# Patient Record
Sex: Male | Born: 1935 | Race: White | Hispanic: No | Marital: Married | Smoking: Current some day smoker
Health system: Southern US, Community
[De-identification: ages and names within clinical notes are randomized; demographics above are authoritative.]

## PROBLEM LIST (undated history)

## (undated) DIAGNOSIS — I8390 Asymptomatic varicose veins of unspecified lower extremity: Secondary | ICD-10-CM

## (undated) DIAGNOSIS — M1990 Unspecified osteoarthritis, unspecified site: Secondary | ICD-10-CM

## (undated) HISTORY — DX: Asymptomatic varicose veins of unspecified lower extremity: I83.90

## (undated) HISTORY — DX: Unspecified osteoarthritis, unspecified site: M19.90

## (undated) HISTORY — PX: INNER EAR SURGERY: SHX679

## (undated) HISTORY — PX: SQUAMOUS CELL CARCINOMA EXCISION: SHX2433

---

## 2000-11-09 ENCOUNTER — Ambulatory Visit (HOSPITAL_COMMUNITY): Admission: RE | Admit: 2000-11-09 | Discharge: 2000-11-09 | Payer: Self-pay | Admitting: Gastroenterology

## 2000-11-09 ENCOUNTER — Encounter (INDEPENDENT_AMBULATORY_CARE_PROVIDER_SITE_OTHER): Payer: Self-pay | Admitting: Specialist

## 2004-07-05 ENCOUNTER — Ambulatory Visit (HOSPITAL_COMMUNITY): Admission: RE | Admit: 2004-07-05 | Discharge: 2004-07-05 | Payer: Self-pay | Admitting: Gastroenterology

## 2007-02-25 ENCOUNTER — Encounter: Admission: RE | Admit: 2007-02-25 | Discharge: 2007-02-25 | Payer: Self-pay | Admitting: Internal Medicine

## 2007-04-02 ENCOUNTER — Encounter: Admission: RE | Admit: 2007-04-02 | Discharge: 2007-04-02 | Payer: Self-pay | Admitting: Internal Medicine

## 2007-04-16 ENCOUNTER — Encounter: Admission: RE | Admit: 2007-04-16 | Discharge: 2007-04-16 | Payer: Self-pay | Admitting: Internal Medicine

## 2007-04-30 ENCOUNTER — Encounter: Admission: RE | Admit: 2007-04-30 | Discharge: 2007-04-30 | Payer: Self-pay | Admitting: Internal Medicine

## 2007-06-23 ENCOUNTER — Ambulatory Visit (HOSPITAL_COMMUNITY): Admission: RE | Admit: 2007-06-23 | Discharge: 2007-06-23 | Payer: Self-pay | Admitting: Neurosurgery

## 2011-01-16 ENCOUNTER — Other Ambulatory Visit: Payer: Self-pay | Admitting: Dermatology

## 2011-04-11 NOTE — Op Note (Signed)
NAME:  Daniel Moses, Daniel Moses                            ACCOUNT NO.:  [id number]   MEDICAL RECORD NO.:  [id number]                   PATIENT TYPE:  AMB   LOCATION:  [facility]                                 FACILITY:  [facility]   PHYSICIAN:  Bernette Redbird, M.D.                DATE OF BIRTH:  [birth year]   DATE OF PROCEDURE:  07/05/2004  DATE OF DISCHARGE:                                 OPERATIVE REPORT   PROCEDURE:  Colonoscopy.   ENDOSCOPIST:  Bernette Redbird, M.D.   INDICATIONS FOR PROCEDURE:  This is a 77 year old with a prior history of  colonic adenoma having been removed several years ago.   FINDINGS:  Normal exam.   DESCRIPTION OF PROCEDURE:  The nature, purpose and risks of the procedure  were familiar to the patient from prior examination and he provided written  consent.  Sedation was Fentanyl 25 mcg and Versed 5 mg IV without arrhythmia  or desaturation.  Digital exam of the prostate was normal. The Olympus adult  adjustable tension video colonoscope was advanced to the cecum using some  external abdominal compression to control looping. The cecum was identified  by clear visualization of the appendiceal orifice and pullback was then  performed.   The quality of the prep was excellent, so it is felt that all areas were  well seen.   This was a normal examination.  There was no evidence of any residual or  recurrent polyps nor any evidence of cancer, colitis, vascular malformations  or diverticulosis.  Retroflexion in the rectum was unremarkable as was re-  inspection of the rectum.   No biopsies were obtained.  The patient tolerated the procedure well and  there were no apparent complications.   IMPRESSION:  Prior history of colonic adenoma without abnormalities on  current examination (V12.72).   PLAN:  Follow-up colonoscopy in about five years.                                               Bernette Redbird, M.D.    RB/MEDQ  D:  07/05/2004  T:  07/05/2004  Job:  [id number]   cc:   Loraine Leriche A. Waynard Edwards, M.D.  [address]  [place]  [place] [address]  Fax: [phone]

## 2011-04-11 NOTE — Procedures (Signed)
[place]  Patient:    Daniel Moses, Daniel Moses                         MRN: [id number] Proc. Date: 11/09/00 Adm. Date:  [id number] Attending:  Rich Brave CC:         Georg Ruddle. Viviann Spare, M.D.  Rodrigo Ran, M.D.   Procedure Report  PROCEDURE:  Colonoscopy with biopsies.  ENDOSCOPIST:  Florencia Reasons, M.D.  INDICATIONS: A 77 year old for colon cancer screening.  FINDINGS:  Diminutive polyps above cecum, biopsied.  INFORMED CONSENT:  The nature, purpose, and risks of the procedure have been discussed with the patient who provided written consent.  SEDATION:  Fentanyl 30 mcg and Versed 5 mg IV without arrhythmias or desaturation.  On digital exam the prostate was unremarkable.  The Olympus Adult Video colonoscope was advanced through the terminal ileum applying a small amount of external abdominal compression to minimize looping.  The terminal ileum was entered for a very short distance and a brief glimpse of it was obtained and it looked normal.  Pullback was then performed.  The quality of the prep was excellent and it was felt that all areas were well seen.  There were a couple of tiny, hyperplastic appearing polyps in the proximal ascending colon a short distance above the cecum, each removed by 1 or 2 cold biopsies.  The remainder of the colon was normal without any large polyps, cancer, colitis, vascular malformations or diverticulosis and retroflexion of the rectum was normal.  The quality of the prep was excellent.  The patient tolerated the procedure well and there were no apparent complications.  IMPRESSION:  Normal exam except for diminutive hyperplastic-appearing polyps in the proximal ascending colon.  PLAN:  Await pathology on biopsies with consideration of followup colonoscope in 3-5 years depending on histologic findings. DD:  11/09/00 TD:  11/09/00 Job: [id number]

## 2011-05-21 ENCOUNTER — Other Ambulatory Visit: Payer: Self-pay | Admitting: Dermatology

## 2011-12-10 DIAGNOSIS — E785 Hyperlipidemia, unspecified: Secondary | ICD-10-CM | POA: Diagnosis not present

## 2011-12-10 DIAGNOSIS — Z79899 Other long term (current) drug therapy: Secondary | ICD-10-CM | POA: Diagnosis not present

## 2011-12-10 DIAGNOSIS — E119 Type 2 diabetes mellitus without complications: Secondary | ICD-10-CM | POA: Diagnosis not present

## 2011-12-10 DIAGNOSIS — I10 Essential (primary) hypertension: Secondary | ICD-10-CM | POA: Diagnosis not present

## 2011-12-10 DIAGNOSIS — Z125 Encounter for screening for malignant neoplasm of prostate: Secondary | ICD-10-CM | POA: Diagnosis not present

## 2011-12-17 DIAGNOSIS — Z125 Encounter for screening for malignant neoplasm of prostate: Secondary | ICD-10-CM | POA: Diagnosis not present

## 2011-12-17 DIAGNOSIS — Z0000 Encounter for general adult medical examination without abnormal findings: Secondary | ICD-10-CM | POA: Diagnosis not present

## 2011-12-17 DIAGNOSIS — I10 Essential (primary) hypertension: Secondary | ICD-10-CM | POA: Diagnosis not present

## 2011-12-17 DIAGNOSIS — E119 Type 2 diabetes mellitus without complications: Secondary | ICD-10-CM | POA: Diagnosis not present

## 2011-12-17 DIAGNOSIS — I440 Atrioventricular block, first degree: Secondary | ICD-10-CM | POA: Diagnosis not present

## 2011-12-19 DIAGNOSIS — Z1212 Encounter for screening for malignant neoplasm of rectum: Secondary | ICD-10-CM | POA: Diagnosis not present

## 2012-02-24 DIAGNOSIS — H40019 Open angle with borderline findings, low risk, unspecified eye: Secondary | ICD-10-CM | POA: Diagnosis not present

## 2012-08-23 DIAGNOSIS — H40019 Open angle with borderline findings, low risk, unspecified eye: Secondary | ICD-10-CM | POA: Diagnosis not present

## 2012-08-23 DIAGNOSIS — H52209 Unspecified astigmatism, unspecified eye: Secondary | ICD-10-CM | POA: Diagnosis not present

## 2012-09-16 DIAGNOSIS — M1710 Unilateral primary osteoarthritis, unspecified knee: Secondary | ICD-10-CM | POA: Diagnosis not present

## 2012-09-16 DIAGNOSIS — M169 Osteoarthritis of hip, unspecified: Secondary | ICD-10-CM | POA: Diagnosis not present

## 2012-11-12 DIAGNOSIS — M1710 Unilateral primary osteoarthritis, unspecified knee: Secondary | ICD-10-CM | POA: Diagnosis not present

## 2013-02-21 DIAGNOSIS — I10 Essential (primary) hypertension: Secondary | ICD-10-CM | POA: Diagnosis not present

## 2013-02-21 DIAGNOSIS — Z125 Encounter for screening for malignant neoplasm of prostate: Secondary | ICD-10-CM | POA: Diagnosis not present

## 2013-02-21 DIAGNOSIS — H40019 Open angle with borderline findings, low risk, unspecified eye: Secondary | ICD-10-CM | POA: Diagnosis not present

## 2013-02-21 DIAGNOSIS — Z79899 Other long term (current) drug therapy: Secondary | ICD-10-CM | POA: Diagnosis not present

## 2013-02-21 DIAGNOSIS — E119 Type 2 diabetes mellitus without complications: Secondary | ICD-10-CM | POA: Diagnosis not present

## 2013-02-21 DIAGNOSIS — E785 Hyperlipidemia, unspecified: Secondary | ICD-10-CM | POA: Diagnosis not present

## 2013-02-28 DIAGNOSIS — M1990 Unspecified osteoarthritis, unspecified site: Secondary | ICD-10-CM | POA: Diagnosis not present

## 2013-02-28 DIAGNOSIS — Z79899 Other long term (current) drug therapy: Secondary | ICD-10-CM | POA: Diagnosis not present

## 2013-02-28 DIAGNOSIS — Z0000 Encounter for general adult medical examination without abnormal findings: Secondary | ICD-10-CM | POA: Diagnosis not present

## 2013-02-28 DIAGNOSIS — Z125 Encounter for screening for malignant neoplasm of prostate: Secondary | ICD-10-CM | POA: Diagnosis not present

## 2013-02-28 DIAGNOSIS — Z23 Encounter for immunization: Secondary | ICD-10-CM | POA: Diagnosis not present

## 2013-02-28 DIAGNOSIS — Z1331 Encounter for screening for depression: Secondary | ICD-10-CM | POA: Diagnosis not present

## 2013-02-28 DIAGNOSIS — I440 Atrioventricular block, first degree: Secondary | ICD-10-CM | POA: Diagnosis not present

## 2013-02-28 DIAGNOSIS — D126 Benign neoplasm of colon, unspecified: Secondary | ICD-10-CM | POA: Diagnosis not present

## 2013-02-28 DIAGNOSIS — IMO0002 Reserved for concepts with insufficient information to code with codable children: Secondary | ICD-10-CM | POA: Diagnosis not present

## 2013-02-28 DIAGNOSIS — E669 Obesity, unspecified: Secondary | ICD-10-CM | POA: Diagnosis not present

## 2013-02-28 DIAGNOSIS — E119 Type 2 diabetes mellitus without complications: Secondary | ICD-10-CM | POA: Diagnosis not present

## 2013-02-28 DIAGNOSIS — E785 Hyperlipidemia, unspecified: Secondary | ICD-10-CM | POA: Diagnosis not present

## 2013-03-02 DIAGNOSIS — M1710 Unilateral primary osteoarthritis, unspecified knee: Secondary | ICD-10-CM | POA: Diagnosis not present

## 2013-03-15 DIAGNOSIS — H6120 Impacted cerumen, unspecified ear: Secondary | ICD-10-CM | POA: Diagnosis not present

## 2013-04-05 DIAGNOSIS — M1710 Unilateral primary osteoarthritis, unspecified knee: Secondary | ICD-10-CM | POA: Diagnosis not present

## 2013-04-21 DIAGNOSIS — M1710 Unilateral primary osteoarthritis, unspecified knee: Secondary | ICD-10-CM | POA: Diagnosis not present

## 2013-04-27 DIAGNOSIS — M1710 Unilateral primary osteoarthritis, unspecified knee: Secondary | ICD-10-CM | POA: Diagnosis not present

## 2013-05-04 DIAGNOSIS — M1710 Unilateral primary osteoarthritis, unspecified knee: Secondary | ICD-10-CM | POA: Diagnosis not present

## 2013-06-16 DIAGNOSIS — M1710 Unilateral primary osteoarthritis, unspecified knee: Secondary | ICD-10-CM | POA: Diagnosis not present

## 2013-08-02 ENCOUNTER — Encounter: Payer: Self-pay | Admitting: Vascular Surgery

## 2013-08-03 ENCOUNTER — Ambulatory Visit (INDEPENDENT_AMBULATORY_CARE_PROVIDER_SITE_OTHER): Payer: Medicare Other | Admitting: Vascular Surgery

## 2013-08-03 ENCOUNTER — Encounter: Payer: Self-pay | Admitting: Vascular Surgery

## 2013-08-03 ENCOUNTER — Encounter (INDEPENDENT_AMBULATORY_CARE_PROVIDER_SITE_OTHER): Payer: Medicare Other | Admitting: Vascular Surgery

## 2013-08-03 ENCOUNTER — Other Ambulatory Visit: Payer: Self-pay | Admitting: *Deleted

## 2013-08-03 VITALS — BP 141/75 | HR 51 | Resp 18 | Ht 70.0 in | Wt 198.3 lb

## 2013-08-03 DIAGNOSIS — I83893 Varicose veins of bilateral lower extremities with other complications: Secondary | ICD-10-CM

## 2013-08-03 DIAGNOSIS — M79609 Pain in unspecified limb: Secondary | ICD-10-CM

## 2013-08-03 HISTORY — DX: Varicose veins of bilateral lower extremities with other complications: I83.893

## 2013-08-03 NOTE — Progress Notes (Signed)
Vascular and Vein Specialist of [place]  Patient name: Daniel Moses MRN: [id number] DOB: Oct 20, 1934 Sex: male  REASON FOR CONSULT: bilateral varicose veins.  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 He has a long history of aching pain in both lower extremities associated with sitting and standing. He also has some problems with swelling associated with his varicosities. He is unaware of any history of DVT or phlebitis in the past. Since his retirement, he was noted that his symptoms have become more prominent. He has worn knee-high compression stockings which didn't help his symptoms on the left leg especially.  Past Medical History  Diagnosis Date  . Varicose veins   . Arthritis     back and knees   Family History  Problem Relation Age of Onset  . Hypertension Mother   . Other Mother     varicose veins  . Cancer Father    SOCIAL HISTORY: History  Substance Use Topics  . Smoking status: Current Some Day Smoker    Types: Cigars  . Smokeless tobacco: Never Used  . Alcohol Use: No   No Known Allergies  Current Outpatient Prescriptions  Medication Sig Dispense Refill  . aspirin 81 MG tablet Take 81 mg by mouth daily.      . finasteride (PROSCAR) 5 MG tablet Take 5 mg by mouth daily.      . naproxen sodium (ANAPROX) 220 MG tablet Take 220 mg by mouth as needed.      . simvastatin (ZOCOR) 40 MG tablet Take 40 mg by mouth every evening.      . tamsulosin (FLOMAX) 0.4 MG CAPS capsule Take by mouth.       No current facility-administered medications for this visit.   REVIEW OF SYSTEMS: Arly.Keller ] denotes positive finding; [  ] denotes negative finding  CARDIOVASCULAR:  [ ]  chest pain   [ ]  chest pressure   [ ]  palpitations   [ ]  orthopnea   [ ]  dyspnea on exertion   [ ]  claudication   [ ]  rest pain   [ ]  DVT   [ ]  phlebitis PULMONARY:   [ ]  productive cough    [ ]  asthma   [ ]  wheezing NEUROLOGIC:   [ ]  weakness  [ ]  paresthesias  [ ]  aphasia  [ ]  amaurosis  [ ]  dizziness HEMATOLOGIC:   [ ]  bleeding problems   [ ]  clotting disorders MUSCULOSKELETAL:  [ ]  joint pain   [ ]  joint swelling Arly.Keller ] leg swelling GASTROINTESTINAL: [ ]   blood in stool  [ ]   hematemesis GENITOURINARY:  [ ]   dysuria  [ ]   hematuria PSYCHIATRIC:  [ ]  history of major depression INTEGUMENTARY:  [ ]  rashes  [ ]  ulcers CONSTITUTIONAL:  [ ]  fever   [ ]  chills  PHYSICAL EXAM: Filed Vitals:   08/03/13 1219  BP: 141/75  Pulse: 51  Resp: 18  Height: 5\' 10"  (1.778 m)  Weight: 198 lb 4.8 oz (89.948 kg)   Body mass index is 28.45 kg/(m^2). GENERAL: The patient is a well-nourished male, in no acute distress. The vital signs are documented above. CARDIOVASCULAR: There is a regular rate and rhythm. I do not detect carotid bruits. He has palpable pedal pulses. PULMONARY: There is good air exchange bilaterally without wheezing or rales. ABDOMEN: Soft and non-tender  with normal pitched bowel sounds.  MUSCULOSKELETAL: There are no major deformities or cyanosis. NEUROLOGIC: No focal weakness or paresthesias are detected. SKIN: There are no ulcers or rashes noted. He has some telangiectasias bilaterally. PSYCHIATRIC: The patient has a normal affect.  DATA:  I have independently interpreted his venous duplex scan. On the right side, he does have some deep vein reflux in the superficial femoral vein and popliteal vein. He also has some reflux in the greater saphenous vein in the right side. On the left side, he does have a couple areas of venous reflux in the saphenous vein. There is no deep vein reflux on the left. There is no evidence of DVT in either lower extremity.  MEDICAL ISSUES: This patient has bilateral lower extremity varicose veins which are symptomatic. He does wear knee-high compression stockings which have helped. We have also discussed the importance of intermittent leg  elevation. I've encouraged him to stay as active as possible and to avoid prolonged sitting and standing. If his symptoms progress he will let us know we can try a thigh high compression stockings to see if this makes a difference. If his symptoms progress he could potentially be considered for laser ablated of his right greater saphenous vein.  Keitra Carusone S Vascular [facility] Vein Specialists of  [place]: [phone]

## 2013-08-11 DIAGNOSIS — L570 Actinic keratosis: Secondary | ICD-10-CM | POA: Diagnosis not present

## 2013-08-11 DIAGNOSIS — M7130 Other bursal cyst, unspecified site: Secondary | ICD-10-CM | POA: Diagnosis not present

## 2013-08-11 DIAGNOSIS — B351 Tinea unguium: Secondary | ICD-10-CM | POA: Diagnosis not present

## 2013-08-15 DIAGNOSIS — Z23 Encounter for immunization: Secondary | ICD-10-CM | POA: Diagnosis not present

## 2013-09-09 DIAGNOSIS — B351 Tinea unguium: Secondary | ICD-10-CM | POA: Diagnosis not present

## 2013-09-09 DIAGNOSIS — Z79899 Other long term (current) drug therapy: Secondary | ICD-10-CM | POA: Diagnosis not present

## 2013-11-04 DIAGNOSIS — H52209 Unspecified astigmatism, unspecified eye: Secondary | ICD-10-CM | POA: Diagnosis not present

## 2013-11-04 DIAGNOSIS — H2510 Age-related nuclear cataract, unspecified eye: Secondary | ICD-10-CM | POA: Diagnosis not present

## 2013-11-04 DIAGNOSIS — H40019 Open angle with borderline findings, low risk, unspecified eye: Secondary | ICD-10-CM | POA: Diagnosis not present

## 2014-02-07 DIAGNOSIS — D235 Other benign neoplasm of skin of trunk: Secondary | ICD-10-CM | POA: Diagnosis not present

## 2014-02-07 DIAGNOSIS — L819 Disorder of pigmentation, unspecified: Secondary | ICD-10-CM | POA: Diagnosis not present

## 2014-02-07 DIAGNOSIS — L820 Inflamed seborrheic keratosis: Secondary | ICD-10-CM | POA: Diagnosis not present

## 2014-02-07 DIAGNOSIS — L570 Actinic keratosis: Secondary | ICD-10-CM | POA: Diagnosis not present

## 2014-02-07 DIAGNOSIS — D1801 Hemangioma of skin and subcutaneous tissue: Secondary | ICD-10-CM | POA: Diagnosis not present

## 2014-03-07 DIAGNOSIS — I10 Essential (primary) hypertension: Secondary | ICD-10-CM | POA: Diagnosis not present

## 2014-03-07 DIAGNOSIS — E785 Hyperlipidemia, unspecified: Secondary | ICD-10-CM | POA: Diagnosis not present

## 2014-03-07 DIAGNOSIS — E119 Type 2 diabetes mellitus without complications: Secondary | ICD-10-CM | POA: Diagnosis not present

## 2014-03-07 DIAGNOSIS — Z125 Encounter for screening for malignant neoplasm of prostate: Secondary | ICD-10-CM | POA: Diagnosis not present

## 2014-03-14 DIAGNOSIS — Z0000 Encounter for general adult medical examination without abnormal findings: Secondary | ICD-10-CM | POA: Diagnosis not present

## 2014-03-14 DIAGNOSIS — J301 Allergic rhinitis due to pollen: Secondary | ICD-10-CM | POA: Diagnosis not present

## 2014-03-14 DIAGNOSIS — E119 Type 2 diabetes mellitus without complications: Secondary | ICD-10-CM | POA: Diagnosis not present

## 2014-03-14 DIAGNOSIS — M1990 Unspecified osteoarthritis, unspecified site: Secondary | ICD-10-CM | POA: Diagnosis not present

## 2014-03-14 DIAGNOSIS — Z125 Encounter for screening for malignant neoplasm of prostate: Secondary | ICD-10-CM | POA: Diagnosis not present

## 2014-03-14 DIAGNOSIS — E669 Obesity, unspecified: Secondary | ICD-10-CM | POA: Diagnosis not present

## 2014-03-14 DIAGNOSIS — IMO0002 Reserved for concepts with insufficient information to code with codable children: Secondary | ICD-10-CM | POA: Diagnosis not present

## 2014-03-14 DIAGNOSIS — I440 Atrioventricular block, first degree: Secondary | ICD-10-CM | POA: Diagnosis not present

## 2014-03-14 DIAGNOSIS — E785 Hyperlipidemia, unspecified: Secondary | ICD-10-CM | POA: Diagnosis not present

## 2014-03-14 DIAGNOSIS — Z6827 Body mass index (BMI) 27.0-27.9, adult: Secondary | ICD-10-CM | POA: Diagnosis not present

## 2014-08-23 DIAGNOSIS — Z23 Encounter for immunization: Secondary | ICD-10-CM | POA: Diagnosis not present

## 2014-09-05 DIAGNOSIS — L821 Other seborrheic keratosis: Secondary | ICD-10-CM | POA: Diagnosis not present

## 2014-09-05 DIAGNOSIS — L570 Actinic keratosis: Secondary | ICD-10-CM | POA: Diagnosis not present

## 2014-10-03 DIAGNOSIS — H40013 Open angle with borderline findings, low risk, bilateral: Secondary | ICD-10-CM | POA: Diagnosis not present

## 2014-10-03 DIAGNOSIS — H5213 Myopia, bilateral: Secondary | ICD-10-CM | POA: Diagnosis not present

## 2014-11-13 DIAGNOSIS — C44622 Squamous cell carcinoma of skin of right upper limb, including shoulder: Secondary | ICD-10-CM | POA: Diagnosis not present

## 2014-11-13 DIAGNOSIS — C44692 Other specified malignant neoplasm of skin of right upper limb, including shoulder: Secondary | ICD-10-CM | POA: Diagnosis not present

## 2015-01-24 DIAGNOSIS — Z08 Encounter for follow-up examination after completed treatment for malignant neoplasm: Secondary | ICD-10-CM | POA: Diagnosis not present

## 2015-01-24 DIAGNOSIS — L570 Actinic keratosis: Secondary | ICD-10-CM | POA: Diagnosis not present

## 2015-01-24 DIAGNOSIS — Z85828 Personal history of other malignant neoplasm of skin: Secondary | ICD-10-CM | POA: Diagnosis not present

## 2015-03-16 DIAGNOSIS — E785 Hyperlipidemia, unspecified: Secondary | ICD-10-CM | POA: Diagnosis not present

## 2015-03-16 DIAGNOSIS — E119 Type 2 diabetes mellitus without complications: Secondary | ICD-10-CM | POA: Diagnosis not present

## 2015-03-16 DIAGNOSIS — I10 Essential (primary) hypertension: Secondary | ICD-10-CM | POA: Diagnosis not present

## 2015-03-16 DIAGNOSIS — Z125 Encounter for screening for malignant neoplasm of prostate: Secondary | ICD-10-CM | POA: Diagnosis not present

## 2015-04-12 DIAGNOSIS — Z1212 Encounter for screening for malignant neoplasm of rectum: Secondary | ICD-10-CM | POA: Diagnosis not present

## 2015-06-14 DIAGNOSIS — L570 Actinic keratosis: Secondary | ICD-10-CM | POA: Diagnosis not present

## 2015-06-15 DIAGNOSIS — M1712 Unilateral primary osteoarthritis, left knee: Secondary | ICD-10-CM | POA: Diagnosis not present

## 2015-06-21 DIAGNOSIS — Z09 Encounter for follow-up examination after completed treatment for conditions other than malignant neoplasm: Secondary | ICD-10-CM | POA: Diagnosis not present

## 2015-06-21 DIAGNOSIS — K5730 Diverticulosis of large intestine without perforation or abscess without bleeding: Secondary | ICD-10-CM | POA: Diagnosis not present

## 2015-06-21 DIAGNOSIS — Z86010 Personal history of colonic polyps: Secondary | ICD-10-CM | POA: Diagnosis not present

## 2015-07-04 DIAGNOSIS — Z6828 Body mass index (BMI) 28.0-28.9, adult: Secondary | ICD-10-CM | POA: Diagnosis not present

## 2015-07-04 DIAGNOSIS — Z0000 Encounter for general adult medical examination without abnormal findings: Secondary | ICD-10-CM | POA: Diagnosis not present

## 2015-07-04 DIAGNOSIS — D692 Other nonthrombocytopenic purpura: Secondary | ICD-10-CM | POA: Diagnosis not present

## 2015-07-04 DIAGNOSIS — M5380 Other specified dorsopathies, site unspecified: Secondary | ICD-10-CM | POA: Diagnosis not present

## 2015-07-04 DIAGNOSIS — H6123 Impacted cerumen, bilateral: Secondary | ICD-10-CM | POA: Diagnosis not present

## 2015-07-04 DIAGNOSIS — M25569 Pain in unspecified knee: Secondary | ICD-10-CM | POA: Diagnosis not present

## 2015-07-04 DIAGNOSIS — E119 Type 2 diabetes mellitus without complications: Secondary | ICD-10-CM | POA: Diagnosis not present

## 2015-07-04 DIAGNOSIS — M1990 Unspecified osteoarthritis, unspecified site: Secondary | ICD-10-CM | POA: Diagnosis not present

## 2015-07-04 DIAGNOSIS — I440 Atrioventricular block, first degree: Secondary | ICD-10-CM | POA: Diagnosis not present

## 2015-07-04 DIAGNOSIS — E785 Hyperlipidemia, unspecified: Secondary | ICD-10-CM | POA: Diagnosis not present

## 2015-07-04 DIAGNOSIS — I10 Essential (primary) hypertension: Secondary | ICD-10-CM | POA: Diagnosis not present

## 2015-07-04 DIAGNOSIS — Z1389 Encounter for screening for other disorder: Secondary | ICD-10-CM | POA: Diagnosis not present

## 2015-08-01 DIAGNOSIS — M1712 Unilateral primary osteoarthritis, left knee: Secondary | ICD-10-CM | POA: Diagnosis not present

## 2015-08-08 DIAGNOSIS — M1712 Unilateral primary osteoarthritis, left knee: Secondary | ICD-10-CM | POA: Diagnosis not present

## 2015-08-15 DIAGNOSIS — Z23 Encounter for immunization: Secondary | ICD-10-CM | POA: Diagnosis not present

## 2015-08-17 DIAGNOSIS — M1712 Unilateral primary osteoarthritis, left knee: Secondary | ICD-10-CM | POA: Diagnosis not present

## 2015-11-06 DIAGNOSIS — L570 Actinic keratosis: Secondary | ICD-10-CM | POA: Diagnosis not present

## 2015-11-06 DIAGNOSIS — I788 Other diseases of capillaries: Secondary | ICD-10-CM | POA: Diagnosis not present

## 2015-12-25 DIAGNOSIS — H40012 Open angle with borderline findings, low risk, left eye: Secondary | ICD-10-CM | POA: Diagnosis not present

## 2015-12-25 DIAGNOSIS — H40011 Open angle with borderline findings, low risk, right eye: Secondary | ICD-10-CM | POA: Diagnosis not present

## 2015-12-25 DIAGNOSIS — H2513 Age-related nuclear cataract, bilateral: Secondary | ICD-10-CM | POA: Diagnosis not present

## 2016-01-23 DIAGNOSIS — L820 Inflamed seborrheic keratosis: Secondary | ICD-10-CM | POA: Diagnosis not present

## 2016-01-23 DIAGNOSIS — L821 Other seborrheic keratosis: Secondary | ICD-10-CM | POA: Diagnosis not present

## 2016-06-20 DIAGNOSIS — L820 Inflamed seborrheic keratosis: Secondary | ICD-10-CM | POA: Diagnosis not present

## 2016-06-20 DIAGNOSIS — L570 Actinic keratosis: Secondary | ICD-10-CM | POA: Diagnosis not present

## 2016-06-20 DIAGNOSIS — E119 Type 2 diabetes mellitus without complications: Secondary | ICD-10-CM | POA: Diagnosis not present

## 2016-07-22 DIAGNOSIS — I10 Essential (primary) hypertension: Secondary | ICD-10-CM | POA: Diagnosis not present

## 2016-07-22 DIAGNOSIS — E119 Type 2 diabetes mellitus without complications: Secondary | ICD-10-CM | POA: Diagnosis not present

## 2016-07-22 DIAGNOSIS — Z125 Encounter for screening for malignant neoplasm of prostate: Secondary | ICD-10-CM | POA: Diagnosis not present

## 2016-07-22 DIAGNOSIS — E784 Other hyperlipidemia: Secondary | ICD-10-CM | POA: Diagnosis not present

## 2016-07-29 DIAGNOSIS — E784 Other hyperlipidemia: Secondary | ICD-10-CM | POA: Diagnosis not present

## 2016-07-29 DIAGNOSIS — M25561 Pain in right knee: Secondary | ICD-10-CM | POA: Diagnosis not present

## 2016-07-29 DIAGNOSIS — H6123 Impacted cerumen, bilateral: Secondary | ICD-10-CM | POA: Diagnosis not present

## 2016-07-29 DIAGNOSIS — Z6829 Body mass index (BMI) 29.0-29.9, adult: Secondary | ICD-10-CM | POA: Diagnosis not present

## 2016-07-29 DIAGNOSIS — M25562 Pain in left knee: Secondary | ICD-10-CM | POA: Diagnosis not present

## 2016-07-29 DIAGNOSIS — I10 Essential (primary) hypertension: Secondary | ICD-10-CM | POA: Diagnosis not present

## 2016-07-29 DIAGNOSIS — Z1389 Encounter for screening for other disorder: Secondary | ICD-10-CM | POA: Diagnosis not present

## 2016-07-29 DIAGNOSIS — Z0000 Encounter for general adult medical examination without abnormal findings: Secondary | ICD-10-CM | POA: Diagnosis not present

## 2016-07-29 DIAGNOSIS — I440 Atrioventricular block, first degree: Secondary | ICD-10-CM | POA: Diagnosis not present

## 2016-07-29 DIAGNOSIS — E668 Other obesity: Secondary | ICD-10-CM | POA: Diagnosis not present

## 2016-07-29 DIAGNOSIS — N401 Enlarged prostate with lower urinary tract symptoms: Secondary | ICD-10-CM | POA: Diagnosis not present

## 2016-07-29 DIAGNOSIS — E119 Type 2 diabetes mellitus without complications: Secondary | ICD-10-CM | POA: Diagnosis not present

## 2016-08-01 DIAGNOSIS — C44722 Squamous cell carcinoma of skin of right lower limb, including hip: Secondary | ICD-10-CM | POA: Diagnosis not present

## 2016-08-01 DIAGNOSIS — D485 Neoplasm of uncertain behavior of skin: Secondary | ICD-10-CM | POA: Diagnosis not present

## 2016-08-01 DIAGNOSIS — L821 Other seborrheic keratosis: Secondary | ICD-10-CM | POA: Diagnosis not present

## 2016-08-05 DIAGNOSIS — C44722 Squamous cell carcinoma of skin of right lower limb, including hip: Secondary | ICD-10-CM | POA: Diagnosis not present

## 2017-01-28 ENCOUNTER — Other Ambulatory Visit: Payer: Self-pay | Admitting: Dermatology

## 2017-01-28 DIAGNOSIS — C44519 Basal cell carcinoma of skin of other part of trunk: Secondary | ICD-10-CM | POA: Diagnosis not present

## 2017-01-28 DIAGNOSIS — D485 Neoplasm of uncertain behavior of skin: Secondary | ICD-10-CM | POA: Diagnosis not present

## 2017-01-28 DIAGNOSIS — C44619 Basal cell carcinoma of skin of left upper limb, including shoulder: Secondary | ICD-10-CM | POA: Diagnosis not present

## 2017-01-28 DIAGNOSIS — D225 Melanocytic nevi of trunk: Secondary | ICD-10-CM | POA: Diagnosis not present

## 2017-01-28 DIAGNOSIS — L570 Actinic keratosis: Secondary | ICD-10-CM | POA: Diagnosis not present

## 2017-01-28 DIAGNOSIS — Z85828 Personal history of other malignant neoplasm of skin: Secondary | ICD-10-CM | POA: Diagnosis not present

## 2017-01-28 DIAGNOSIS — L821 Other seborrheic keratosis: Secondary | ICD-10-CM | POA: Diagnosis not present

## 2017-01-28 DIAGNOSIS — L814 Other melanin hyperpigmentation: Secondary | ICD-10-CM | POA: Diagnosis not present

## 2017-03-18 DIAGNOSIS — M1712 Unilateral primary osteoarthritis, left knee: Secondary | ICD-10-CM | POA: Diagnosis not present

## 2017-04-29 DIAGNOSIS — M1712 Unilateral primary osteoarthritis, left knee: Secondary | ICD-10-CM | POA: Diagnosis not present

## 2017-05-07 DIAGNOSIS — M1712 Unilateral primary osteoarthritis, left knee: Secondary | ICD-10-CM | POA: Diagnosis not present

## 2017-05-20 DIAGNOSIS — M1712 Unilateral primary osteoarthritis, left knee: Secondary | ICD-10-CM | POA: Diagnosis not present

## 2017-07-29 DIAGNOSIS — D225 Melanocytic nevi of trunk: Secondary | ICD-10-CM | POA: Diagnosis not present

## 2017-07-29 DIAGNOSIS — D1801 Hemangioma of skin and subcutaneous tissue: Secondary | ICD-10-CM | POA: Diagnosis not present

## 2017-07-29 DIAGNOSIS — L821 Other seborrheic keratosis: Secondary | ICD-10-CM | POA: Diagnosis not present

## 2017-07-29 DIAGNOSIS — Z85828 Personal history of other malignant neoplasm of skin: Secondary | ICD-10-CM | POA: Diagnosis not present

## 2017-07-29 DIAGNOSIS — L814 Other melanin hyperpigmentation: Secondary | ICD-10-CM | POA: Diagnosis not present

## 2017-07-29 DIAGNOSIS — L570 Actinic keratosis: Secondary | ICD-10-CM | POA: Diagnosis not present

## 2017-08-25 DIAGNOSIS — Z23 Encounter for immunization: Secondary | ICD-10-CM | POA: Diagnosis not present

## 2017-09-14 DIAGNOSIS — Z125 Encounter for screening for malignant neoplasm of prostate: Secondary | ICD-10-CM | POA: Diagnosis not present

## 2017-09-14 DIAGNOSIS — I10 Essential (primary) hypertension: Secondary | ICD-10-CM | POA: Diagnosis not present

## 2017-09-14 DIAGNOSIS — E119 Type 2 diabetes mellitus without complications: Secondary | ICD-10-CM | POA: Diagnosis not present

## 2017-09-14 DIAGNOSIS — E7849 Other hyperlipidemia: Secondary | ICD-10-CM | POA: Diagnosis not present

## 2017-09-14 DIAGNOSIS — Z0000 Encounter for general adult medical examination without abnormal findings: Secondary | ICD-10-CM | POA: Diagnosis not present

## 2017-09-21 DIAGNOSIS — Z1389 Encounter for screening for other disorder: Secondary | ICD-10-CM | POA: Diagnosis not present

## 2017-09-21 DIAGNOSIS — M25561 Pain in right knee: Secondary | ICD-10-CM | POA: Diagnosis not present

## 2017-09-21 DIAGNOSIS — E7849 Other hyperlipidemia: Secondary | ICD-10-CM | POA: Diagnosis not present

## 2017-09-21 DIAGNOSIS — H6123 Impacted cerumen, bilateral: Secondary | ICD-10-CM | POA: Diagnosis not present

## 2017-09-21 DIAGNOSIS — M5380 Other specified dorsopathies, site unspecified: Secondary | ICD-10-CM | POA: Diagnosis not present

## 2017-09-21 DIAGNOSIS — Z0000 Encounter for general adult medical examination without abnormal findings: Secondary | ICD-10-CM | POA: Diagnosis not present

## 2017-09-21 DIAGNOSIS — Z6829 Body mass index (BMI) 29.0-29.9, adult: Secondary | ICD-10-CM | POA: Diagnosis not present

## 2017-09-21 DIAGNOSIS — E119 Type 2 diabetes mellitus without complications: Secondary | ICD-10-CM | POA: Diagnosis not present

## 2017-09-21 DIAGNOSIS — M25562 Pain in left knee: Secondary | ICD-10-CM | POA: Diagnosis not present

## 2017-09-21 DIAGNOSIS — N401 Enlarged prostate with lower urinary tract symptoms: Secondary | ICD-10-CM | POA: Diagnosis not present

## 2017-09-21 DIAGNOSIS — I440 Atrioventricular block, first degree: Secondary | ICD-10-CM | POA: Diagnosis not present

## 2017-09-21 DIAGNOSIS — E668 Other obesity: Secondary | ICD-10-CM | POA: Diagnosis not present

## 2017-09-22 DIAGNOSIS — C44519 Basal cell carcinoma of skin of other part of trunk: Secondary | ICD-10-CM | POA: Diagnosis not present

## 2017-09-22 DIAGNOSIS — Z85828 Personal history of other malignant neoplasm of skin: Secondary | ICD-10-CM | POA: Diagnosis not present

## 2017-09-22 DIAGNOSIS — C44529 Squamous cell carcinoma of skin of other part of trunk: Secondary | ICD-10-CM | POA: Diagnosis not present

## 2017-09-22 DIAGNOSIS — L814 Other melanin hyperpigmentation: Secondary | ICD-10-CM | POA: Diagnosis not present

## 2017-09-22 DIAGNOSIS — D225 Melanocytic nevi of trunk: Secondary | ICD-10-CM | POA: Diagnosis not present

## 2017-09-22 DIAGNOSIS — L821 Other seborrheic keratosis: Secondary | ICD-10-CM | POA: Diagnosis not present

## 2017-10-01 DIAGNOSIS — Z1212 Encounter for screening for malignant neoplasm of rectum: Secondary | ICD-10-CM | POA: Diagnosis not present

## 2018-06-09 DIAGNOSIS — C44722 Squamous cell carcinoma of skin of right lower limb, including hip: Secondary | ICD-10-CM | POA: Diagnosis not present

## 2018-06-09 DIAGNOSIS — L570 Actinic keratosis: Secondary | ICD-10-CM | POA: Diagnosis not present

## 2018-06-09 DIAGNOSIS — B351 Tinea unguium: Secondary | ICD-10-CM | POA: Diagnosis not present

## 2018-06-09 DIAGNOSIS — D485 Neoplasm of uncertain behavior of skin: Secondary | ICD-10-CM | POA: Diagnosis not present

## 2018-07-28 DIAGNOSIS — L570 Actinic keratosis: Secondary | ICD-10-CM | POA: Diagnosis not present

## 2018-07-28 DIAGNOSIS — C44722 Squamous cell carcinoma of skin of right lower limb, including hip: Secondary | ICD-10-CM | POA: Diagnosis not present

## 2018-08-04 DIAGNOSIS — Z23 Encounter for immunization: Secondary | ICD-10-CM | POA: Diagnosis not present

## 2018-09-15 DIAGNOSIS — Z125 Encounter for screening for malignant neoplasm of prostate: Secondary | ICD-10-CM | POA: Diagnosis not present

## 2018-09-15 DIAGNOSIS — E7849 Other hyperlipidemia: Secondary | ICD-10-CM | POA: Diagnosis not present

## 2018-09-15 DIAGNOSIS — R82998 Other abnormal findings in urine: Secondary | ICD-10-CM | POA: Diagnosis not present

## 2018-09-15 DIAGNOSIS — E119 Type 2 diabetes mellitus without complications: Secondary | ICD-10-CM | POA: Diagnosis not present

## 2018-09-27 DIAGNOSIS — E119 Type 2 diabetes mellitus without complications: Secondary | ICD-10-CM | POA: Diagnosis not present

## 2018-09-27 DIAGNOSIS — Z0000 Encounter for general adult medical examination without abnormal findings: Secondary | ICD-10-CM | POA: Diagnosis not present

## 2018-09-27 DIAGNOSIS — D126 Benign neoplasm of colon, unspecified: Secondary | ICD-10-CM | POA: Diagnosis not present

## 2018-09-27 DIAGNOSIS — N401 Enlarged prostate with lower urinary tract symptoms: Secondary | ICD-10-CM | POA: Diagnosis not present

## 2018-09-27 DIAGNOSIS — D692 Other nonthrombocytopenic purpura: Secondary | ICD-10-CM | POA: Diagnosis not present

## 2018-09-27 DIAGNOSIS — E7849 Other hyperlipidemia: Secondary | ICD-10-CM | POA: Diagnosis not present

## 2018-09-27 DIAGNOSIS — Z1389 Encounter for screening for other disorder: Secondary | ICD-10-CM | POA: Diagnosis not present

## 2018-09-27 DIAGNOSIS — M5380 Other specified dorsopathies, site unspecified: Secondary | ICD-10-CM | POA: Diagnosis not present

## 2018-09-27 DIAGNOSIS — J302 Other seasonal allergic rhinitis: Secondary | ICD-10-CM | POA: Diagnosis not present

## 2018-09-27 DIAGNOSIS — I440 Atrioventricular block, first degree: Secondary | ICD-10-CM | POA: Diagnosis not present

## 2018-09-27 DIAGNOSIS — Z6830 Body mass index (BMI) 30.0-30.9, adult: Secondary | ICD-10-CM | POA: Diagnosis not present

## 2018-09-27 DIAGNOSIS — E668 Other obesity: Secondary | ICD-10-CM | POA: Diagnosis not present

## 2018-09-28 DIAGNOSIS — Z1212 Encounter for screening for malignant neoplasm of rectum: Secondary | ICD-10-CM | POA: Diagnosis not present

## 2018-10-11 DIAGNOSIS — L570 Actinic keratosis: Secondary | ICD-10-CM | POA: Diagnosis not present

## 2018-10-11 DIAGNOSIS — L905 Scar conditions and fibrosis of skin: Secondary | ICD-10-CM | POA: Diagnosis not present

## 2018-10-11 DIAGNOSIS — Z85828 Personal history of other malignant neoplasm of skin: Secondary | ICD-10-CM | POA: Diagnosis not present

## 2018-12-09 DIAGNOSIS — L821 Other seborrheic keratosis: Secondary | ICD-10-CM | POA: Diagnosis not present

## 2018-12-09 DIAGNOSIS — C44722 Squamous cell carcinoma of skin of right lower limb, including hip: Secondary | ICD-10-CM | POA: Diagnosis not present

## 2018-12-09 DIAGNOSIS — Z85828 Personal history of other malignant neoplasm of skin: Secondary | ICD-10-CM | POA: Diagnosis not present

## 2018-12-09 DIAGNOSIS — D485 Neoplasm of uncertain behavior of skin: Secondary | ICD-10-CM | POA: Diagnosis not present

## 2018-12-09 DIAGNOSIS — L570 Actinic keratosis: Secondary | ICD-10-CM | POA: Diagnosis not present

## 2019-01-07 DIAGNOSIS — C44722 Squamous cell carcinoma of skin of right lower limb, including hip: Secondary | ICD-10-CM | POA: Diagnosis not present

## 2019-05-12 DIAGNOSIS — Z85828 Personal history of other malignant neoplasm of skin: Secondary | ICD-10-CM | POA: Diagnosis not present

## 2019-05-12 DIAGNOSIS — D485 Neoplasm of uncertain behavior of skin: Secondary | ICD-10-CM | POA: Diagnosis not present

## 2019-05-12 DIAGNOSIS — C44329 Squamous cell carcinoma of skin of other parts of face: Secondary | ICD-10-CM | POA: Diagnosis not present

## 2019-05-12 DIAGNOSIS — L570 Actinic keratosis: Secondary | ICD-10-CM | POA: Diagnosis not present

## 2019-05-12 DIAGNOSIS — L821 Other seborrheic keratosis: Secondary | ICD-10-CM | POA: Diagnosis not present

## 2019-05-12 DIAGNOSIS — D225 Melanocytic nevi of trunk: Secondary | ICD-10-CM | POA: Diagnosis not present

## 2019-06-20 DIAGNOSIS — D485 Neoplasm of uncertain behavior of skin: Secondary | ICD-10-CM | POA: Diagnosis not present

## 2019-06-20 DIAGNOSIS — L570 Actinic keratosis: Secondary | ICD-10-CM | POA: Diagnosis not present

## 2019-06-20 DIAGNOSIS — C44319 Basal cell carcinoma of skin of other parts of face: Secondary | ICD-10-CM | POA: Diagnosis not present

## 2019-06-28 DIAGNOSIS — C44329 Squamous cell carcinoma of skin of other parts of face: Secondary | ICD-10-CM | POA: Diagnosis not present

## 2019-07-14 DIAGNOSIS — Z23 Encounter for immunization: Secondary | ICD-10-CM | POA: Diagnosis not present

## 2019-07-19 DIAGNOSIS — C44319 Basal cell carcinoma of skin of other parts of face: Secondary | ICD-10-CM | POA: Diagnosis not present

## 2019-10-31 DIAGNOSIS — E119 Type 2 diabetes mellitus without complications: Secondary | ICD-10-CM | POA: Diagnosis not present

## 2019-10-31 DIAGNOSIS — Z125 Encounter for screening for malignant neoplasm of prostate: Secondary | ICD-10-CM | POA: Diagnosis not present

## 2019-10-31 DIAGNOSIS — E7849 Other hyperlipidemia: Secondary | ICD-10-CM | POA: Diagnosis not present

## 2019-11-01 DIAGNOSIS — R82998 Other abnormal findings in urine: Secondary | ICD-10-CM | POA: Diagnosis not present

## 2020-03-14 DIAGNOSIS — L570 Actinic keratosis: Secondary | ICD-10-CM | POA: Diagnosis not present

## 2020-03-14 DIAGNOSIS — C44529 Squamous cell carcinoma of skin of other part of trunk: Secondary | ICD-10-CM | POA: Diagnosis not present

## 2020-03-14 DIAGNOSIS — C44729 Squamous cell carcinoma of skin of left lower limb, including hip: Secondary | ICD-10-CM | POA: Diagnosis not present

## 2020-07-04 DIAGNOSIS — M25561 Pain in right knee: Secondary | ICD-10-CM | POA: Diagnosis not present

## 2020-07-04 DIAGNOSIS — M25562 Pain in left knee: Secondary | ICD-10-CM | POA: Diagnosis not present

## 2020-08-05 DIAGNOSIS — Z23 Encounter for immunization: Secondary | ICD-10-CM | POA: Diagnosis not present

## 2020-09-24 DIAGNOSIS — C44729 Squamous cell carcinoma of skin of left lower limb, including hip: Secondary | ICD-10-CM | POA: Diagnosis not present

## 2020-09-24 DIAGNOSIS — L570 Actinic keratosis: Secondary | ICD-10-CM | POA: Diagnosis not present

## 2020-09-25 DIAGNOSIS — B351 Tinea unguium: Secondary | ICD-10-CM | POA: Diagnosis not present

## 2020-09-25 DIAGNOSIS — E119 Type 2 diabetes mellitus without complications: Secondary | ICD-10-CM | POA: Diagnosis not present

## 2020-10-29 DIAGNOSIS — H5213 Myopia, bilateral: Secondary | ICD-10-CM | POA: Diagnosis not present

## 2020-10-29 DIAGNOSIS — H40013 Open angle with borderline findings, low risk, bilateral: Secondary | ICD-10-CM | POA: Diagnosis not present

## 2020-10-29 DIAGNOSIS — H2513 Age-related nuclear cataract, bilateral: Secondary | ICD-10-CM | POA: Diagnosis not present

## 2020-10-31 DIAGNOSIS — M170 Bilateral primary osteoarthritis of knee: Secondary | ICD-10-CM | POA: Diagnosis not present

## 2020-11-05 DIAGNOSIS — E785 Hyperlipidemia, unspecified: Secondary | ICD-10-CM | POA: Diagnosis not present

## 2020-11-05 DIAGNOSIS — E119 Type 2 diabetes mellitus without complications: Secondary | ICD-10-CM | POA: Diagnosis not present

## 2020-11-05 DIAGNOSIS — Z125 Encounter for screening for malignant neoplasm of prostate: Secondary | ICD-10-CM | POA: Diagnosis not present

## 2020-11-08 DIAGNOSIS — M170 Bilateral primary osteoarthritis of knee: Secondary | ICD-10-CM | POA: Diagnosis not present

## 2020-11-09 DIAGNOSIS — E1169 Type 2 diabetes mellitus with other specified complication: Secondary | ICD-10-CM | POA: Diagnosis not present

## 2020-11-09 DIAGNOSIS — E669 Obesity, unspecified: Secondary | ICD-10-CM | POA: Diagnosis not present

## 2020-11-09 DIAGNOSIS — D692 Other nonthrombocytopenic purpura: Secondary | ICD-10-CM | POA: Diagnosis not present

## 2020-11-09 DIAGNOSIS — M25569 Pain in unspecified knee: Secondary | ICD-10-CM | POA: Diagnosis not present

## 2020-11-09 DIAGNOSIS — R059 Cough, unspecified: Secondary | ICD-10-CM | POA: Diagnosis not present

## 2020-11-09 DIAGNOSIS — I440 Atrioventricular block, first degree: Secondary | ICD-10-CM | POA: Diagnosis not present

## 2020-11-09 DIAGNOSIS — R82998 Other abnormal findings in urine: Secondary | ICD-10-CM | POA: Diagnosis not present

## 2020-11-09 DIAGNOSIS — E785 Hyperlipidemia, unspecified: Secondary | ICD-10-CM | POA: Diagnosis not present

## 2020-11-09 DIAGNOSIS — I10 Essential (primary) hypertension: Secondary | ICD-10-CM | POA: Diagnosis not present

## 2020-11-09 DIAGNOSIS — J302 Other seasonal allergic rhinitis: Secondary | ICD-10-CM | POA: Diagnosis not present

## 2020-11-09 DIAGNOSIS — Z1331 Encounter for screening for depression: Secondary | ICD-10-CM | POA: Diagnosis not present

## 2020-11-09 DIAGNOSIS — Z0000 Encounter for general adult medical examination without abnormal findings: Secondary | ICD-10-CM | POA: Diagnosis not present

## 2020-11-09 DIAGNOSIS — M1990 Unspecified osteoarthritis, unspecified site: Secondary | ICD-10-CM | POA: Diagnosis not present

## 2020-11-13 DIAGNOSIS — Z1212 Encounter for screening for malignant neoplasm of rectum: Secondary | ICD-10-CM | POA: Diagnosis not present

## 2020-11-15 DIAGNOSIS — M170 Bilateral primary osteoarthritis of knee: Secondary | ICD-10-CM | POA: Diagnosis not present

## 2020-11-21 DIAGNOSIS — M170 Bilateral primary osteoarthritis of knee: Secondary | ICD-10-CM | POA: Diagnosis not present

## 2020-11-26 DIAGNOSIS — L905 Scar conditions and fibrosis of skin: Secondary | ICD-10-CM | POA: Diagnosis not present

## 2020-11-26 DIAGNOSIS — Z85828 Personal history of other malignant neoplasm of skin: Secondary | ICD-10-CM | POA: Diagnosis not present

## 2020-11-26 DIAGNOSIS — D225 Melanocytic nevi of trunk: Secondary | ICD-10-CM | POA: Diagnosis not present

## 2020-11-26 DIAGNOSIS — L570 Actinic keratosis: Secondary | ICD-10-CM | POA: Diagnosis not present

## 2020-11-26 DIAGNOSIS — L814 Other melanin hyperpigmentation: Secondary | ICD-10-CM | POA: Diagnosis not present

## 2020-11-26 DIAGNOSIS — L821 Other seborrheic keratosis: Secondary | ICD-10-CM | POA: Diagnosis not present

## 2021-02-12 DIAGNOSIS — M79672 Pain in left foot: Secondary | ICD-10-CM | POA: Diagnosis not present

## 2021-02-12 DIAGNOSIS — M79671 Pain in right foot: Secondary | ICD-10-CM | POA: Diagnosis not present

## 2021-02-12 DIAGNOSIS — L89892 Pressure ulcer of other site, stage 2: Secondary | ICD-10-CM | POA: Diagnosis not present

## 2021-02-12 DIAGNOSIS — E119 Type 2 diabetes mellitus without complications: Secondary | ICD-10-CM | POA: Diagnosis not present

## 2021-02-12 DIAGNOSIS — E11621 Type 2 diabetes mellitus with foot ulcer: Secondary | ICD-10-CM | POA: Diagnosis not present

## 2021-02-12 DIAGNOSIS — B351 Tinea unguium: Secondary | ICD-10-CM | POA: Diagnosis not present

## 2021-05-09 DIAGNOSIS — D692 Other nonthrombocytopenic purpura: Secondary | ICD-10-CM | POA: Diagnosis not present

## 2021-05-09 DIAGNOSIS — E785 Hyperlipidemia, unspecified: Secondary | ICD-10-CM | POA: Diagnosis not present

## 2021-05-09 DIAGNOSIS — I10 Essential (primary) hypertension: Secondary | ICD-10-CM | POA: Diagnosis not present

## 2021-05-09 DIAGNOSIS — R9389 Abnormal findings on diagnostic imaging of other specified body structures: Secondary | ICD-10-CM | POA: Diagnosis not present

## 2021-05-09 DIAGNOSIS — J302 Other seasonal allergic rhinitis: Secondary | ICD-10-CM | POA: Diagnosis not present

## 2021-05-09 DIAGNOSIS — E1169 Type 2 diabetes mellitus with other specified complication: Secondary | ICD-10-CM | POA: Diagnosis not present

## 2021-05-09 DIAGNOSIS — I440 Atrioventricular block, first degree: Secondary | ICD-10-CM | POA: Diagnosis not present

## 2021-05-09 DIAGNOSIS — N401 Enlarged prostate with lower urinary tract symptoms: Secondary | ICD-10-CM | POA: Diagnosis not present

## 2021-05-09 DIAGNOSIS — M1990 Unspecified osteoarthritis, unspecified site: Secondary | ICD-10-CM | POA: Diagnosis not present

## 2021-06-03 DIAGNOSIS — L814 Other melanin hyperpigmentation: Secondary | ICD-10-CM | POA: Diagnosis not present

## 2021-06-03 DIAGNOSIS — D485 Neoplasm of uncertain behavior of skin: Secondary | ICD-10-CM | POA: Diagnosis not present

## 2021-06-03 DIAGNOSIS — D225 Melanocytic nevi of trunk: Secondary | ICD-10-CM | POA: Diagnosis not present

## 2021-06-03 DIAGNOSIS — L821 Other seborrheic keratosis: Secondary | ICD-10-CM | POA: Diagnosis not present

## 2021-06-03 DIAGNOSIS — L570 Actinic keratosis: Secondary | ICD-10-CM | POA: Diagnosis not present

## 2021-06-03 DIAGNOSIS — C44619 Basal cell carcinoma of skin of left upper limb, including shoulder: Secondary | ICD-10-CM | POA: Diagnosis not present

## 2021-06-11 DIAGNOSIS — C44619 Basal cell carcinoma of skin of left upper limb, including shoulder: Secondary | ICD-10-CM | POA: Diagnosis not present

## 2021-06-12 ENCOUNTER — Other Ambulatory Visit (HOSPITAL_BASED_OUTPATIENT_CLINIC_OR_DEPARTMENT_OTHER): Payer: Self-pay

## 2021-06-12 ENCOUNTER — Other Ambulatory Visit: Payer: Self-pay

## 2021-06-12 ENCOUNTER — Ambulatory Visit: Payer: Medicare Other | Attending: Internal Medicine

## 2021-06-12 DIAGNOSIS — Z23 Encounter for immunization: Secondary | ICD-10-CM

## 2021-06-12 MED ORDER — COVID-19 MRNA VACC (MODERNA) 100 MCG/0.5ML IM SUSP
INTRAMUSCULAR | 0 refills | Status: DC
  Filled 2021-06-12: qty 0.25, 1d supply, fill #0

## 2021-06-12 NOTE — Progress Notes (Signed)
   Covid-19 Vaccination Clinic  Name:  SAVOY SOMERVILLE    MRN: [id number] DOB: 07/12/1934  06/12/2021  Mr. Mcpeters was observed post Covid-19 immunization for 15 minutes without incident. He was provided with Vaccine Information Sheet and instruction to access the V-Safe system.   Mr. Magnan was instructed to call 911 with any severe reactions post vaccine: Difficulty breathing  Swelling of face and throat  A fast heartbeat  A bad rash all over body  Dizziness and weakness   Immunizations Administered     Name Date Dose VIS Date Route   Moderna Covid-19 Booster Vaccine 06/12/2021  1:04 PM 0.25 mL 09/12/2020 Intramuscular   Manufacturer: Moderna   Lot: [id number]   [place]: [id number]

## 2021-07-02 DIAGNOSIS — M79672 Pain in left foot: Secondary | ICD-10-CM | POA: Diagnosis not present

## 2021-07-02 DIAGNOSIS — L89892 Pressure ulcer of other site, stage 2: Secondary | ICD-10-CM | POA: Diagnosis not present

## 2021-07-02 DIAGNOSIS — E11621 Type 2 diabetes mellitus with foot ulcer: Secondary | ICD-10-CM | POA: Diagnosis not present

## 2021-07-02 DIAGNOSIS — B351 Tinea unguium: Secondary | ICD-10-CM | POA: Diagnosis not present

## 2021-07-02 DIAGNOSIS — M79671 Pain in right foot: Secondary | ICD-10-CM | POA: Diagnosis not present

## 2021-09-04 DIAGNOSIS — R410 Disorientation, unspecified: Secondary | ICD-10-CM | POA: Diagnosis not present

## 2021-09-04 DIAGNOSIS — G3184 Mild cognitive impairment, so stated: Secondary | ICD-10-CM | POA: Diagnosis not present

## 2021-09-04 DIAGNOSIS — I10 Essential (primary) hypertension: Secondary | ICD-10-CM | POA: Diagnosis not present

## 2021-09-04 DIAGNOSIS — F32A Depression, unspecified: Secondary | ICD-10-CM | POA: Diagnosis not present

## 2021-09-05 ENCOUNTER — Other Ambulatory Visit: Payer: Self-pay | Admitting: Registered Nurse

## 2021-09-05 DIAGNOSIS — G3184 Mild cognitive impairment, so stated: Secondary | ICD-10-CM

## 2021-09-12 DIAGNOSIS — M170 Bilateral primary osteoarthritis of knee: Secondary | ICD-10-CM | POA: Diagnosis not present

## 2021-09-13 ENCOUNTER — Other Ambulatory Visit: Payer: Self-pay

## 2021-09-13 ENCOUNTER — Ambulatory Visit
Admission: RE | Admit: 2021-09-13 | Discharge: 2021-09-13 | Disposition: A | Discharge status: Home / Self Care | Payer: Medicare Other | Source: Ambulatory Visit | Attending: Registered Nurse | Admitting: Registered Nurse

## 2021-09-13 DIAGNOSIS — I6782 Cerebral ischemia: Secondary | ICD-10-CM | POA: Diagnosis not present

## 2021-09-13 DIAGNOSIS — G3184 Mild cognitive impairment, so stated: Secondary | ICD-10-CM

## 2021-09-13 DIAGNOSIS — G9389 Other specified disorders of brain: Secondary | ICD-10-CM | POA: Diagnosis not present

## 2021-09-20 ENCOUNTER — Other Ambulatory Visit: Payer: No Typology Code available for payment source

## 2021-10-22 DIAGNOSIS — R2689 Other abnormalities of gait and mobility: Secondary | ICD-10-CM | POA: Diagnosis not present

## 2021-10-22 DIAGNOSIS — M5459 Other low back pain: Secondary | ICD-10-CM | POA: Diagnosis not present

## 2021-10-22 DIAGNOSIS — R2681 Unsteadiness on feet: Secondary | ICD-10-CM | POA: Diagnosis not present

## 2021-10-22 DIAGNOSIS — M1389 Other specified arthritis, multiple sites: Secondary | ICD-10-CM | POA: Diagnosis not present

## 2021-10-22 DIAGNOSIS — R54 Age-related physical debility: Secondary | ICD-10-CM | POA: Diagnosis not present

## 2021-10-28 DIAGNOSIS — M5459 Other low back pain: Secondary | ICD-10-CM | POA: Diagnosis not present

## 2021-10-28 DIAGNOSIS — M1389 Other specified arthritis, multiple sites: Secondary | ICD-10-CM | POA: Diagnosis not present

## 2021-10-28 DIAGNOSIS — R2689 Other abnormalities of gait and mobility: Secondary | ICD-10-CM | POA: Diagnosis not present

## 2021-10-28 DIAGNOSIS — R2681 Unsteadiness on feet: Secondary | ICD-10-CM | POA: Diagnosis not present

## 2021-10-28 DIAGNOSIS — R54 Age-related physical debility: Secondary | ICD-10-CM | POA: Diagnosis not present

## 2021-10-30 DIAGNOSIS — M170 Bilateral primary osteoarthritis of knee: Secondary | ICD-10-CM | POA: Diagnosis not present

## 2021-11-04 DIAGNOSIS — R54 Age-related physical debility: Secondary | ICD-10-CM | POA: Diagnosis not present

## 2021-11-04 DIAGNOSIS — R2681 Unsteadiness on feet: Secondary | ICD-10-CM | POA: Diagnosis not present

## 2021-11-04 DIAGNOSIS — M5459 Other low back pain: Secondary | ICD-10-CM | POA: Diagnosis not present

## 2021-11-04 DIAGNOSIS — M1389 Other specified arthritis, multiple sites: Secondary | ICD-10-CM | POA: Diagnosis not present

## 2021-11-04 DIAGNOSIS — R2689 Other abnormalities of gait and mobility: Secondary | ICD-10-CM | POA: Diagnosis not present

## 2021-11-06 DIAGNOSIS — M170 Bilateral primary osteoarthritis of knee: Secondary | ICD-10-CM | POA: Diagnosis not present

## 2021-11-11 DIAGNOSIS — M5459 Other low back pain: Secondary | ICD-10-CM | POA: Diagnosis not present

## 2021-11-11 DIAGNOSIS — M1389 Other specified arthritis, multiple sites: Secondary | ICD-10-CM | POA: Diagnosis not present

## 2021-11-11 DIAGNOSIS — R54 Age-related physical debility: Secondary | ICD-10-CM | POA: Diagnosis not present

## 2021-11-11 DIAGNOSIS — R2689 Other abnormalities of gait and mobility: Secondary | ICD-10-CM | POA: Diagnosis not present

## 2021-11-11 DIAGNOSIS — R2681 Unsteadiness on feet: Secondary | ICD-10-CM | POA: Diagnosis not present

## 2021-11-13 DIAGNOSIS — M170 Bilateral primary osteoarthritis of knee: Secondary | ICD-10-CM | POA: Diagnosis not present

## 2021-11-21 DIAGNOSIS — Z20822 Contact with and (suspected) exposure to covid-19: Secondary | ICD-10-CM | POA: Diagnosis not present

## 2021-11-25 DIAGNOSIS — H5213 Myopia, bilateral: Secondary | ICD-10-CM | POA: Diagnosis not present

## 2021-11-25 DIAGNOSIS — H40013 Open angle with borderline findings, low risk, bilateral: Secondary | ICD-10-CM | POA: Diagnosis not present

## 2021-12-03 DIAGNOSIS — Z08 Encounter for follow-up examination after completed treatment for malignant neoplasm: Secondary | ICD-10-CM | POA: Diagnosis not present

## 2021-12-03 DIAGNOSIS — L814 Other melanin hyperpigmentation: Secondary | ICD-10-CM | POA: Diagnosis not present

## 2021-12-03 DIAGNOSIS — D485 Neoplasm of uncertain behavior of skin: Secondary | ICD-10-CM | POA: Diagnosis not present

## 2021-12-03 DIAGNOSIS — Z85828 Personal history of other malignant neoplasm of skin: Secondary | ICD-10-CM | POA: Diagnosis not present

## 2021-12-03 DIAGNOSIS — L570 Actinic keratosis: Secondary | ICD-10-CM | POA: Diagnosis not present

## 2021-12-03 DIAGNOSIS — L821 Other seborrheic keratosis: Secondary | ICD-10-CM | POA: Diagnosis not present

## 2021-12-03 DIAGNOSIS — C44529 Squamous cell carcinoma of skin of other part of trunk: Secondary | ICD-10-CM | POA: Diagnosis not present

## 2021-12-03 DIAGNOSIS — D225 Melanocytic nevi of trunk: Secondary | ICD-10-CM | POA: Diagnosis not present

## 2021-12-08 DIAGNOSIS — Z20828 Contact with and (suspected) exposure to other viral communicable diseases: Secondary | ICD-10-CM | POA: Diagnosis not present

## 2021-12-10 DIAGNOSIS — Z125 Encounter for screening for malignant neoplasm of prostate: Secondary | ICD-10-CM | POA: Diagnosis not present

## 2021-12-10 DIAGNOSIS — M1990 Unspecified osteoarthritis, unspecified site: Secondary | ICD-10-CM | POA: Diagnosis not present

## 2021-12-10 DIAGNOSIS — I10 Essential (primary) hypertension: Secondary | ICD-10-CM | POA: Diagnosis not present

## 2021-12-10 DIAGNOSIS — E785 Hyperlipidemia, unspecified: Secondary | ICD-10-CM | POA: Diagnosis not present

## 2021-12-10 DIAGNOSIS — E1169 Type 2 diabetes mellitus with other specified complication: Secondary | ICD-10-CM | POA: Diagnosis not present

## 2021-12-20 DIAGNOSIS — I440 Atrioventricular block, first degree: Secondary | ICD-10-CM | POA: Diagnosis not present

## 2021-12-20 DIAGNOSIS — E1169 Type 2 diabetes mellitus with other specified complication: Secondary | ICD-10-CM | POA: Diagnosis not present

## 2021-12-20 DIAGNOSIS — E119 Type 2 diabetes mellitus without complications: Secondary | ICD-10-CM | POA: Diagnosis not present

## 2021-12-20 DIAGNOSIS — M48061 Spinal stenosis, lumbar region without neurogenic claudication: Secondary | ICD-10-CM | POA: Diagnosis not present

## 2021-12-20 DIAGNOSIS — Z23 Encounter for immunization: Secondary | ICD-10-CM | POA: Diagnosis not present

## 2021-12-20 DIAGNOSIS — I10 Essential (primary) hypertension: Secondary | ICD-10-CM | POA: Diagnosis not present

## 2021-12-20 DIAGNOSIS — M1990 Unspecified osteoarthritis, unspecified site: Secondary | ICD-10-CM | POA: Diagnosis not present

## 2021-12-20 DIAGNOSIS — E669 Obesity, unspecified: Secondary | ICD-10-CM | POA: Diagnosis not present

## 2021-12-20 DIAGNOSIS — Z1331 Encounter for screening for depression: Secondary | ICD-10-CM | POA: Diagnosis not present

## 2021-12-20 DIAGNOSIS — Z1339 Encounter for screening examination for other mental health and behavioral disorders: Secondary | ICD-10-CM | POA: Diagnosis not present

## 2021-12-20 DIAGNOSIS — R269 Unspecified abnormalities of gait and mobility: Secondary | ICD-10-CM | POA: Diagnosis not present

## 2021-12-20 DIAGNOSIS — R82998 Other abnormal findings in urine: Secondary | ICD-10-CM | POA: Diagnosis not present

## 2021-12-20 DIAGNOSIS — D692 Other nonthrombocytopenic purpura: Secondary | ICD-10-CM | POA: Diagnosis not present

## 2021-12-20 DIAGNOSIS — G3184 Mild cognitive impairment, so stated: Secondary | ICD-10-CM | POA: Diagnosis not present

## 2021-12-20 DIAGNOSIS — Z0000 Encounter for general adult medical examination without abnormal findings: Secondary | ICD-10-CM | POA: Diagnosis not present

## 2021-12-24 DIAGNOSIS — E119 Type 2 diabetes mellitus without complications: Secondary | ICD-10-CM | POA: Diagnosis not present

## 2021-12-24 DIAGNOSIS — M79672 Pain in left foot: Secondary | ICD-10-CM | POA: Diagnosis not present

## 2021-12-24 DIAGNOSIS — B351 Tinea unguium: Secondary | ICD-10-CM | POA: Diagnosis not present

## 2021-12-24 DIAGNOSIS — M79671 Pain in right foot: Secondary | ICD-10-CM | POA: Diagnosis not present

## 2022-01-03 DIAGNOSIS — Z20822 Contact with and (suspected) exposure to covid-19: Secondary | ICD-10-CM | POA: Diagnosis not present

## 2022-01-22 DIAGNOSIS — Z20822 Contact with and (suspected) exposure to covid-19: Secondary | ICD-10-CM | POA: Diagnosis not present

## 2022-02-04 DIAGNOSIS — Z08 Encounter for follow-up examination after completed treatment for malignant neoplasm: Secondary | ICD-10-CM | POA: Diagnosis not present

## 2022-02-04 DIAGNOSIS — Z09 Encounter for follow-up examination after completed treatment for conditions other than malignant neoplasm: Secondary | ICD-10-CM | POA: Diagnosis not present

## 2022-02-04 DIAGNOSIS — L570 Actinic keratosis: Secondary | ICD-10-CM | POA: Diagnosis not present

## 2022-02-04 DIAGNOSIS — Z85828 Personal history of other malignant neoplasm of skin: Secondary | ICD-10-CM | POA: Diagnosis not present

## 2022-02-04 DIAGNOSIS — D485 Neoplasm of uncertain behavior of skin: Secondary | ICD-10-CM | POA: Diagnosis not present

## 2022-02-04 DIAGNOSIS — L821 Other seborrheic keratosis: Secondary | ICD-10-CM | POA: Diagnosis not present

## 2022-02-04 DIAGNOSIS — C44529 Squamous cell carcinoma of skin of other part of trunk: Secondary | ICD-10-CM | POA: Diagnosis not present

## 2022-02-22 DIAGNOSIS — Z20822 Contact with and (suspected) exposure to covid-19: Secondary | ICD-10-CM | POA: Diagnosis not present

## 2022-03-12 DIAGNOSIS — C44529 Squamous cell carcinoma of skin of other part of trunk: Secondary | ICD-10-CM | POA: Diagnosis not present

## 2022-03-12 DIAGNOSIS — L989 Disorder of the skin and subcutaneous tissue, unspecified: Secondary | ICD-10-CM | POA: Diagnosis not present

## 2022-03-24 DIAGNOSIS — Z20822 Contact with and (suspected) exposure to covid-19: Secondary | ICD-10-CM | POA: Diagnosis not present

## 2022-04-08 DIAGNOSIS — M79671 Pain in right foot: Secondary | ICD-10-CM | POA: Diagnosis not present

## 2022-04-08 DIAGNOSIS — B351 Tinea unguium: Secondary | ICD-10-CM | POA: Diagnosis not present

## 2022-04-08 DIAGNOSIS — M79672 Pain in left foot: Secondary | ICD-10-CM | POA: Diagnosis not present

## 2022-04-08 DIAGNOSIS — E119 Type 2 diabetes mellitus without complications: Secondary | ICD-10-CM | POA: Diagnosis not present

## 2022-04-26 IMAGING — MR MR HEAD W/O CM
10 series · 48 of 48 positions shown · non-contrast
Comparison: None.

CLINICAL DATA: Cognitive impairment

EXAM:
MRI HEAD WITHOUT CONTRAST
TECHNIQUE: Multiplanar, multiecho pulse sequences of the brain and surrounding
structures were obtained without intravenous contrast.

[Series 2: T1 · sagittal · 5.0mm · 0.45mm/px · 2 of 21 slices shown]
[im 1/21]
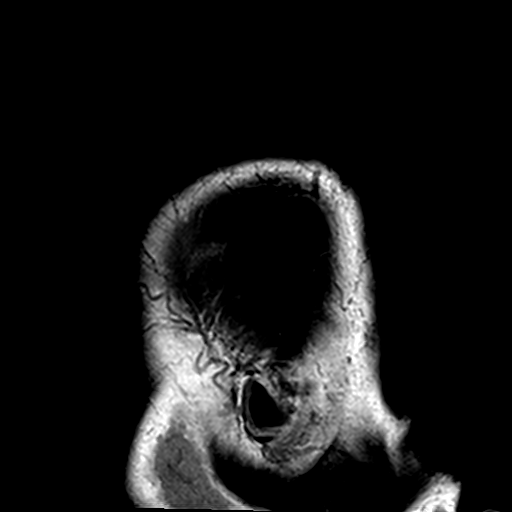
[im 21/21]
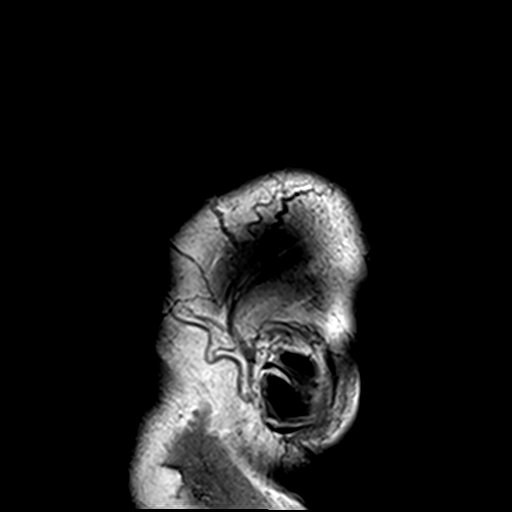

[Series 3: DWI · axial · 3.0mm · 1.80mm/px · z∈[-112,+43]mm · 9 of 105 slices shown (1 of 4)]
[im 1/105]
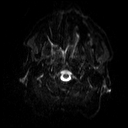
[im 14/105]
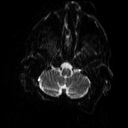
[im 27/105]
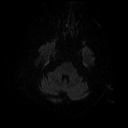
[im 40/105]
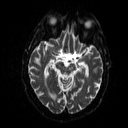
[im 53/105]
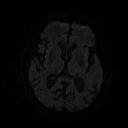
[im 66/105]
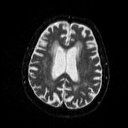
[im 79/105]
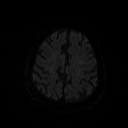
[im 92/105]
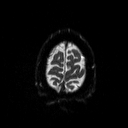
[im 105/105]
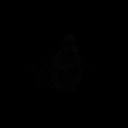

[Series 4: DWI · axial · 3.0mm · 1.80mm/px · z∈[-112,+43]mm · 5 of 52 slices shown (2 of 4)]
[im 1/52]
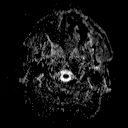
[im 13/52]
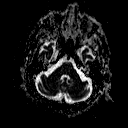
[im 26/52]
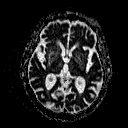
[im 39/52]
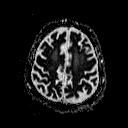
[im 52/52]
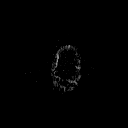

[Series 5: DWI · coronal · 5.0mm · 1.80mm/px · 6 of 72 slices shown (3 of 4)]
[im 1/72]
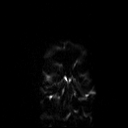
[im 15/72]
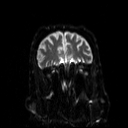
[im 29/72]
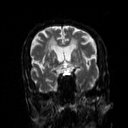
[im 43/72]
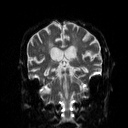
[im 57/72]
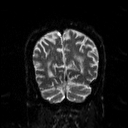
[im 72/72]
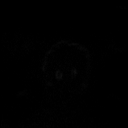

[Series 6: DWI · coronal · 5.0mm · 1.80mm/px · 3 of 34 slices shown (4 of 4)]
[im 1/34]
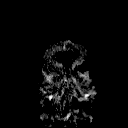
[im 17/34]
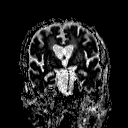
[im 34/34]
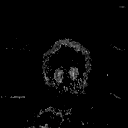

[Series 7: T2 · axial · 5.0mm · 0.51mm/px · z∈[-124,+35]mm · 2 of 24 slices shown (1 of 2)]
[im 1/24]
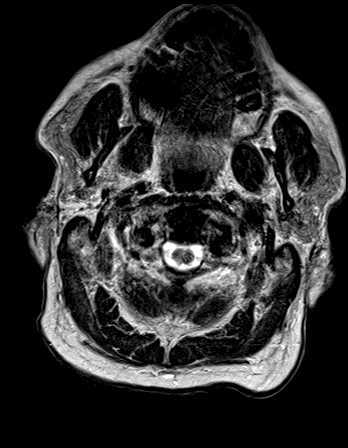
[im 24/24]
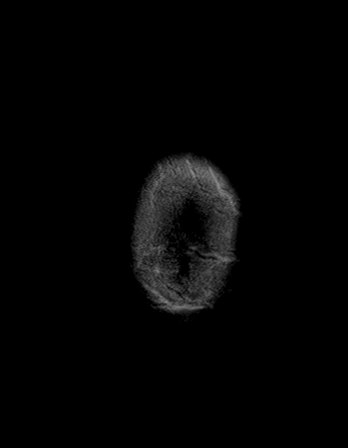

[Series 8: FLAIR · axial · 3.0mm · 0.45mm/px · z∈[-112,+21]mm · 3 of 30 slices shown]
[im 1/30]
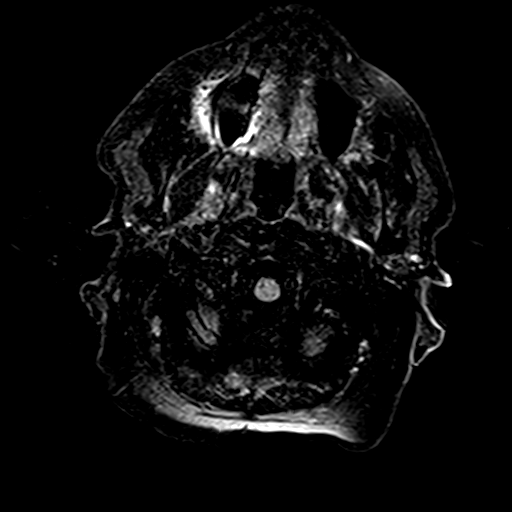
[im 15/30]
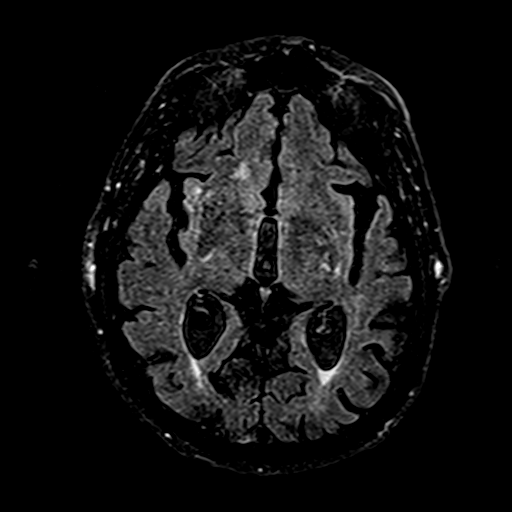
[im 30/30]
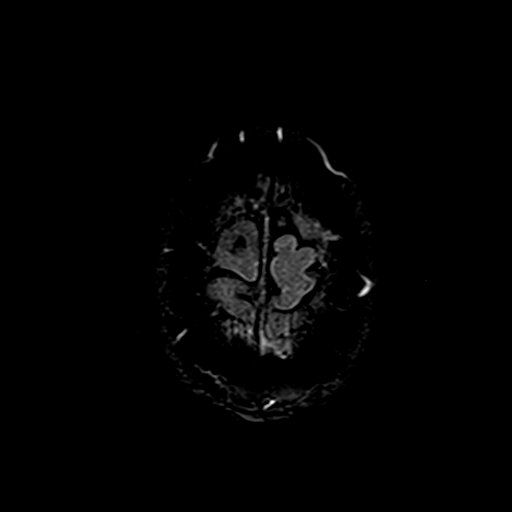

[Series 10: swi_images · axial · 4.0mm · 0.90mm/px · z∈[-115,+24]mm · 3 of 36 slices shown]
[im 1/36]
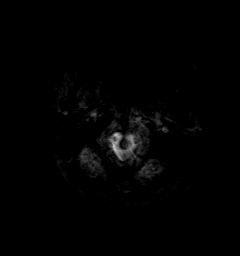
[im 18/36]
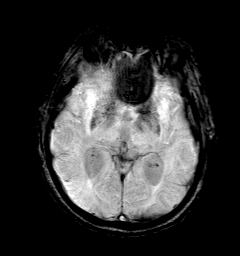
[im 36/36]
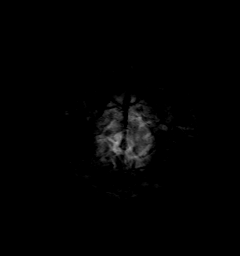

[Series 11: t1_mpr_tra · axial · 1.0mm · 0.71mm/px · z∈[-115,+27]mm · 13 of 144 slices shown]
[im 1/144]
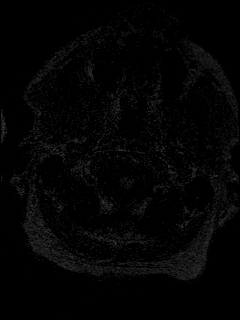
[im 12/144]
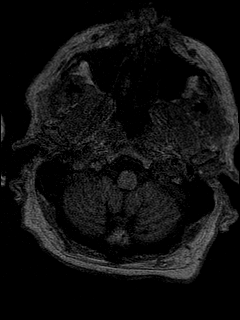
[im 24/144]
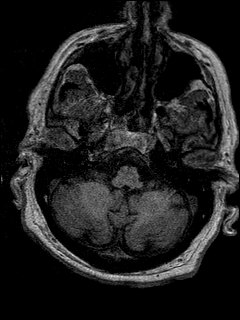
[im 36/144]
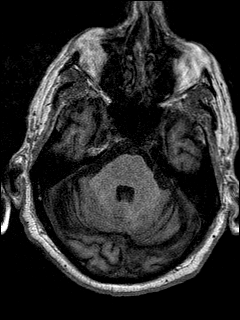
[im 48/144]
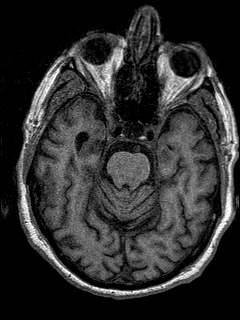
[im 60/144]
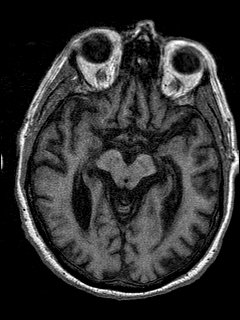
[im 72/144]
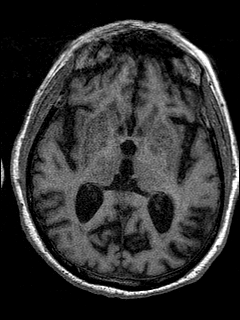
[im 84/144]
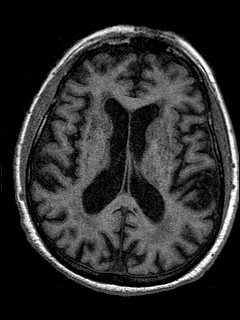
[im 96/144]
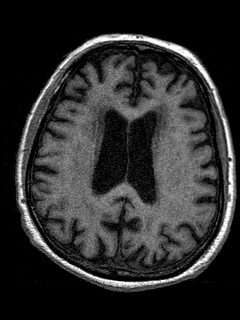
[im 108/144]
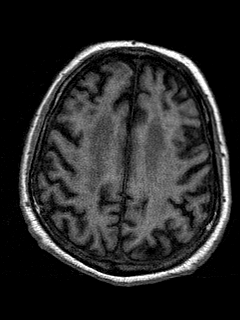
[im 120/144]
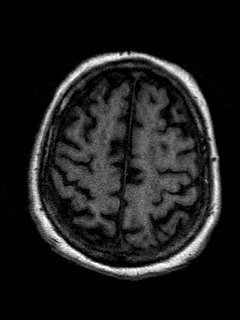
[im 132/144]
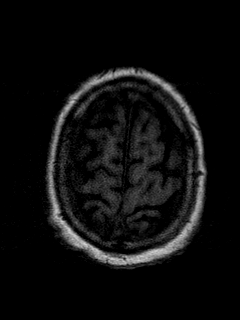
[im 144/144]
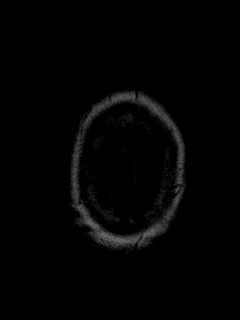

[Series 12: T2 · coronal · 5.0mm · 0.45mm/px · 2 of 27 slices shown (2 of 2)]
[im 1/27]
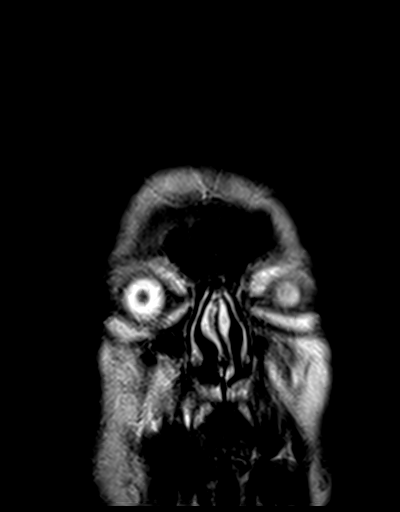
[im 27/27]
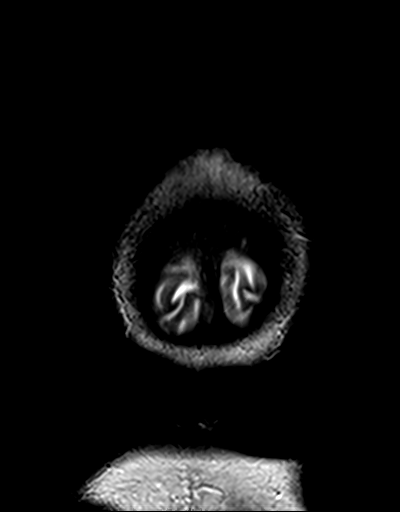

[48 of 48 positions shown; findings below may reference images not displayed]

FINDINGS: Brain: Mild to moderate generalized atrophy. Mild ventricular
enlargement consistent with the degree of atrophy. Negative for
acute infarct.

Moderate white matter changes with periventricular deep white matter
hyperintensities in both cerebral hemispheres. Mild hyperintensity
in the pons. Negative for hemorrhage or mass.

Motion degraded study.

Vascular: Normal arterial flow voids.

Skull and upper cervical spine: Negative

Sinuses/Orbits: Mild mucosal edema paranasal sinuses. Negative orbit

Other: None
IMPRESSION: Generalized atrophy with moderate chronic microvascular ischemic
changes in the white matter and pons. No acute abnormality.

## 2022-04-30 DIAGNOSIS — S41111A Laceration without foreign body of right upper arm, initial encounter: Secondary | ICD-10-CM | POA: Diagnosis not present

## 2022-05-12 DIAGNOSIS — Z872 Personal history of diseases of the skin and subcutaneous tissue: Secondary | ICD-10-CM | POA: Diagnosis not present

## 2022-05-12 DIAGNOSIS — L821 Other seborrheic keratosis: Secondary | ICD-10-CM | POA: Diagnosis not present

## 2022-05-12 DIAGNOSIS — L570 Actinic keratosis: Secondary | ICD-10-CM | POA: Diagnosis not present

## 2022-05-12 DIAGNOSIS — Z85828 Personal history of other malignant neoplasm of skin: Secondary | ICD-10-CM | POA: Diagnosis not present

## 2022-05-12 DIAGNOSIS — L814 Other melanin hyperpigmentation: Secondary | ICD-10-CM | POA: Diagnosis not present

## 2022-05-12 DIAGNOSIS — Z09 Encounter for follow-up examination after completed treatment for conditions other than malignant neoplasm: Secondary | ICD-10-CM | POA: Diagnosis not present

## 2022-05-12 DIAGNOSIS — Z08 Encounter for follow-up examination after completed treatment for malignant neoplasm: Secondary | ICD-10-CM | POA: Diagnosis not present

## 2022-05-12 DIAGNOSIS — L218 Other seborrheic dermatitis: Secondary | ICD-10-CM | POA: Diagnosis not present

## 2022-07-23 DIAGNOSIS — N401 Enlarged prostate with lower urinary tract symptoms: Secondary | ICD-10-CM | POA: Diagnosis not present

## 2022-07-23 DIAGNOSIS — M1990 Unspecified osteoarthritis, unspecified site: Secondary | ICD-10-CM | POA: Diagnosis not present

## 2022-07-23 DIAGNOSIS — J302 Other seasonal allergic rhinitis: Secondary | ICD-10-CM | POA: Diagnosis not present

## 2022-07-23 DIAGNOSIS — G3184 Mild cognitive impairment, so stated: Secondary | ICD-10-CM | POA: Diagnosis not present

## 2022-07-23 DIAGNOSIS — R269 Unspecified abnormalities of gait and mobility: Secondary | ICD-10-CM | POA: Diagnosis not present

## 2022-07-23 DIAGNOSIS — E785 Hyperlipidemia, unspecified: Secondary | ICD-10-CM | POA: Diagnosis not present

## 2022-07-23 DIAGNOSIS — I10 Essential (primary) hypertension: Secondary | ICD-10-CM | POA: Diagnosis not present

## 2022-07-23 DIAGNOSIS — D692 Other nonthrombocytopenic purpura: Secondary | ICD-10-CM | POA: Diagnosis not present

## 2022-07-23 DIAGNOSIS — I440 Atrioventricular block, first degree: Secondary | ICD-10-CM | POA: Diagnosis not present

## 2022-07-23 DIAGNOSIS — E1169 Type 2 diabetes mellitus with other specified complication: Secondary | ICD-10-CM | POA: Diagnosis not present

## 2022-07-23 DIAGNOSIS — M48061 Spinal stenosis, lumbar region without neurogenic claudication: Secondary | ICD-10-CM | POA: Diagnosis not present

## 2022-07-23 DIAGNOSIS — S41111A Laceration without foreign body of right upper arm, initial encounter: Secondary | ICD-10-CM | POA: Diagnosis not present

## 2022-08-26 DIAGNOSIS — L84 Corns and callosities: Secondary | ICD-10-CM | POA: Diagnosis not present

## 2022-08-26 DIAGNOSIS — B351 Tinea unguium: Secondary | ICD-10-CM | POA: Diagnosis not present

## 2022-08-26 DIAGNOSIS — E1159 Type 2 diabetes mellitus with other circulatory complications: Secondary | ICD-10-CM | POA: Diagnosis not present

## 2022-08-29 DIAGNOSIS — Z23 Encounter for immunization: Secondary | ICD-10-CM | POA: Diagnosis not present

## 2022-09-29 DIAGNOSIS — Z23 Encounter for immunization: Secondary | ICD-10-CM | POA: Diagnosis not present

## 2022-11-11 DIAGNOSIS — L578 Other skin changes due to chronic exposure to nonionizing radiation: Secondary | ICD-10-CM | POA: Diagnosis not present

## 2022-11-11 DIAGNOSIS — L570 Actinic keratosis: Secondary | ICD-10-CM | POA: Diagnosis not present

## 2022-11-11 DIAGNOSIS — Z85828 Personal history of other malignant neoplasm of skin: Secondary | ICD-10-CM | POA: Diagnosis not present

## 2022-11-11 DIAGNOSIS — L821 Other seborrheic keratosis: Secondary | ICD-10-CM | POA: Diagnosis not present

## 2022-11-11 DIAGNOSIS — L814 Other melanin hyperpigmentation: Secondary | ICD-10-CM | POA: Diagnosis not present

## 2022-11-11 DIAGNOSIS — L538 Other specified erythematous conditions: Secondary | ICD-10-CM | POA: Diagnosis not present

## 2022-11-11 DIAGNOSIS — Z09 Encounter for follow-up examination after completed treatment for conditions other than malignant neoplasm: Secondary | ICD-10-CM | POA: Diagnosis not present

## 2022-11-11 DIAGNOSIS — Z08 Encounter for follow-up examination after completed treatment for malignant neoplasm: Secondary | ICD-10-CM | POA: Diagnosis not present

## 2022-11-11 DIAGNOSIS — D485 Neoplasm of uncertain behavior of skin: Secondary | ICD-10-CM | POA: Diagnosis not present

## 2022-11-11 DIAGNOSIS — D225 Melanocytic nevi of trunk: Secondary | ICD-10-CM | POA: Diagnosis not present

## 2022-12-09 DIAGNOSIS — E1159 Type 2 diabetes mellitus with other circulatory complications: Secondary | ICD-10-CM | POA: Diagnosis not present

## 2022-12-09 DIAGNOSIS — B351 Tinea unguium: Secondary | ICD-10-CM | POA: Diagnosis not present

## 2022-12-09 DIAGNOSIS — L84 Corns and callosities: Secondary | ICD-10-CM | POA: Diagnosis not present

## 2022-12-19 DIAGNOSIS — H2513 Age-related nuclear cataract, bilateral: Secondary | ICD-10-CM | POA: Diagnosis not present

## 2022-12-19 DIAGNOSIS — H5213 Myopia, bilateral: Secondary | ICD-10-CM | POA: Diagnosis not present

## 2022-12-19 DIAGNOSIS — H40023 Open angle with borderline findings, high risk, bilateral: Secondary | ICD-10-CM | POA: Diagnosis not present

## 2022-12-24 DIAGNOSIS — I10 Essential (primary) hypertension: Secondary | ICD-10-CM | POA: Diagnosis not present

## 2022-12-24 DIAGNOSIS — M069 Rheumatoid arthritis, unspecified: Secondary | ICD-10-CM | POA: Diagnosis not present

## 2022-12-24 DIAGNOSIS — Z832 Family history of diseases of the blood and blood-forming organs and certain disorders involving the immune mechanism: Secondary | ICD-10-CM | POA: Diagnosis not present

## 2022-12-24 DIAGNOSIS — Z1379 Encounter for other screening for genetic and chromosomal anomalies: Secondary | ICD-10-CM | POA: Diagnosis not present

## 2022-12-29 DIAGNOSIS — E1169 Type 2 diabetes mellitus with other specified complication: Secondary | ICD-10-CM | POA: Diagnosis not present

## 2022-12-29 DIAGNOSIS — Z125 Encounter for screening for malignant neoplasm of prostate: Secondary | ICD-10-CM | POA: Diagnosis not present

## 2022-12-29 DIAGNOSIS — R7989 Other specified abnormal findings of blood chemistry: Secondary | ICD-10-CM | POA: Diagnosis not present

## 2022-12-29 DIAGNOSIS — I10 Essential (primary) hypertension: Secondary | ICD-10-CM | POA: Diagnosis not present

## 2022-12-29 DIAGNOSIS — E785 Hyperlipidemia, unspecified: Secondary | ICD-10-CM | POA: Diagnosis not present

## 2023-01-05 DIAGNOSIS — E785 Hyperlipidemia, unspecified: Secondary | ICD-10-CM | POA: Diagnosis not present

## 2023-01-05 DIAGNOSIS — Z1331 Encounter for screening for depression: Secondary | ICD-10-CM | POA: Diagnosis not present

## 2023-01-05 DIAGNOSIS — F03A0 Unspecified dementia, mild, without behavioral disturbance, psychotic disturbance, mood disturbance, and anxiety: Secondary | ICD-10-CM | POA: Diagnosis not present

## 2023-01-05 DIAGNOSIS — Z0000 Encounter for general adult medical examination without abnormal findings: Secondary | ICD-10-CM | POA: Diagnosis not present

## 2023-01-05 DIAGNOSIS — R269 Unspecified abnormalities of gait and mobility: Secondary | ICD-10-CM | POA: Diagnosis not present

## 2023-01-05 DIAGNOSIS — R82998 Other abnormal findings in urine: Secondary | ICD-10-CM | POA: Diagnosis not present

## 2023-01-05 DIAGNOSIS — D692 Other nonthrombocytopenic purpura: Secondary | ICD-10-CM | POA: Diagnosis not present

## 2023-01-05 DIAGNOSIS — H6123 Impacted cerumen, bilateral: Secondary | ICD-10-CM | POA: Diagnosis not present

## 2023-01-05 DIAGNOSIS — E669 Obesity, unspecified: Secondary | ICD-10-CM | POA: Diagnosis not present

## 2023-01-05 DIAGNOSIS — E1169 Type 2 diabetes mellitus with other specified complication: Secondary | ICD-10-CM | POA: Diagnosis not present

## 2023-01-05 DIAGNOSIS — D126 Benign neoplasm of colon, unspecified: Secondary | ICD-10-CM | POA: Diagnosis not present

## 2023-01-05 DIAGNOSIS — I440 Atrioventricular block, first degree: Secondary | ICD-10-CM | POA: Diagnosis not present

## 2023-01-05 DIAGNOSIS — I10 Essential (primary) hypertension: Secondary | ICD-10-CM | POA: Diagnosis not present

## 2023-01-29 DIAGNOSIS — H2512 Age-related nuclear cataract, left eye: Secondary | ICD-10-CM | POA: Diagnosis not present

## 2023-01-29 DIAGNOSIS — H269 Unspecified cataract: Secondary | ICD-10-CM | POA: Diagnosis not present

## 2023-02-12 DIAGNOSIS — H269 Unspecified cataract: Secondary | ICD-10-CM | POA: Diagnosis not present

## 2023-02-12 DIAGNOSIS — H21561 Pupillary abnormality, right eye: Secondary | ICD-10-CM | POA: Diagnosis not present

## 2023-02-12 DIAGNOSIS — H2511 Age-related nuclear cataract, right eye: Secondary | ICD-10-CM | POA: Diagnosis not present

## 2023-02-12 DIAGNOSIS — H2181 Floppy iris syndrome: Secondary | ICD-10-CM | POA: Diagnosis not present

## 2023-03-20 DIAGNOSIS — Z23 Encounter for immunization: Secondary | ICD-10-CM | POA: Diagnosis not present

## 2023-03-24 DIAGNOSIS — E1159 Type 2 diabetes mellitus with other circulatory complications: Secondary | ICD-10-CM | POA: Diagnosis not present

## 2023-03-24 DIAGNOSIS — B351 Tinea unguium: Secondary | ICD-10-CM | POA: Diagnosis not present

## 2023-03-24 DIAGNOSIS — L84 Corns and callosities: Secondary | ICD-10-CM | POA: Diagnosis not present

## 2023-04-07 DIAGNOSIS — D1801 Hemangioma of skin and subcutaneous tissue: Secondary | ICD-10-CM | POA: Diagnosis not present

## 2023-04-07 DIAGNOSIS — L570 Actinic keratosis: Secondary | ICD-10-CM | POA: Diagnosis not present

## 2023-04-07 DIAGNOSIS — L821 Other seborrheic keratosis: Secondary | ICD-10-CM | POA: Diagnosis not present

## 2023-04-07 DIAGNOSIS — L578 Other skin changes due to chronic exposure to nonionizing radiation: Secondary | ICD-10-CM | POA: Diagnosis not present

## 2023-04-07 DIAGNOSIS — L218 Other seborrheic dermatitis: Secondary | ICD-10-CM | POA: Diagnosis not present

## 2023-04-07 DIAGNOSIS — L538 Other specified erythematous conditions: Secondary | ICD-10-CM | POA: Diagnosis not present

## 2023-04-07 DIAGNOSIS — L814 Other melanin hyperpigmentation: Secondary | ICD-10-CM | POA: Diagnosis not present

## 2023-04-07 DIAGNOSIS — C44529 Squamous cell carcinoma of skin of other part of trunk: Secondary | ICD-10-CM | POA: Diagnosis not present

## 2023-04-07 DIAGNOSIS — D485 Neoplasm of uncertain behavior of skin: Secondary | ICD-10-CM | POA: Diagnosis not present

## 2023-04-07 DIAGNOSIS — L718 Other rosacea: Secondary | ICD-10-CM | POA: Diagnosis not present

## 2023-04-28 DIAGNOSIS — C44529 Squamous cell carcinoma of skin of other part of trunk: Secondary | ICD-10-CM | POA: Diagnosis not present

## 2023-05-18 DIAGNOSIS — R2689 Other abnormalities of gait and mobility: Secondary | ICD-10-CM | POA: Diagnosis not present

## 2023-05-18 DIAGNOSIS — Z9181 History of falling: Secondary | ICD-10-CM | POA: Diagnosis not present

## 2023-05-18 DIAGNOSIS — M25562 Pain in left knee: Secondary | ICD-10-CM | POA: Diagnosis not present

## 2023-05-18 DIAGNOSIS — M6259 Muscle wasting and atrophy, not elsewhere classified, multiple sites: Secondary | ICD-10-CM | POA: Diagnosis not present

## 2023-05-18 DIAGNOSIS — R2681 Unsteadiness on feet: Secondary | ICD-10-CM | POA: Diagnosis not present

## 2023-05-22 DIAGNOSIS — Z9181 History of falling: Secondary | ICD-10-CM | POA: Diagnosis not present

## 2023-05-22 DIAGNOSIS — M25562 Pain in left knee: Secondary | ICD-10-CM | POA: Diagnosis not present

## 2023-05-22 DIAGNOSIS — M6259 Muscle wasting and atrophy, not elsewhere classified, multiple sites: Secondary | ICD-10-CM | POA: Diagnosis not present

## 2023-05-22 DIAGNOSIS — R2689 Other abnormalities of gait and mobility: Secondary | ICD-10-CM | POA: Diagnosis not present

## 2023-05-22 DIAGNOSIS — R2681 Unsteadiness on feet: Secondary | ICD-10-CM | POA: Diagnosis not present

## 2023-05-25 DIAGNOSIS — Z9181 History of falling: Secondary | ICD-10-CM | POA: Diagnosis not present

## 2023-05-25 DIAGNOSIS — M25562 Pain in left knee: Secondary | ICD-10-CM | POA: Diagnosis not present

## 2023-05-25 DIAGNOSIS — R2681 Unsteadiness on feet: Secondary | ICD-10-CM | POA: Diagnosis not present

## 2023-05-25 DIAGNOSIS — M6259 Muscle wasting and atrophy, not elsewhere classified, multiple sites: Secondary | ICD-10-CM | POA: Diagnosis not present

## 2023-05-25 DIAGNOSIS — R2689 Other abnormalities of gait and mobility: Secondary | ICD-10-CM | POA: Diagnosis not present

## 2023-05-27 DIAGNOSIS — Z9181 History of falling: Secondary | ICD-10-CM | POA: Diagnosis not present

## 2023-05-27 DIAGNOSIS — R2689 Other abnormalities of gait and mobility: Secondary | ICD-10-CM | POA: Diagnosis not present

## 2023-05-27 DIAGNOSIS — M6259 Muscle wasting and atrophy, not elsewhere classified, multiple sites: Secondary | ICD-10-CM | POA: Diagnosis not present

## 2023-05-27 DIAGNOSIS — M25562 Pain in left knee: Secondary | ICD-10-CM | POA: Diagnosis not present

## 2023-05-27 DIAGNOSIS — R2681 Unsteadiness on feet: Secondary | ICD-10-CM | POA: Diagnosis not present

## 2023-05-29 DIAGNOSIS — M25562 Pain in left knee: Secondary | ICD-10-CM | POA: Diagnosis not present

## 2023-05-29 DIAGNOSIS — M6259 Muscle wasting and atrophy, not elsewhere classified, multiple sites: Secondary | ICD-10-CM | POA: Diagnosis not present

## 2023-05-29 DIAGNOSIS — R2681 Unsteadiness on feet: Secondary | ICD-10-CM | POA: Diagnosis not present

## 2023-05-29 DIAGNOSIS — Z9181 History of falling: Secondary | ICD-10-CM | POA: Diagnosis not present

## 2023-05-29 DIAGNOSIS — R2689 Other abnormalities of gait and mobility: Secondary | ICD-10-CM | POA: Diagnosis not present

## 2023-06-01 DIAGNOSIS — M6259 Muscle wasting and atrophy, not elsewhere classified, multiple sites: Secondary | ICD-10-CM | POA: Diagnosis not present

## 2023-06-01 DIAGNOSIS — R2689 Other abnormalities of gait and mobility: Secondary | ICD-10-CM | POA: Diagnosis not present

## 2023-06-01 DIAGNOSIS — Z9181 History of falling: Secondary | ICD-10-CM | POA: Diagnosis not present

## 2023-06-01 DIAGNOSIS — M25562 Pain in left knee: Secondary | ICD-10-CM | POA: Diagnosis not present

## 2023-06-01 DIAGNOSIS — R2681 Unsteadiness on feet: Secondary | ICD-10-CM | POA: Diagnosis not present

## 2023-06-03 DIAGNOSIS — Z9181 History of falling: Secondary | ICD-10-CM | POA: Diagnosis not present

## 2023-06-03 DIAGNOSIS — R2689 Other abnormalities of gait and mobility: Secondary | ICD-10-CM | POA: Diagnosis not present

## 2023-06-03 DIAGNOSIS — R2681 Unsteadiness on feet: Secondary | ICD-10-CM | POA: Diagnosis not present

## 2023-06-03 DIAGNOSIS — M6259 Muscle wasting and atrophy, not elsewhere classified, multiple sites: Secondary | ICD-10-CM | POA: Diagnosis not present

## 2023-06-03 DIAGNOSIS — M25562 Pain in left knee: Secondary | ICD-10-CM | POA: Diagnosis not present

## 2023-06-04 DIAGNOSIS — R2689 Other abnormalities of gait and mobility: Secondary | ICD-10-CM | POA: Diagnosis not present

## 2023-06-04 DIAGNOSIS — R2681 Unsteadiness on feet: Secondary | ICD-10-CM | POA: Diagnosis not present

## 2023-06-04 DIAGNOSIS — M25562 Pain in left knee: Secondary | ICD-10-CM | POA: Diagnosis not present

## 2023-06-04 DIAGNOSIS — Z9181 History of falling: Secondary | ICD-10-CM | POA: Diagnosis not present

## 2023-06-04 DIAGNOSIS — M6259 Muscle wasting and atrophy, not elsewhere classified, multiple sites: Secondary | ICD-10-CM | POA: Diagnosis not present

## 2023-06-08 DIAGNOSIS — Z9181 History of falling: Secondary | ICD-10-CM | POA: Diagnosis not present

## 2023-06-08 DIAGNOSIS — R2689 Other abnormalities of gait and mobility: Secondary | ICD-10-CM | POA: Diagnosis not present

## 2023-06-08 DIAGNOSIS — M6259 Muscle wasting and atrophy, not elsewhere classified, multiple sites: Secondary | ICD-10-CM | POA: Diagnosis not present

## 2023-06-08 DIAGNOSIS — R2681 Unsteadiness on feet: Secondary | ICD-10-CM | POA: Diagnosis not present

## 2023-06-08 DIAGNOSIS — M25562 Pain in left knee: Secondary | ICD-10-CM | POA: Diagnosis not present

## 2023-06-10 DIAGNOSIS — Z9181 History of falling: Secondary | ICD-10-CM | POA: Diagnosis not present

## 2023-06-10 DIAGNOSIS — R2681 Unsteadiness on feet: Secondary | ICD-10-CM | POA: Diagnosis not present

## 2023-06-10 DIAGNOSIS — R2689 Other abnormalities of gait and mobility: Secondary | ICD-10-CM | POA: Diagnosis not present

## 2023-06-10 DIAGNOSIS — M6259 Muscle wasting and atrophy, not elsewhere classified, multiple sites: Secondary | ICD-10-CM | POA: Diagnosis not present

## 2023-06-10 DIAGNOSIS — M25562 Pain in left knee: Secondary | ICD-10-CM | POA: Diagnosis not present

## 2023-06-11 DIAGNOSIS — Z9181 History of falling: Secondary | ICD-10-CM | POA: Diagnosis not present

## 2023-06-11 DIAGNOSIS — R2681 Unsteadiness on feet: Secondary | ICD-10-CM | POA: Diagnosis not present

## 2023-06-11 DIAGNOSIS — M25562 Pain in left knee: Secondary | ICD-10-CM | POA: Diagnosis not present

## 2023-06-11 DIAGNOSIS — M6259 Muscle wasting and atrophy, not elsewhere classified, multiple sites: Secondary | ICD-10-CM | POA: Diagnosis not present

## 2023-06-11 DIAGNOSIS — R2689 Other abnormalities of gait and mobility: Secondary | ICD-10-CM | POA: Diagnosis not present

## 2023-06-15 ENCOUNTER — Non-Acute Institutional Stay: Payer: Self-pay | Admitting: Internal Medicine

## 2023-06-15 ENCOUNTER — Encounter: Payer: Self-pay | Admitting: Internal Medicine

## 2023-06-15 DIAGNOSIS — E785 Hyperlipidemia, unspecified: Secondary | ICD-10-CM | POA: Diagnosis not present

## 2023-06-15 DIAGNOSIS — N401 Enlarged prostate with lower urinary tract symptoms: Secondary | ICD-10-CM

## 2023-06-15 DIAGNOSIS — Z9181 History of falling: Secondary | ICD-10-CM | POA: Diagnosis not present

## 2023-06-15 DIAGNOSIS — G3184 Mild cognitive impairment, so stated: Secondary | ICD-10-CM

## 2023-06-15 DIAGNOSIS — R2681 Unsteadiness on feet: Secondary | ICD-10-CM | POA: Insufficient documentation

## 2023-06-15 DIAGNOSIS — M25562 Pain in left knee: Secondary | ICD-10-CM | POA: Diagnosis not present

## 2023-06-15 DIAGNOSIS — I10 Essential (primary) hypertension: Secondary | ICD-10-CM | POA: Insufficient documentation

## 2023-06-15 DIAGNOSIS — R3914 Feeling of incomplete bladder emptying: Secondary | ICD-10-CM

## 2023-06-15 DIAGNOSIS — M6259 Muscle wasting and atrophy, not elsewhere classified, multiple sites: Secondary | ICD-10-CM | POA: Diagnosis not present

## 2023-06-15 DIAGNOSIS — M170 Bilateral primary osteoarthritis of knee: Secondary | ICD-10-CM | POA: Diagnosis not present

## 2023-06-15 DIAGNOSIS — R2689 Other abnormalities of gait and mobility: Secondary | ICD-10-CM | POA: Diagnosis not present

## 2023-06-15 HISTORY — DX: Benign prostatic hyperplasia with lower urinary tract symptoms: N40.1

## 2023-06-15 HISTORY — DX: Hyperlipidemia, unspecified: E78.5

## 2023-06-15 HISTORY — DX: Mild cognitive impairment of uncertain or unknown etiology: G31.84

## 2023-06-15 HISTORY — DX: Bilateral primary osteoarthritis of knee: M17.0

## 2023-06-15 HISTORY — DX: Essential (primary) hypertension: I10

## 2023-06-15 NOTE — Progress Notes (Signed)
Location:  Medical illustrator of Service:  ALF ([id number])  Provider:   Code Status: DNR Goals of Care:      No data to display           Chief Complaint  Patient presents with   Chronic Care Management    HPI: Patient is a 89 y.o. male seen today for medical management of chronic diseases.   Establish care with [facility]  [place]. 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  Has had few falls in the past 2 years with no injury Per nurses he also has mild urinary incontinence.    Past Medical History:  Diagnosis Date   Arthritis    back and knees   Varicose veins     Past Surgical History:  Procedure Laterality Date   INNER EAR SURGERY     SQUAMOUS CELL CARCINOMA EXCISION     on chest    No Known Allergies  Outpatient Encounter Medications as of 06/15/2023  Medication Sig   ezetimibe (ZETIA) 10 MG tablet Take 10 mg by mouth daily.   fluticasone (FLONASE) 50 MCG/ACT nasal spray Place 1 spray into both nostrils daily as needed for allergies or rhinitis.   galantamine (RAZADYNE ER) 8 MG 24 hr capsule Take 8 mg by mouth daily with breakfast.   losartan (COZAAR) 25 MG tablet Take 25 mg by mouth daily.   melatonin 5 MG TABS Take 5 mg by mouth at bedtime as needed.   finasteride (PROSCAR) 5 MG tablet Take 5 mg by mouth daily.   naproxen sodium (ANAPROX) 220 MG tablet Take 220 mg by mouth as needed.   simvastatin (ZOCOR) 40 MG tablet Take 40 mg by mouth every evening.   tamsulosin (FLOMAX) 0.4 MG CAPS capsule Take by mouth.   [DISCONTINUED] aspirin 81 MG tablet Take 81 mg by mouth daily.   [DISCONTINUED] COVID-19  mRNA vaccine, Moderna, 100 MCG/0.5ML injection Inject into the muscle.   No facility-administered encounter medications on file as of 06/15/2023.    Review of Systems:  Review of Systems  Constitutional:  Negative for activity change, appetite change and unexpected weight change.  HENT: Negative.    Respiratory:  Negative for cough and shortness of breath.   Cardiovascular:  Negative for leg swelling.  Gastrointestinal:  Negative for constipation.  Genitourinary:  Positive for frequency and urgency.  Musculoskeletal:  Positive for gait problem. Negative for arthralgias and myalgias.  Skin: Negative.  Negative for rash.  Neurological:  Negative for dizziness and weakness.  Psychiatric/Behavioral:  Negative for confusion and sleep disturbance.   All other systems reviewed and are negative.   Health Maintenance  Topic Date Due   Pneumonia Vaccine 49+ Years old (1 of 2 - PCV) Never done   DTaP/Tdap/Td (1 - Tdap) Never done   Zoster Vaccines- Shingrix (1 of 2) 09/12/1986   COVID-19 Vaccine (1 - 2023-24 season) 07/25/2022   Medicare Annual Wellness (AWV)  12/20/2022   INFLUENZA VACCINE  06/25/2023   HPV VACCINES  Aged Out    Physical Exam: Vitals:   06/15/23 1355  BP: (!) 166/85  Pulse: (!) 59  Resp: 18  Temp: (!) 97 F (36.1 C)  Weight: 207 lb (93.9 kg)   Body mass index is 29.7 kg/m. Physical Exam Vitals reviewed.  Constitutional:      Appearance: Normal appearance.  HENT:     Head: Normocephalic.     Nose: Nose normal.     Mouth/Throat:     Mouth: Mucous membranes are moist.     Pharynx: Oropharynx is clear.  Eyes:     Pupils: Pupils are equal, round, and reactive to light.  Cardiovascular:     Rate and Rhythm: Normal rate and regular rhythm.     Pulses: Normal pulses.     Heart sounds: No murmur heard. Pulmonary:     Effort: Pulmonary effort is normal. No respiratory distress.     Breath sounds: Normal breath sounds. No rales.  Abdominal:     General:  Abdomen is flat. Bowel sounds are normal.     Palpations: Abdomen is soft.  Musculoskeletal:        General: No swelling.     Cervical back: Neck supple.  Skin:    General: Skin is warm.  Neurological:     General: No focal deficit present.     Mental Status: He is alert and oriented to person, place, and time.     Comments: Gait stable with his Walker  Psychiatric:        Mood and Affect: Mood normal.        Thought Content: Thought content normal.     Labs reviewed: Basic Metabolic Panel: No results for input(s): "NA", "K", "CL", "CO2", "GLUCOSE", "BUN", "CREATININE", "CALCIUM", "MG", "PHOS", "TSH" in the last 8760 hours. Liver Function Tests: No results for input(s): "AST", "ALT", "ALKPHOS", "BILITOT", "PROT", "ALBUMIN" in the last 8760 hours. No results for input(s): "LIPASE", "AMYLASE" in the last 8760 hours. No results for input(s): "AMMONIA" in the last 8760 hours. CBC: No results for input(s): "WBC", "NEUTROABS", "HGB", "HCT", "MCV", "PLT" in the last 8760 hours. Lipid Panel: No results for input(s): "CHOL", "HDL", "LDLCALC", "TRIG", "CHOLHDL", "LDLDIRECT" in the last 8760 hours. No results found for: "HGBA1C"  Procedures since last visit: No results found.  Assessment/Plan 1. Primary hypertension BP mildly Elevated in facility Will Check every day for 1 week before I change Cozaar  2. Hyperlipidemia, unspecified hyperlipidemia type Get labs from Dr Darcus Austin for labs Statin and Zetia  3. Benign prostatic hyperplasia with incomplete bladder emptying Proscar and Flomax  4. Unstable gait Therapy  And Walker  5. Primary osteoarthritis of both knees Sees [facility] for his knees  6. Mild cognitive impairment MMSE in [place] was 30/30 He is does his own finances right now    Labs/tests ordered:   Next appt:  Visit date not found

## 2023-06-17 DIAGNOSIS — R2689 Other abnormalities of gait and mobility: Secondary | ICD-10-CM | POA: Diagnosis not present

## 2023-06-17 DIAGNOSIS — M6259 Muscle wasting and atrophy, not elsewhere classified, multiple sites: Secondary | ICD-10-CM | POA: Diagnosis not present

## 2023-06-17 DIAGNOSIS — Z9181 History of falling: Secondary | ICD-10-CM | POA: Diagnosis not present

## 2023-06-17 DIAGNOSIS — M25562 Pain in left knee: Secondary | ICD-10-CM | POA: Diagnosis not present

## 2023-06-17 DIAGNOSIS — R2681 Unsteadiness on feet: Secondary | ICD-10-CM | POA: Diagnosis not present

## 2023-06-24 DIAGNOSIS — Z9181 History of falling: Secondary | ICD-10-CM | POA: Diagnosis not present

## 2023-06-24 DIAGNOSIS — R2689 Other abnormalities of gait and mobility: Secondary | ICD-10-CM | POA: Diagnosis not present

## 2023-06-24 DIAGNOSIS — M25562 Pain in left knee: Secondary | ICD-10-CM | POA: Diagnosis not present

## 2023-06-24 DIAGNOSIS — R2681 Unsteadiness on feet: Secondary | ICD-10-CM | POA: Diagnosis not present

## 2023-06-24 DIAGNOSIS — M6259 Muscle wasting and atrophy, not elsewhere classified, multiple sites: Secondary | ICD-10-CM | POA: Diagnosis not present

## 2023-06-25 DIAGNOSIS — R2689 Other abnormalities of gait and mobility: Secondary | ICD-10-CM | POA: Diagnosis not present

## 2023-06-25 DIAGNOSIS — M25562 Pain in left knee: Secondary | ICD-10-CM | POA: Diagnosis not present

## 2023-06-25 DIAGNOSIS — M6259 Muscle wasting and atrophy, not elsewhere classified, multiple sites: Secondary | ICD-10-CM | POA: Diagnosis not present

## 2023-06-25 DIAGNOSIS — R2681 Unsteadiness on feet: Secondary | ICD-10-CM | POA: Diagnosis not present

## 2023-06-25 DIAGNOSIS — Z9181 History of falling: Secondary | ICD-10-CM | POA: Diagnosis not present

## 2023-06-29 DIAGNOSIS — R2681 Unsteadiness on feet: Secondary | ICD-10-CM | POA: Diagnosis not present

## 2023-06-29 DIAGNOSIS — M25562 Pain in left knee: Secondary | ICD-10-CM | POA: Diagnosis not present

## 2023-06-29 DIAGNOSIS — Z9181 History of falling: Secondary | ICD-10-CM | POA: Diagnosis not present

## 2023-06-29 DIAGNOSIS — M6259 Muscle wasting and atrophy, not elsewhere classified, multiple sites: Secondary | ICD-10-CM | POA: Diagnosis not present

## 2023-06-29 DIAGNOSIS — R2689 Other abnormalities of gait and mobility: Secondary | ICD-10-CM | POA: Diagnosis not present

## 2023-07-02 DIAGNOSIS — M170 Bilateral primary osteoarthritis of knee: Secondary | ICD-10-CM | POA: Diagnosis not present

## 2023-07-08 DIAGNOSIS — L814 Other melanin hyperpigmentation: Secondary | ICD-10-CM | POA: Diagnosis not present

## 2023-07-08 DIAGNOSIS — D225 Melanocytic nevi of trunk: Secondary | ICD-10-CM | POA: Diagnosis not present

## 2023-07-08 DIAGNOSIS — L570 Actinic keratosis: Secondary | ICD-10-CM | POA: Diagnosis not present

## 2023-07-08 DIAGNOSIS — Z09 Encounter for follow-up examination after completed treatment for conditions other than malignant neoplasm: Secondary | ICD-10-CM | POA: Diagnosis not present

## 2023-07-08 DIAGNOSIS — L578 Other skin changes due to chronic exposure to nonionizing radiation: Secondary | ICD-10-CM | POA: Diagnosis not present

## 2023-07-08 DIAGNOSIS — L821 Other seborrheic keratosis: Secondary | ICD-10-CM | POA: Diagnosis not present

## 2023-07-09 DIAGNOSIS — M170 Bilateral primary osteoarthritis of knee: Secondary | ICD-10-CM | POA: Diagnosis not present

## 2023-07-10 DIAGNOSIS — N390 Urinary tract infection, site not specified: Secondary | ICD-10-CM | POA: Diagnosis not present

## 2023-07-16 DIAGNOSIS — M170 Bilateral primary osteoarthritis of knee: Secondary | ICD-10-CM | POA: Diagnosis not present

## 2023-08-10 DIAGNOSIS — R3914 Feeling of incomplete bladder emptying: Secondary | ICD-10-CM | POA: Diagnosis not present

## 2023-08-10 DIAGNOSIS — N401 Enlarged prostate with lower urinary tract symptoms: Secondary | ICD-10-CM | POA: Diagnosis not present

## 2023-08-11 DIAGNOSIS — E1159 Type 2 diabetes mellitus with other circulatory complications: Secondary | ICD-10-CM | POA: Diagnosis not present

## 2023-08-11 DIAGNOSIS — L84 Corns and callosities: Secondary | ICD-10-CM | POA: Diagnosis not present

## 2023-08-11 DIAGNOSIS — B351 Tinea unguium: Secondary | ICD-10-CM | POA: Diagnosis not present

## 2023-08-18 ENCOUNTER — Ambulatory Visit: Payer: Medicare Other | Admitting: Internal Medicine

## 2023-08-18 ENCOUNTER — Encounter: Payer: Self-pay | Admitting: Internal Medicine

## 2023-08-18 VITALS — BP 158/74 | HR 72 | Temp 97.9°F | Resp 17 | Ht 70.0 in | Wt 196.0 lb

## 2023-08-18 DIAGNOSIS — R2681 Unsteadiness on feet: Secondary | ICD-10-CM | POA: Diagnosis not present

## 2023-08-18 DIAGNOSIS — N401 Enlarged prostate with lower urinary tract symptoms: Secondary | ICD-10-CM | POA: Diagnosis not present

## 2023-08-18 DIAGNOSIS — G629 Polyneuropathy, unspecified: Secondary | ICD-10-CM | POA: Diagnosis not present

## 2023-08-18 DIAGNOSIS — I10 Essential (primary) hypertension: Secondary | ICD-10-CM | POA: Diagnosis not present

## 2023-08-18 DIAGNOSIS — E785 Hyperlipidemia, unspecified: Secondary | ICD-10-CM

## 2023-08-18 DIAGNOSIS — M170 Bilateral primary osteoarthritis of knee: Secondary | ICD-10-CM | POA: Diagnosis not present

## 2023-08-18 DIAGNOSIS — G3184 Mild cognitive impairment, so stated: Secondary | ICD-10-CM | POA: Diagnosis not present

## 2023-08-18 DIAGNOSIS — R3914 Feeling of incomplete bladder emptying: Secondary | ICD-10-CM

## 2023-08-18 DIAGNOSIS — N3946 Mixed incontinence: Secondary | ICD-10-CM | POA: Diagnosis not present

## 2023-08-18 NOTE — Progress Notes (Signed)
Location:   [facility]   Place of Service:   [facility]  Provider:   Code Status: DNR Goals of Care:     08/18/2023   10:03 AM  Advanced Directives  Does Patient Have a Medical Advance Directive? Yes  Type of Estate agent of [place];Living will;Out of facility DNR (pink MOST or yellow form)  Does patient want to make changes to medical advance directive? No - Patient declined  Copy of Healthcare Power of Attorney in Chart? No - copy requested     Chief Complaint  Patient presents with   New Patient (Initial Visit)    Patient is being seen to establish care. Patient has no other concerns   Immunizations    Patient is scheduled for covid vaccine ,but need tdap and flu vaccine    HPI: Patient is a 89 y.o. male seen today for medical management of chronic diseases.   [place]. 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  daughter is infectious disease Physician in [place] and 1 son in [place] who helps him He did not have any acute complaints except Caregiver stress due to his wife  Acute issue Recent Cataract surgery Given up his Driving  Knee pain  Sees [facility] Unstable gait Had fall one time in [place] and now using Walker Does have unstable gait  No Dizziness no leg weakness  Does have h/o Back pain Per Dr Scherry Ran it is Lumbar stenosis    Past Medical History:  Diagnosis Date   Arthritis    back and knees   Varicose veins     Past Surgical History:  Procedure Laterality Date   INNER EAR SURGERY     SQUAMOUS CELL CARCINOMA EXCISION     on chest    Allergies  Allergen Reactions   Atorvastatin Other (See Comments)    Other Reaction(s): did had some extra aches on this one.    Outpatient Encounter Medications as of 08/18/2023  Medication Sig   ezetimibe (ZETIA) 10 MG tablet  Take 10 mg by mouth daily.   finasteride (PROSCAR) 5 MG tablet Take 5 mg by mouth daily.   fluticasone (FLONASE) 50 MCG/ACT nasal spray Place 1 spray into both nostrils daily as needed for allergies or rhinitis.   galantamine (RAZADYNE ER) 8 MG 24 hr capsule Take 8 mg by mouth daily with breakfast.   losartan (COZAAR) 25 MG tablet Take 25 mg by mouth daily.   melatonin 5 MG TABS Take 5 mg by mouth at bedtime as needed.   naproxen sodium (ANAPROX) 220 MG tablet Take 220 mg by mouth as needed.   simvastatin (ZOCOR) 40 MG tablet Take 40 mg by mouth every evening.   tamsulosin (FLOMAX) 0.4 MG CAPS capsule Take by mouth.   No facility-administered encounter medications on file as of 08/18/2023.    Review of Systems:  Review of Systems  Constitutional:  Negative for activity change, appetite change and unexpected weight change.  HENT: Negative.    Respiratory:  Negative for cough and shortness of breath.   Cardiovascular:  Negative for leg swelling.  Gastrointestinal:  Negative for constipation.  Genitourinary:  Positive for frequency.  Musculoskeletal:  Positive for arthralgias and gait problem. Negative for myalgias.  Skin: Negative.  Negative for rash.  Neurological:  Negative for dizziness and weakness.  Psychiatric/Behavioral:  Negative  for confusion and sleep disturbance.   All other systems reviewed and are negative.   Health Maintenance  Topic Date Due   DTaP/Tdap/Td (3 - Td or Tdap) 03/28/2020   Medicare Annual Wellness (AWV)  12/20/2022   INFLUENZA VACCINE  06/25/2023   COVID-19 Vaccine (1 - 2023-24 season) 07/26/2023   Zoster Vaccines- Shingrix (1 of 2) 11/17/2023 (Originally 09/12/1986)   Pneumonia Vaccine 58+ Years old (2 of 2 - PCV) 08/17/2024 (Originally 12/20/2022)   HPV VACCINES  Aged Out    Physical Exam: Vitals:   08/18/23 0958  BP: (!) 158/74  Pulse: 72  Resp: 17  Temp: 97.9 F (36.6 C)  TempSrc: Temporal  SpO2: 94%  Weight: 196 lb (88.9 kg)  Height: 5'  10" (1.778 m)   Body mass index is 28.12 kg/m. Physical Exam Vitals reviewed.  Constitutional:      Appearance: Normal appearance.  HENT:     Head: Normocephalic.     Right Ear: Tympanic membrane normal.     Left Ear: Tympanic membrane normal.     Ears:     Comments: Wax in Both ears    Nose: Nose normal.     Mouth/Throat:     Mouth: Mucous membranes are moist.     Pharynx: Oropharynx is clear.  Eyes:     Pupils: Pupils are equal, round, and reactive to light.  Cardiovascular:     Rate and Rhythm: Normal rate and regular rhythm.     Pulses: Normal pulses.     Heart sounds: No murmur heard. Pulmonary:     Effort: Pulmonary effort is normal. No respiratory distress.     Breath sounds: Normal breath sounds. No rales.  Abdominal:     General: Abdomen is flat. Bowel sounds are normal.     Palpations: Abdomen is soft.  Musculoskeletal:        General: Swelling present.     Cervical back: Neck supple.     Comments: Mild edema Bilateral  Skin:    General: Skin is warm.  Neurological:     General: No focal deficit present.     Mental Status: He is alert and oriented to person, place, and time.     Comments: Mild Sensory loss Bilateral  Psychiatric:        Mood and Affect: Mood normal.        Thought Content: Thought content normal.     Labs reviewed: Basic Metabolic Panel: No results for input(s): "NA", "K", "CL", "CO2", "GLUCOSE", "BUN", "CREATININE", "CALCIUM", "MG", "PHOS", "TSH" in the last 8760 hours. Liver Function Tests: No results for input(s): "AST", "ALT", "ALKPHOS", "BILITOT", "PROT", "ALBUMIN" in the last 8760 hours. No results for input(s): "LIPASE", "AMYLASE" in the last 8760 hours. No results for input(s): "AMMONIA" in the last 8760 hours. CBC: No results for input(s): "WBC", "NEUTROABS", "HGB", "HCT", "MCV", "PLT" in the last 8760 hours. Lipid Panel: No results for input(s): "CHOL", "HDL", "LDLCALC", "TRIG", "CHOLHDL", "LDLDIRECT" in the last 8760  hours. No results found for: "HGBA1C"  Procedures since last visit: No results found.  Assessment/Plan 1. Primary hypertension His BP here was elevated here but most of the BP in [place] are in good range Will not change the dose of Losartan right now  2. Hyperlipidemia, unspecified hyperlipidemia type On Statin and Zetia Repeat Lipid panel  3. 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  Primary osteoarthritis of both knees Sees Ortho for Injections  6. Mild cognitive impairment In [place] MMSE has been 30/30 On Galantamine per his Previous Physician 7. Unstable gait Has worked with therapy Doing exercises Combination of Arthritis and Neuropathy Use walker all the time 8. Neuropathy Very Mild Will check B 12 level and TSH 9 Ear Wax  Needs Ear wash  Wrote for TDAP and Shigrix  Labs/tests ordered:  CMP,CBC,TSH,Lipid,B 12 Next appt:  Visit date not found

## 2023-09-15 DIAGNOSIS — Z23 Encounter for immunization: Secondary | ICD-10-CM | POA: Diagnosis not present

## 2023-09-29 DIAGNOSIS — M6259 Muscle wasting and atrophy, not elsewhere classified, multiple sites: Secondary | ICD-10-CM | POA: Diagnosis not present

## 2023-09-29 DIAGNOSIS — R296 Repeated falls: Secondary | ICD-10-CM | POA: Diagnosis not present

## 2023-09-29 DIAGNOSIS — R2689 Other abnormalities of gait and mobility: Secondary | ICD-10-CM | POA: Diagnosis not present

## 2023-09-29 DIAGNOSIS — Z9181 History of falling: Secondary | ICD-10-CM | POA: Diagnosis not present

## 2023-09-29 DIAGNOSIS — M6389 Disorders of muscle in diseases classified elsewhere, multiple sites: Secondary | ICD-10-CM | POA: Diagnosis not present

## 2023-09-29 DIAGNOSIS — R278 Other lack of coordination: Secondary | ICD-10-CM | POA: Diagnosis not present

## 2023-10-02 DIAGNOSIS — R2689 Other abnormalities of gait and mobility: Secondary | ICD-10-CM | POA: Diagnosis not present

## 2023-10-02 DIAGNOSIS — R296 Repeated falls: Secondary | ICD-10-CM | POA: Diagnosis not present

## 2023-10-02 DIAGNOSIS — R278 Other lack of coordination: Secondary | ICD-10-CM | POA: Diagnosis not present

## 2023-10-02 DIAGNOSIS — Z9181 History of falling: Secondary | ICD-10-CM | POA: Diagnosis not present

## 2023-10-02 DIAGNOSIS — M6259 Muscle wasting and atrophy, not elsewhere classified, multiple sites: Secondary | ICD-10-CM | POA: Diagnosis not present

## 2023-10-02 DIAGNOSIS — M6389 Disorders of muscle in diseases classified elsewhere, multiple sites: Secondary | ICD-10-CM | POA: Diagnosis not present

## 2023-10-05 DIAGNOSIS — Z9181 History of falling: Secondary | ICD-10-CM | POA: Diagnosis not present

## 2023-10-05 DIAGNOSIS — R2689 Other abnormalities of gait and mobility: Secondary | ICD-10-CM | POA: Diagnosis not present

## 2023-10-05 DIAGNOSIS — M6259 Muscle wasting and atrophy, not elsewhere classified, multiple sites: Secondary | ICD-10-CM | POA: Diagnosis not present

## 2023-10-05 DIAGNOSIS — R278 Other lack of coordination: Secondary | ICD-10-CM | POA: Diagnosis not present

## 2023-10-05 DIAGNOSIS — R296 Repeated falls: Secondary | ICD-10-CM | POA: Diagnosis not present

## 2023-10-05 DIAGNOSIS — M6389 Disorders of muscle in diseases classified elsewhere, multiple sites: Secondary | ICD-10-CM | POA: Diagnosis not present

## 2023-10-07 DIAGNOSIS — M6389 Disorders of muscle in diseases classified elsewhere, multiple sites: Secondary | ICD-10-CM | POA: Diagnosis not present

## 2023-10-07 DIAGNOSIS — M6259 Muscle wasting and atrophy, not elsewhere classified, multiple sites: Secondary | ICD-10-CM | POA: Diagnosis not present

## 2023-10-07 DIAGNOSIS — R296 Repeated falls: Secondary | ICD-10-CM | POA: Diagnosis not present

## 2023-10-07 DIAGNOSIS — R2689 Other abnormalities of gait and mobility: Secondary | ICD-10-CM | POA: Diagnosis not present

## 2023-10-07 DIAGNOSIS — R278 Other lack of coordination: Secondary | ICD-10-CM | POA: Diagnosis not present

## 2023-10-07 DIAGNOSIS — Z9181 History of falling: Secondary | ICD-10-CM | POA: Diagnosis not present

## 2023-10-12 DIAGNOSIS — R296 Repeated falls: Secondary | ICD-10-CM | POA: Diagnosis not present

## 2023-10-12 DIAGNOSIS — M6389 Disorders of muscle in diseases classified elsewhere, multiple sites: Secondary | ICD-10-CM | POA: Diagnosis not present

## 2023-10-12 DIAGNOSIS — R278 Other lack of coordination: Secondary | ICD-10-CM | POA: Diagnosis not present

## 2023-10-12 DIAGNOSIS — R2689 Other abnormalities of gait and mobility: Secondary | ICD-10-CM | POA: Diagnosis not present

## 2023-10-12 DIAGNOSIS — Z9181 History of falling: Secondary | ICD-10-CM | POA: Diagnosis not present

## 2023-10-12 DIAGNOSIS — M6259 Muscle wasting and atrophy, not elsewhere classified, multiple sites: Secondary | ICD-10-CM | POA: Diagnosis not present

## 2023-10-18 DIAGNOSIS — M6389 Disorders of muscle in diseases classified elsewhere, multiple sites: Secondary | ICD-10-CM | POA: Diagnosis not present

## 2023-10-18 DIAGNOSIS — R2689 Other abnormalities of gait and mobility: Secondary | ICD-10-CM | POA: Diagnosis not present

## 2023-10-18 DIAGNOSIS — M6259 Muscle wasting and atrophy, not elsewhere classified, multiple sites: Secondary | ICD-10-CM | POA: Diagnosis not present

## 2023-10-18 DIAGNOSIS — R278 Other lack of coordination: Secondary | ICD-10-CM | POA: Diagnosis not present

## 2023-10-18 DIAGNOSIS — R296 Repeated falls: Secondary | ICD-10-CM | POA: Diagnosis not present

## 2023-10-18 DIAGNOSIS — Z9181 History of falling: Secondary | ICD-10-CM | POA: Diagnosis not present

## 2023-10-19 DIAGNOSIS — R278 Other lack of coordination: Secondary | ICD-10-CM | POA: Diagnosis not present

## 2023-10-19 DIAGNOSIS — M6259 Muscle wasting and atrophy, not elsewhere classified, multiple sites: Secondary | ICD-10-CM | POA: Diagnosis not present

## 2023-10-19 DIAGNOSIS — R296 Repeated falls: Secondary | ICD-10-CM | POA: Diagnosis not present

## 2023-10-19 DIAGNOSIS — Z9181 History of falling: Secondary | ICD-10-CM | POA: Diagnosis not present

## 2023-10-19 DIAGNOSIS — M6389 Disorders of muscle in diseases classified elsewhere, multiple sites: Secondary | ICD-10-CM | POA: Diagnosis not present

## 2023-10-19 DIAGNOSIS — R2689 Other abnormalities of gait and mobility: Secondary | ICD-10-CM | POA: Diagnosis not present

## 2023-10-26 DIAGNOSIS — M6389 Disorders of muscle in diseases classified elsewhere, multiple sites: Secondary | ICD-10-CM | POA: Diagnosis not present

## 2023-10-26 DIAGNOSIS — R278 Other lack of coordination: Secondary | ICD-10-CM | POA: Diagnosis not present

## 2023-10-26 DIAGNOSIS — Z9181 History of falling: Secondary | ICD-10-CM | POA: Diagnosis not present

## 2023-10-26 DIAGNOSIS — R2689 Other abnormalities of gait and mobility: Secondary | ICD-10-CM | POA: Diagnosis not present

## 2023-10-26 DIAGNOSIS — R296 Repeated falls: Secondary | ICD-10-CM | POA: Diagnosis not present

## 2023-10-26 DIAGNOSIS — M6259 Muscle wasting and atrophy, not elsewhere classified, multiple sites: Secondary | ICD-10-CM | POA: Diagnosis not present

## 2023-10-28 DIAGNOSIS — R278 Other lack of coordination: Secondary | ICD-10-CM | POA: Diagnosis not present

## 2023-10-28 DIAGNOSIS — M6259 Muscle wasting and atrophy, not elsewhere classified, multiple sites: Secondary | ICD-10-CM | POA: Diagnosis not present

## 2023-10-28 DIAGNOSIS — Z9181 History of falling: Secondary | ICD-10-CM | POA: Diagnosis not present

## 2023-10-28 DIAGNOSIS — R296 Repeated falls: Secondary | ICD-10-CM | POA: Diagnosis not present

## 2023-10-28 DIAGNOSIS — R2689 Other abnormalities of gait and mobility: Secondary | ICD-10-CM | POA: Diagnosis not present

## 2023-10-28 DIAGNOSIS — M6389 Disorders of muscle in diseases classified elsewhere, multiple sites: Secondary | ICD-10-CM | POA: Diagnosis not present

## 2023-11-02 DIAGNOSIS — Z9181 History of falling: Secondary | ICD-10-CM | POA: Diagnosis not present

## 2023-11-02 DIAGNOSIS — R296 Repeated falls: Secondary | ICD-10-CM | POA: Diagnosis not present

## 2023-11-02 DIAGNOSIS — R278 Other lack of coordination: Secondary | ICD-10-CM | POA: Diagnosis not present

## 2023-11-02 DIAGNOSIS — R2689 Other abnormalities of gait and mobility: Secondary | ICD-10-CM | POA: Diagnosis not present

## 2023-11-02 DIAGNOSIS — M6389 Disorders of muscle in diseases classified elsewhere, multiple sites: Secondary | ICD-10-CM | POA: Diagnosis not present

## 2023-11-02 DIAGNOSIS — M6259 Muscle wasting and atrophy, not elsewhere classified, multiple sites: Secondary | ICD-10-CM | POA: Diagnosis not present

## 2023-11-03 DIAGNOSIS — R2689 Other abnormalities of gait and mobility: Secondary | ICD-10-CM | POA: Diagnosis not present

## 2023-11-03 DIAGNOSIS — M6389 Disorders of muscle in diseases classified elsewhere, multiple sites: Secondary | ICD-10-CM | POA: Diagnosis not present

## 2023-11-03 DIAGNOSIS — R296 Repeated falls: Secondary | ICD-10-CM | POA: Diagnosis not present

## 2023-11-03 DIAGNOSIS — R278 Other lack of coordination: Secondary | ICD-10-CM | POA: Diagnosis not present

## 2023-11-03 DIAGNOSIS — M6259 Muscle wasting and atrophy, not elsewhere classified, multiple sites: Secondary | ICD-10-CM | POA: Diagnosis not present

## 2023-11-03 DIAGNOSIS — Z9181 History of falling: Secondary | ICD-10-CM | POA: Diagnosis not present

## 2023-11-04 DIAGNOSIS — M6259 Muscle wasting and atrophy, not elsewhere classified, multiple sites: Secondary | ICD-10-CM | POA: Diagnosis not present

## 2023-11-04 DIAGNOSIS — Z9181 History of falling: Secondary | ICD-10-CM | POA: Diagnosis not present

## 2023-11-04 DIAGNOSIS — R296 Repeated falls: Secondary | ICD-10-CM | POA: Diagnosis not present

## 2023-11-04 DIAGNOSIS — M6389 Disorders of muscle in diseases classified elsewhere, multiple sites: Secondary | ICD-10-CM | POA: Diagnosis not present

## 2023-11-04 DIAGNOSIS — R2689 Other abnormalities of gait and mobility: Secondary | ICD-10-CM | POA: Diagnosis not present

## 2023-11-04 DIAGNOSIS — R278 Other lack of coordination: Secondary | ICD-10-CM | POA: Diagnosis not present

## 2023-11-05 DIAGNOSIS — R296 Repeated falls: Secondary | ICD-10-CM | POA: Diagnosis not present

## 2023-11-05 DIAGNOSIS — R2689 Other abnormalities of gait and mobility: Secondary | ICD-10-CM | POA: Diagnosis not present

## 2023-11-05 DIAGNOSIS — M6389 Disorders of muscle in diseases classified elsewhere, multiple sites: Secondary | ICD-10-CM | POA: Diagnosis not present

## 2023-11-05 DIAGNOSIS — R278 Other lack of coordination: Secondary | ICD-10-CM | POA: Diagnosis not present

## 2023-11-05 DIAGNOSIS — Z9181 History of falling: Secondary | ICD-10-CM | POA: Diagnosis not present

## 2023-11-05 DIAGNOSIS — M6259 Muscle wasting and atrophy, not elsewhere classified, multiple sites: Secondary | ICD-10-CM | POA: Diagnosis not present

## 2023-11-10 DIAGNOSIS — R278 Other lack of coordination: Secondary | ICD-10-CM | POA: Diagnosis not present

## 2023-11-10 DIAGNOSIS — Z9181 History of falling: Secondary | ICD-10-CM | POA: Diagnosis not present

## 2023-11-10 DIAGNOSIS — R296 Repeated falls: Secondary | ICD-10-CM | POA: Diagnosis not present

## 2023-11-10 DIAGNOSIS — M6389 Disorders of muscle in diseases classified elsewhere, multiple sites: Secondary | ICD-10-CM | POA: Diagnosis not present

## 2023-11-10 DIAGNOSIS — R2689 Other abnormalities of gait and mobility: Secondary | ICD-10-CM | POA: Diagnosis not present

## 2023-11-10 DIAGNOSIS — M6259 Muscle wasting and atrophy, not elsewhere classified, multiple sites: Secondary | ICD-10-CM | POA: Diagnosis not present

## 2023-11-12 DIAGNOSIS — M6259 Muscle wasting and atrophy, not elsewhere classified, multiple sites: Secondary | ICD-10-CM | POA: Diagnosis not present

## 2023-11-12 DIAGNOSIS — R296 Repeated falls: Secondary | ICD-10-CM | POA: Diagnosis not present

## 2023-11-12 DIAGNOSIS — Z9181 History of falling: Secondary | ICD-10-CM | POA: Diagnosis not present

## 2023-11-12 DIAGNOSIS — M6389 Disorders of muscle in diseases classified elsewhere, multiple sites: Secondary | ICD-10-CM | POA: Diagnosis not present

## 2023-11-12 DIAGNOSIS — R2689 Other abnormalities of gait and mobility: Secondary | ICD-10-CM | POA: Diagnosis not present

## 2023-11-12 DIAGNOSIS — R278 Other lack of coordination: Secondary | ICD-10-CM | POA: Diagnosis not present

## 2023-11-19 DIAGNOSIS — E785 Hyperlipidemia, unspecified: Secondary | ICD-10-CM | POA: Diagnosis not present

## 2023-11-19 LAB — BASIC METABOLIC PANEL
BUN: 22 — AB (ref 4–21)
CO2: 25 — AB (ref 13–22)
Chloride: 103 (ref 99–108)
Creatinine: 1 (ref 0.6–1.3)
Glucose: 113
Potassium: 4.6 meq/L (ref 3.5–5.1)
Sodium: 137 (ref 137–147)

## 2023-11-19 LAB — COMPREHENSIVE METABOLIC PANEL
Albumin: 4.3 (ref 3.5–5.0)
Calcium: 9.3 (ref 8.7–10.7)
Globulin: 2.4
eGFR: 75

## 2023-11-19 LAB — LIPID PANEL
Cholesterol: 116 (ref 0–200)
HDL: 55 (ref 35–70)
LDL Cholesterol: 46
LDl/HDL Ratio: 2.1
Triglycerides: 73 (ref 40–160)

## 2023-11-19 LAB — CBC AND DIFFERENTIAL
HCT: 44 (ref 41–53)
Hemoglobin: 14.8 (ref 13.5–17.5)
Platelets: 232 10*3/uL (ref 150–400)
WBC: 6.6

## 2023-11-19 LAB — VITAMIN B12: Vitamin B-12: 565

## 2023-11-19 LAB — TSH: TSH: 3.28 (ref 0.41–5.90)

## 2023-11-19 LAB — CBC: RBC: 4.72 (ref 3.87–5.11)

## 2023-11-23 ENCOUNTER — Encounter: Payer: Self-pay | Admitting: Adult Health

## 2023-11-23 ENCOUNTER — Non-Acute Institutional Stay: Payer: Medicare Other | Admitting: Adult Health

## 2023-11-23 VITALS — BP 148/76 | HR 67 | Temp 97.6°F | Resp 18 | Ht 70.0 in | Wt 200.4 lb

## 2023-11-23 DIAGNOSIS — N401 Enlarged prostate with lower urinary tract symptoms: Secondary | ICD-10-CM

## 2023-11-23 DIAGNOSIS — I10 Essential (primary) hypertension: Secondary | ICD-10-CM | POA: Diagnosis not present

## 2023-11-23 DIAGNOSIS — G3184 Mild cognitive impairment, so stated: Secondary | ICD-10-CM | POA: Diagnosis not present

## 2023-11-23 DIAGNOSIS — M170 Bilateral primary osteoarthritis of knee: Secondary | ICD-10-CM

## 2023-11-23 DIAGNOSIS — E782 Mixed hyperlipidemia: Secondary | ICD-10-CM | POA: Diagnosis not present

## 2023-11-23 DIAGNOSIS — R3914 Feeling of incomplete bladder emptying: Secondary | ICD-10-CM

## 2023-11-23 MED ORDER — DICLOFENAC SODIUM 1 % EX GEL: 4.0000 g | Freq: Three times a day (TID) | CUTANEOUS | Status: AC

## 2023-11-23 NOTE — Progress Notes (Signed)
Location:  [facility]  POS: Clinic  Provider: Fletcher Anon, ANP  Code Status: DNR Goals of Care:     11/23/2023    1:26 PM  Advanced Directives  Does Patient Have a Medical Advance Directive? Yes  Type of Estate agent of [place];Living will;Out of facility DNR (pink MOST or yellow form)  Does patient want to make changes to medical advance directive? No - Patient declined  Copy of Healthcare Power of Attorney in Chart? No - copy requested     Chief Complaint  Patient presents with   Medical Management of Chronic Issues    Patient is being seen for a 3 month follow up with labs    Immunizations    Patient is due for tdap vaccine    Health Maintenance    Patient is due for AWV     HPI: Patient is a 89 y.o. male seen today for medical management of chronic diseases.    Lives in [place] with his wife His wife has progressive dementia and he helps care for her.  Retired Sports administrator.   Hx of knee pain and back with/lumbar stenosis Lack of cartilage in both knees Left worse than right, chronic back pain Has had knee injections   Cognitive impairment  MMSE 26/30 Uses walker, no assistance needed with bathing or dressing No issues with memory by his account, staff did express concern Working with therapy for re conditioning  BPH: slow stream, dribbles, wears a pad due to urge incontinence Get up once per night to urinate  HLD: LDL 46   HTN: 148/76  Neuropathy : denies any issues Past Medical History:  Diagnosis Date   Arthritis    back and knees   Benign prostatic hyperplasia with incomplete bladder emptying 06/15/2023   Hyperlipidemia 06/15/2023   Mild cognitive impairment 06/15/2023   Primary hypertension 06/15/2023   Primary osteoarthritis of both knees 06/15/2023   Varicose veins    Varicose veins of bilateral lower extremities with other complications 08/03/2013   IMO SNOMED Dx Update Oct 2024      Past Surgical  History:  Procedure Laterality Date   INNER EAR SURGERY     SQUAMOUS CELL CARCINOMA EXCISION     on chest    Allergies  Allergen Reactions   Atorvastatin Other (See Comments)    Other Reaction(s): did had some extra aches on this one.    Outpatient Encounter Medications as of 11/23/2023  Medication Sig   ezetimibe (ZETIA) 10 MG tablet Take 10 mg by mouth daily.   finasteride (PROSCAR) 5 MG tablet Take 5 mg by mouth daily.   fluticasone (FLONASE) 50 MCG/ACT nasal spray Place 1 spray into both nostrils daily as needed for allergies or rhinitis.   galantamine (RAZADYNE ER) 8 MG 24 hr capsule Take 8 mg by mouth daily with breakfast.   losartan (COZAAR) 25 MG tablet Take 25 mg by mouth daily.   simvastatin (ZOCOR) 40 MG tablet Take 40 mg by mouth every evening.   tamsulosin (FLOMAX) 0.4 MG CAPS capsule Take by mouth.   [DISCONTINUED] melatonin 5 MG TABS Take 5 mg by mouth at bedtime as needed. (Patient not taking: Reported on 11/23/2023)   [DISCONTINUED] naproxen sodium (ANAPROX) 220 MG tablet Take 220 mg by mouth as needed. (Patient not taking: Reported on 11/23/2023)   No facility-administered encounter medications on file as of 11/23/2023.    Review of Systems:  Review of Systems  Constitutional:  Negative for activity change,  appetite change, chills, diaphoresis, fatigue, fever and unexpected weight change.  Respiratory:  Negative for cough, shortness of breath, wheezing and stridor.   Cardiovascular:  Negative for chest pain, palpitations and leg swelling.  Gastrointestinal:  Negative for abdominal distention, abdominal pain, constipation and diarrhea.  Genitourinary:  Positive for frequency and urgency. Negative for difficulty urinating, dysuria, genital sores, hematuria, penile discharge, penile pain and penile swelling.  Musculoskeletal:  Positive for arthralgias and gait problem. Negative for back pain, joint swelling and myalgias.  Neurological:  Negative for dizziness,  seizures, syncope, facial asymmetry, speech difficulty, weakness and headaches.  Hematological:  Negative for adenopathy. Does not bruise/bleed easily.  Psychiatric/Behavioral:  Negative for agitation, behavioral problems and confusion.     Health Maintenance  Topic Date Due   DTaP/Tdap/Td (4 - Td or Tdap) 03/28/2020   Medicare Annual Wellness (AWV)  12/20/2022   COVID-19 Vaccine (9 - 2024-25 season) 12/09/2023 (Originally 11/10/2023)   Pneumonia Vaccine 75+ Years old  Completed   INFLUENZA VACCINE  Completed   Zoster Vaccines- Shingrix  Completed   HPV VACCINES  Aged Out    Physical Exam: Vitals:   11/23/23 1328 11/23/23 1329  BP: (!) 150/78 (!) 148/76  Pulse: 67   Resp: 18   Temp: 97.6 F (36.4 C)   TempSrc: Temporal   SpO2: 97%   Weight: 200 lb 6.4 oz (90.9 kg)   Height: 5\' 10"  (1.778 m)    Body mass index is 28.75 kg/m. Physical Exam Vitals and nursing note reviewed.  Constitutional:      General: He is not in acute distress.    Appearance: He is not diaphoretic.  HENT:     Head: Normocephalic and atraumatic.     Right Ear: Tympanic membrane normal.     Left Ear: Tympanic membrane normal.     Nose: Nose normal.     Mouth/Throat:     Mouth: Mucous membranes are moist.     Pharynx: Oropharynx is clear.  Eyes:     Conjunctiva/sclera: Conjunctivae normal.     Pupils: Pupils are equal, round, and reactive to light.  Neck:     Thyroid: No thyromegaly.     Vascular: No JVD.     Trachea: No tracheal deviation.  Cardiovascular:     Rate and Rhythm: Normal rate and regular rhythm.     Heart sounds: No murmur heard. Pulmonary:     Effort: Pulmonary effort is normal. No respiratory distress.     Breath sounds: Normal breath sounds. No wheezing.  Abdominal:     General: Bowel sounds are normal. There is no distension.     Palpations: Abdomen is soft.     Tenderness: There is no abdominal tenderness.  Musculoskeletal:     Cervical back: Normal range of motion and  neck supple.     Right lower leg: No edema.     Left lower leg: No edema.     Comments: Crepitus to both knees  Lymphadenopathy:     Cervical: No cervical adenopathy.  Skin:    General: Skin is warm and dry.  Neurological:     Mental Status: He is alert and oriented to person, place, and time.     Cranial Nerves: No cranial nerve deficit.  Psychiatric:        Mood and Affect: Mood normal.     Labs reviewed: Basic Metabolic Panel: No results for input(s): "NA", "K", "CL", "CO2", "GLUCOSE", "BUN", "CREATININE", "CALCIUM", "MG", "PHOS", "TSH" in the last 8760  hours. Liver Function Tests: No results for input(s): "AST", "ALT", "ALKPHOS", "BILITOT", "PROT", "ALBUMIN" in the last 8760 hours. No results for input(s): "LIPASE", "AMYLASE" in the last 8760 hours. No results for input(s): "AMMONIA" in the last 8760 hours. CBC: No results for input(s): "WBC", "NEUTROABS", "HGB", "HCT", "MCV", "PLT" in the last 8760 hours. Lipid Panel: No results for input(s): "CHOL", "HDL", "LDLCALC", "TRIG", "CHOLHDL", "LDLDIRECT" in the last 8760 hours. No results found for: "HGBA1C"  Procedures since last visit: No results found.  Assessment/Plan  1. Primary osteoarthritis of both knees (Primary)  - diclofenac Sodium (VOLTAREN) 1 % GEL; Apply 4 g topically in the morning, at noon, and at bedtime trial for 2 weeks   2. Mild cognitive impairment Appropriate for [place] Continue Razadyne  3. Primary hypertension Controlled  Continue Losartan   4. Benign prostatic hyperplasia with incomplete bladder emptying Followed by urology On Flomax and Proscar  5. Mixed hyperlipidemia Continue Zocor    Labs/tests ordered:  * No order type specified * Next appt:  3 month f/u with Dr Chales Abrahams   Recommend tdap vaccine.    Total time :  time greater than 50% of total time spent doing pt counseling and coordination of care

## 2023-11-24 DIAGNOSIS — M6259 Muscle wasting and atrophy, not elsewhere classified, multiple sites: Secondary | ICD-10-CM | POA: Diagnosis not present

## 2023-11-24 DIAGNOSIS — R2689 Other abnormalities of gait and mobility: Secondary | ICD-10-CM | POA: Diagnosis not present

## 2023-11-24 DIAGNOSIS — M6389 Disorders of muscle in diseases classified elsewhere, multiple sites: Secondary | ICD-10-CM | POA: Diagnosis not present

## 2023-11-24 DIAGNOSIS — Z9181 History of falling: Secondary | ICD-10-CM | POA: Diagnosis not present

## 2023-11-24 DIAGNOSIS — R278 Other lack of coordination: Secondary | ICD-10-CM | POA: Diagnosis not present

## 2023-11-24 DIAGNOSIS — R296 Repeated falls: Secondary | ICD-10-CM | POA: Diagnosis not present

## 2023-12-02 DIAGNOSIS — R278 Other lack of coordination: Secondary | ICD-10-CM | POA: Diagnosis not present

## 2023-12-02 DIAGNOSIS — R296 Repeated falls: Secondary | ICD-10-CM | POA: Diagnosis not present

## 2023-12-09 DIAGNOSIS — R278 Other lack of coordination: Secondary | ICD-10-CM | POA: Diagnosis not present

## 2023-12-09 DIAGNOSIS — R296 Repeated falls: Secondary | ICD-10-CM | POA: Diagnosis not present

## 2023-12-16 DIAGNOSIS — R278 Other lack of coordination: Secondary | ICD-10-CM | POA: Diagnosis not present

## 2023-12-16 DIAGNOSIS — R296 Repeated falls: Secondary | ICD-10-CM | POA: Diagnosis not present

## 2023-12-23 DIAGNOSIS — R278 Other lack of coordination: Secondary | ICD-10-CM | POA: Diagnosis not present

## 2023-12-23 DIAGNOSIS — R296 Repeated falls: Secondary | ICD-10-CM | POA: Diagnosis not present

## 2023-12-29 DIAGNOSIS — L84 Corns and callosities: Secondary | ICD-10-CM | POA: Diagnosis not present

## 2023-12-29 DIAGNOSIS — B351 Tinea unguium: Secondary | ICD-10-CM | POA: Diagnosis not present

## 2023-12-29 DIAGNOSIS — E1159 Type 2 diabetes mellitus with other circulatory complications: Secondary | ICD-10-CM | POA: Diagnosis not present

## 2023-12-30 DIAGNOSIS — R296 Repeated falls: Secondary | ICD-10-CM | POA: Diagnosis not present

## 2023-12-30 DIAGNOSIS — R278 Other lack of coordination: Secondary | ICD-10-CM | POA: Diagnosis not present

## 2024-01-06 DIAGNOSIS — R296 Repeated falls: Secondary | ICD-10-CM | POA: Diagnosis not present

## 2024-01-06 DIAGNOSIS — R278 Other lack of coordination: Secondary | ICD-10-CM | POA: Diagnosis not present

## 2024-01-11 ENCOUNTER — Encounter: Payer: Self-pay | Admitting: Internal Medicine

## 2024-01-11 ENCOUNTER — Non-Acute Institutional Stay: Payer: Self-pay | Admitting: Internal Medicine

## 2024-01-11 DIAGNOSIS — N401 Enlarged prostate with lower urinary tract symptoms: Secondary | ICD-10-CM

## 2024-01-11 DIAGNOSIS — R3914 Feeling of incomplete bladder emptying: Secondary | ICD-10-CM | POA: Diagnosis not present

## 2024-01-11 DIAGNOSIS — R410 Disorientation, unspecified: Secondary | ICD-10-CM | POA: Diagnosis not present

## 2024-01-11 DIAGNOSIS — G309 Alzheimer's disease, unspecified: Secondary | ICD-10-CM

## 2024-01-11 DIAGNOSIS — I10 Essential (primary) hypertension: Secondary | ICD-10-CM | POA: Diagnosis not present

## 2024-01-11 DIAGNOSIS — E782 Mixed hyperlipidemia: Secondary | ICD-10-CM

## 2024-01-11 DIAGNOSIS — R531 Weakness: Secondary | ICD-10-CM | POA: Diagnosis not present

## 2024-01-11 NOTE — Progress Notes (Signed)
 [facility] Room Number: 612-A Place of Service:  ALF [phone]) Provider:  Merian Capron  PCP: Mahlon Gammon, MD  Patient Care Team: Mahlon Gammon, MD as PCP - General (Internal Medicine)  Extended Emergency Contact Information Primary Emergency Contact: Cyndi Lennert Address: [address]          [place], [place] [address] D[place] of [place] Home Phone: [phone] Relation: Spouse  Code Status:  DNR Goals of care: Advanced Directive information    01/11/2024    2:37 PM  Advanced Directives  Does Patient Have a Medical Advance Directive? Yes  Type of Advance Directive Living will;Out of facility DNR (pink MOST or yellow form)  Does patient want to make changes to medical advance directive? No - Patient declined     Chief Complaint  Patient presents with   Acute Visit    Weakness and Confusion    HPI:  Pt is a 89 y.o. male seen today for an acute visit for acute onset of confusion  [place] lives in [place] with his wife who has Dementia  Patient is a retired Sports administrator Has a history of hypertension, hyperlipidemia, BPH, urinary incontinence and osteoarthritis  Recently last 2 days patient has been more confused.  Per nurses he was very confused last 2 nights.  He has been saying that he is not in [facility] [place] and [place]  keep moving him.   He has been very paranoid. He has had couple of falls and has been unable to get out of from the floor because of the weakness. I saw the patient in his room.  He said that he knows he is in [facility] but could not recognize the room.  He thinks that he moved here 2 days ago.  He has been here for  more than 6 months. He was not sure why he was moved from his old house to this [place] apartment.  But otherwise patient was alert and was answering all my questions.  He could not tell me his room number.  He did know the president and vice president he knew his date of birth  and his age.   He thought the year was 82.  He said he is working on his taxes. Patient also knew that he has a son who is a rheumatologist and daughter who is  infectious disease But he needed help to get up from his recliner But once up he was able to walk with his walker  Past Medical History:  Diagnosis Date   Arthritis    back and knees   Benign prostatic hyperplasia with incomplete bladder emptying 06/15/2023   Hyperlipidemia 06/15/2023   Mild cognitive impairment 06/15/2023   Primary hypertension 06/15/2023   Primary osteoarthritis of both knees 06/15/2023   Varicose veins    Varicose veins of bilateral lower extremities with other complications 08/03/2013   IMO SNOMED Dx Update Oct 2024     Past Surgical History:  Procedure Laterality Date   INNER EAR SURGERY     SQUAMOUS CELL CARCINOMA EXCISION     on chest    Allergies  Allergen Reactions   Atorvastatin Other (See Comments)    Other Reaction(s): did had some extra aches on this one.    Allergies as of 01/11/2024       Reactions   Atorvastatin Other (See Comments)   Other Reaction(s): did had some extra aches on this one.  Medication List        Accurate as of January 11, 2024  2:38 PM. If you have any questions, ask your nurse or doctor.          acetaminophen 325 MG tablet Commonly known as: TYLENOL Take 650 mg by mouth every 4 (four) hours as needed.   Centrum Silver Chew Chew 1 tablet by mouth in the morning.   diclofenac Sodium 1 % Gel Commonly known as: Voltaren Apply 4 g topically in the morning, at noon, and at bedtime.   ezetimibe 10 MG tablet Commonly known as: ZETIA Take 10 mg by mouth daily.   finasteride 5 MG tablet Commonly known as: PROSCAR Take 5 mg by mouth in the morning.   fluticasone 50 MCG/ACT nasal spray Commonly known as: FLONASE Place 1 spray into both nostrils 2 (two) times daily as needed for allergies or rhinitis.   galantamine 8 MG 24 hr  capsule Commonly known as: RAZADYNE ER Take 8 mg by mouth daily with breakfast.   losartan 25 MG tablet Commonly known as: COZAAR Take 25 mg by mouth every morning.   simvastatin 40 MG tablet Commonly known as: ZOCOR Take 40 mg by mouth every evening.   tamsulosin 0.4 MG Caps capsule Commonly known as: FLOMAX Take by mouth.        Review of Systems  Constitutional:  Negative for activity change, appetite change and unexpected weight change.  HENT: Negative.    Respiratory:  Negative for cough and shortness of breath.   Cardiovascular:  Negative for leg swelling.  Gastrointestinal:  Negative for constipation.  Genitourinary:  Negative for frequency.  Musculoskeletal:  Positive for gait problem. Negative for arthralgias and myalgias.  Skin: Negative.  Negative for rash.  Neurological:  Positive for weakness. Negative for dizziness.  Psychiatric/Behavioral:  Positive for confusion. Negative for sleep disturbance.   All other systems reviewed and are negative.   Immunization History  Administered Date(s) Administered   Fluad Trivalent(High Dose 65+) 09/15/2023   Influenza Split 03/28/2010, 09/24/2010, 12/17/2011, 02/28/2013, 08/15/2013, 08/23/2014, 08/05/2020   Influenza, High Dose Seasonal PF 08/15/2015, 07/29/2016, 08/25/2017   Influenza-Unspecified 08/04/2018, 07/14/2019   Moderna Covid-19 Vaccine Bivalent Booster 22yrs & up 09/15/2023   Moderna SARS-COV2 Booster Vaccination 06/12/2021   PFIZER(Purple Top)SARS-COV-2 Vaccination 08/04/2019, 08/23/2019, 03/13/2020, 09/06/2021   PNEUMOCOCCAL CONJUGATE-20 12/20/2021   Pfizer Covid-19 Vaccine Bivalent Booster 32yrs & up 09/29/2022, 03/19/2023   Pneumococcal Polysaccharide-23 08/18/2007, 03/28/2010, 12/20/2021   Pneumococcal-Unspecified 02/28/2013   Td (Adult),5 Lf Tetanus Toxid, Preservative Free 09/14/2009   Tdap 09/14/2009, 03/28/2010   Zoster Recombinant(Shingrix) 03/10/2018, 06/15/2018   Zoster, Live 03/28/2010,  12/17/2011, 03/10/2018   Pertinent  Health Maintenance Due  Topic Date Due   INFLUENZA VACCINE  Completed      08/18/2023   10:02 AM  Fall Risk  Falls in the past year? 1  Was there an injury with Fall? 1  Fall Risk Category Calculator 3  Patient at Risk for Falls Due to History of fall(s);Impaired balance/gait;Impaired mobility  Fall risk Follow up Falls evaluation completed   Functional Status Survey:    Vitals:   01/11/24 1430  BP: (!) 160/84  Pulse: (!) 58  Resp: 17  Temp: (!) 97 F (36.1 C)  SpO2: 98%  Weight: 199 lb 12.8 oz (90.6 kg)  Height: 5\' 10"  (1.778 m)   Body mass index is 28.67 kg/m. Physical Exam Vitals reviewed.  Constitutional:      Appearance: Normal appearance.  HENT:  Head: Normocephalic.     Nose: Nose normal.     Mouth/Throat:     Mouth: Mucous membranes are moist.     Pharynx: Oropharynx is clear.  Eyes:     Pupils: Pupils are equal, round, and reactive to light.  Cardiovascular:     Rate and Rhythm: Normal rate and regular rhythm.     Pulses: Normal pulses.     Heart sounds: No murmur heard. Pulmonary:     Effort: Pulmonary effort is normal. No respiratory distress.     Breath sounds: Normal breath sounds. No rales.  Abdominal:     General: Abdomen is flat. Bowel sounds are normal.     Palpations: Abdomen is soft.  Musculoskeletal:        General: No swelling.     Cervical back: Neck supple.  Skin:    General: Skin is warm.  Neurological:     General: No focal deficit present.     Mental Status: He is alert and oriented to person, place, and time.     Comments: Was unable to get up from the recliner without help Thought year was 1925 But knew President and [employer] And said he is working on his taxes  Psychiatric:        Mood and Affect: Mood normal.        Thought Content: Thought content normal.     Labs reviewed: Recent Labs    11/19/23 0000  NA 137  K 4.6  CL 103  CO2 25*  BUN 22*  CREATININE 1.0   CALCIUM 9.3   Recent Labs    11/19/23 0000  ALBUMIN 4.3   Recent Labs    11/19/23 0000  WBC 6.6  HGB 14.8  HCT 44  PLT 232   Lab Results  Component Value Date   TSH 3.28 11/19/2023   No results found for: "HGBA1C" Lab Results  Component Value Date   CHOL 116 11/19/2023   HDL 55 11/19/2023   LDLCALC 46 11/19/2023   TRIG 73 11/19/2023    Significant Diagnostic Results in last 30 days:  No results found.  Assessment/Plan 1. Acute confusion (Primary) and Weakness Stat Labs done CBC,CMP All his labs came back WNL with no WBC and Normal Electrolytes Will Order MRI of head Possible Neurology Consult   2. Primary hypertension On Losartan  3. Benign prostatic hyperplasia with incomplete bladder emptying Flomax  4. Mixed hyperlipidemia statin    Family/ staff Communication:   Labs/tests ordered:   Total time spent in this patient care encounter was  45_  minutes; greater than 50% of the visit spent counseling patient and staff, reviewing records , Labs and coordinating care for problems addressed at this encounter.

## 2024-01-12 DIAGNOSIS — L821 Other seborrheic keratosis: Secondary | ICD-10-CM | POA: Diagnosis not present

## 2024-01-12 DIAGNOSIS — D225 Melanocytic nevi of trunk: Secondary | ICD-10-CM | POA: Diagnosis not present

## 2024-01-12 DIAGNOSIS — L814 Other melanin hyperpigmentation: Secondary | ICD-10-CM | POA: Diagnosis not present

## 2024-01-12 DIAGNOSIS — L111 Transient acantholytic dermatosis [Grover]: Secondary | ICD-10-CM | POA: Diagnosis not present

## 2024-01-13 DIAGNOSIS — R278 Other lack of coordination: Secondary | ICD-10-CM | POA: Diagnosis not present

## 2024-01-13 DIAGNOSIS — R296 Repeated falls: Secondary | ICD-10-CM | POA: Diagnosis not present

## 2024-01-14 ENCOUNTER — Ambulatory Visit (HOSPITAL_BASED_OUTPATIENT_CLINIC_OR_DEPARTMENT_OTHER)
Admission: RE | Admit: 2024-01-14 | Discharge: 2024-01-14 | Disposition: A | Discharge status: Home / Self Care | Payer: Medicare Other | Source: Ambulatory Visit | Attending: Internal Medicine | Admitting: Internal Medicine

## 2024-01-14 DIAGNOSIS — F0280 Dementia in other diseases classified elsewhere without behavioral disturbance: Secondary | ICD-10-CM | POA: Insufficient documentation

## 2024-01-14 DIAGNOSIS — I6782 Cerebral ischemia: Secondary | ICD-10-CM | POA: Insufficient documentation

## 2024-01-14 DIAGNOSIS — G309 Alzheimer's disease, unspecified: Secondary | ICD-10-CM | POA: Insufficient documentation

## 2024-01-14 DIAGNOSIS — G319 Degenerative disease of nervous system, unspecified: Secondary | ICD-10-CM | POA: Diagnosis not present

## 2024-01-15 ENCOUNTER — Telehealth: Payer: Self-pay | Admitting: Internal Medicine

## 2024-01-15 DIAGNOSIS — R410 Disorientation, unspecified: Secondary | ICD-10-CM

## 2024-01-15 MED ORDER — MEMANTINE HCL 5 MG PO TABS: 5.0000 mg | ORAL_TABLET | Freq: Two times a day (BID) | ORAL | Status: DC

## 2024-01-15 NOTE — Telephone Encounter (Signed)
 Patient continues to be confused. This is all new for him. All Labs and MRI does not show anything acute. Will Make Neurology Referral for acute decline in Cognition. Also will start him on Namenda 5 mg BID He is already on Galantamine

## 2024-01-19 ENCOUNTER — Ambulatory Visit (INDEPENDENT_AMBULATORY_CARE_PROVIDER_SITE_OTHER): Payer: Medicare Other | Admitting: Diagnostic Neuroimaging

## 2024-01-19 ENCOUNTER — Encounter: Payer: Self-pay | Admitting: Diagnostic Neuroimaging

## 2024-01-19 VITALS — BP 134/62 | HR 55 | Ht 70.0 in | Wt 199.0 lb

## 2024-01-19 DIAGNOSIS — R413 Other amnesia: Secondary | ICD-10-CM

## 2024-01-19 DIAGNOSIS — R278 Other lack of coordination: Secondary | ICD-10-CM | POA: Diagnosis not present

## 2024-01-19 DIAGNOSIS — R296 Repeated falls: Secondary | ICD-10-CM | POA: Diagnosis not present

## 2024-01-19 DIAGNOSIS — R410 Disorientation, unspecified: Secondary | ICD-10-CM | POA: Diagnosis not present

## 2024-01-19 NOTE — Patient Instructions (Signed)
  CONFUSION, MEMORY LOSS (somewhat rapid change in symptoms in Feb 2025; now improved per patient; MMSE 25/30; decline in ADLs; positive frontal release signs; suspect mild-moderate dementia) - check ATN panel - continue memantine and galantamine - continue safety precautions

## 2024-01-19 NOTE — Progress Notes (Signed)
 [facility]  PATIENT: Daniel Moses DOB: Apr 20, 1934  REFERRING CLINICIAN: Mahlon Gammon, MD HISTORY FROM: PATIENT  REASON FOR VISIT: NEW CONSULT   HISTORICAL  CHIEF COMPLAINT:  Chief Complaint  Patient presents with   New Patient (Initial Visit)    Pt alone, rm 6. He has noted that he does have some forgetfulness. He is in assisted living but he is more independent. He is coming in because he is told there has been some decrease in cognition.    Other    Pt states he is able to dress, bathe and feed himself. The [facility] dispenses their medication and he has noticed it has become more challenging looking at finances. his sons have helped    HISTORY OF PRESENT ILLNESS:   89 year old male here for evaluation of rapid cognitive decline and confusion.  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  His wife has significant dementia and also lives there.  Few weeks ago patient had a sudden change in his orientation and confusion, especially at nighttime.  He saw [facility] for evaluation and had lab testing and MRI.  He is being treated with memantine and galantamine.  Today patient arrives to the clinic on his own by transporter.  He feels well.  He denies any significant memory issues.   REVIEW OF SYSTEMS: Full 14 system review of systems performed and negative with exception of: as per HPI.  ALLERGIES: Allergies  Allergen Reactions   Atorvastatin Other (See Comments)    Other Reaction(s): did had some extra aches on this one.    HOME MEDICATIONS: Outpatient Medications Prior to Visit  Medication Sig Dispense Refill   acetaminophen (TYLENOL) 325 MG tablet Take 650 mg by mouth every 4 (four) hours as needed.     diclofenac Sodium (VOLTAREN) 1 % GEL Apply 4 g topically in the morning, at noon, and at bedtime.     ezetimibe (ZETIA) 10 MG tablet Take 10 mg by mouth daily.     finasteride (PROSCAR) 5 MG tablet Take 5 mg by mouth in  the morning.     fluticasone (FLONASE) 50 MCG/ACT nasal spray Place 1 spray into both nostrils 2 (two) times daily as needed for allergies or rhinitis.     galantamine (RAZADYNE ER) 8 MG 24 hr capsule Take 8 mg by mouth daily with breakfast.     losartan (COZAAR) 25 MG tablet Take 25 mg by mouth every morning.     memantine (NAMENDA) 5 MG tablet Take 1 tablet (5 mg total) by mouth 2 (two) times daily.     Multiple Vitamins-Minerals (CENTRUM SILVER) CHEW Chew 1 tablet by mouth in the morning.     simvastatin (ZOCOR) 40 MG tablet Take 40 mg by mouth every evening.     tamsulosin (FLOMAX) 0.4 MG CAPS capsule Take by mouth.     No facility-administered medications prior to visit.    PAST MEDICAL HISTORY: Past Medical History:  Diagnosis Date   Arthritis    back and knees   Benign prostatic hyperplasia with incomplete bladder emptying 06/15/2023   Hyperlipidemia 06/15/2023   Mild cognitive impairment 06/15/2023   Primary hypertension 06/15/2023   Primary osteoarthritis of both knees 06/15/2023   Varicose veins    Varicose veins of bilateral lower extremities with other complications 08/03/2013   IMO SNOMED Dx Update Oct 2024      PAST SURGICAL HISTORY: Past Surgical History:  Procedure Laterality Date   INNER EAR SURGERY  SQUAMOUS CELL CARCINOMA EXCISION     on chest    FAMILY HISTORY: Family History  Problem Relation Age of Onset   Hypertension Mother    Other Mother        varicose veins   Cancer Father     SOCIAL HISTORY: Social History   Socioeconomic History   Marital status: Married    Spouse name: Not on file   Number of children: Not on file   Years of education: Not on file   Highest education level: Not on file  Occupational History   Not on file  Tobacco Use   Smoking status: Some Days    Types: Cigars   Smokeless tobacco: Never  Substance and Sexual Activity   Alcohol use: No   Drug use: No   Sexual activity: Not on file  Other Topics Concern    Not on file  Social History Narrative   Not on file   Social Drivers of Health   Financial Resource Strain: Not on file  Food Insecurity: Not on file  Transportation Needs: Not on file  Physical Activity: Not on file  Stress: Not on file  Social Connections: Not on file  Intimate Partner Violence: Not on file     PHYSICAL EXAM  GENERAL EXAM/CONSTITUTIONAL: Vitals:  Vitals:   01/19/24 1532  BP: 134/62  Pulse: (!) 55  Weight: 199 lb (90.3 kg)  Height: 5\' 10"  (1.778 m)   Body mass index is 28.55 kg/m. Wt Readings from Last 3 Encounters:  01/19/24 199 lb (90.3 kg)  01/11/24 199 lb 12.8 oz (90.6 kg)  11/23/23 200 lb 6.4 oz (90.9 kg)   Patient is in no distress; well developed, nourished and groomed; neck is supple  CARDIOVASCULAR: Examination of carotid arteries is normal; no carotid bruits Regular rate and rhythm, no murmurs Examination of peripheral vascular system by observation and palpation is normal  EYES: Ophthalmoscopic exam of optic discs and posterior segments is normal; no papilledema or hemorrhages No results found.  MUSCULOSKELETAL: Gait, strength, tone, movements noted in Neurologic exam below  NEUROLOGIC: MENTAL STATUS:     01/19/2024    3:38 PM  MMSE - Mini Mental State Exam  Orientation to time 4  Orientation to Place 5  Registration 3  Attention/ Calculation 3  Recall 2  Language- name 2 objects 2  Language- repeat 1  Language- follow 3 step command 3  Language- read & follow direction 1  Write a sentence 1  Copy design 0  Total score 25   awake, alert, oriented to person, place and time recent and remote memory intact normal attention and concentration language fluent, comprehension intact, naming intact fund of knowledge appropriate ANIMAL FLUENCY TESTING 9  CRANIAL NERVE:  2nd - no papilledema on fundoscopic exam 2nd, 3rd, 4th, 6th - pupils equal and reactive to light, visual fields full to confrontation, extraocular  muscles intact, no nystagmus 5th - facial sensation symmetric 7th - facial strength symmetric 8th - hearing intact 9th - palate elevates symmetrically, uvula midline 11th - shoulder shrug symmetric 12th - tongue protrusion midline  MOTOR:  normal bulk and tone, full strength in the BUE, BLE; EXCEPT RIGHT FOOT DF 2/5  SENSORY:  normal and symmetric to light touch, temperature, vibration  COORDINATION:  finger-nose-finger, fine finger movements normal  REFLEXES:  deep tendon reflexes TRACE and symmetric POSITIVE PALMOMENTAL, SNOUT AND GLABELLAR REFLEXES  GAIT/STATION:  narrow based gait; USING WALKER; SIGNIFICANT DIFF GETTING UP FROM CHAIR TO [place]  DIAGNOSTIC DATA (LABS, IMAGING, TESTING) - I reviewed patient records, labs, notes, testing and imaging myself where available.  Lab Results  Component Value Date   WBC 6.6 11/19/2023   HGB 14.8 11/19/2023   HCT 44 11/19/2023   PLT 232 11/19/2023      Component Value Date/Time   NA 137 11/19/2023 0000   K 4.6 11/19/2023 0000   CL 103 11/19/2023 0000   CO2 25 (A) 11/19/2023 0000   BUN 22 (A) 11/19/2023 0000   CREATININE 1.0 11/19/2023 0000   CALCIUM 9.3 11/19/2023 0000   ALBUMIN 4.3 11/19/2023 0000   Lab Results  Component Value Date   CHOL 116 11/19/2023   HDL 55 11/19/2023   LDLCALC 46 11/19/2023   TRIG 73 11/19/2023   No results found for: "HGBA1C" Lab Results  Component Value Date   VITAMINB12 565 11/19/2023   Lab Results  Component Value Date   TSH 3.28 11/19/2023    01/14/24 MRI brain [I reviewed images myself and agree with interpretation. Moderate chronic small vessel ischemic disease and moderate atrophy. -VRP]  1. No evidence of acute abnormality or reversible cause of memory loss. 2. Moderate chronic microvascular ischemic disease. 3.  Cerebral Atrophy (ICD10-G31.9).   ASSESSMENT AND PLAN  89 y.o. year old male here with worsening confusion, memory loss. MMSE 25/30. Some more recent  events of delirium, paranoia.   Dx:  1. Memory loss   2. Confusion     PLAN:  CONFUSION, MEMORY LOSS (somewhat rapid change in symptoms in Feb 2025; now improved per patient; MMSE 25/30; decline in ADLs; positive frontal release signs; suspect mild-moderate dementia) - check ATN panel - continue memantine and galantamine - continue safety precautions  Orders Placed This Encounter  Procedures   ATN PROFILE   Return for pending if symptoms worsen or fail to improve, pending test results.    Suanne Marker, MD 01/19/2024, 4:27 PM Certified in Neurology, Neurophysiology and Neuroimaging  [facility] [address], [address] [place], [place] [address] [phone]

## 2024-01-22 LAB — ATN PROFILE
A -- Beta-amyloid 42/40 Ratio: 0.12 (ref >0.102)
Beta-amyloid 40: 153.33 pg/mL
Beta-amyloid 42: 18.37 pg/mL
N -- NfL, Plasma: 4.46 pg/mL (ref 0.00–11.55)
T -- p-tau181: 1.24 pg/mL — ABNORMAL HIGH (ref 0.00–0.97)

## 2024-01-27 DIAGNOSIS — R296 Repeated falls: Secondary | ICD-10-CM | POA: Diagnosis not present

## 2024-01-27 DIAGNOSIS — R278 Other lack of coordination: Secondary | ICD-10-CM | POA: Diagnosis not present

## 2024-02-03 DIAGNOSIS — R296 Repeated falls: Secondary | ICD-10-CM | POA: Diagnosis not present

## 2024-02-03 DIAGNOSIS — R278 Other lack of coordination: Secondary | ICD-10-CM | POA: Diagnosis not present

## 2024-02-03 NOTE — Progress Notes (Signed)
 Minor pTau abnormality noted, but remainder of profile is good.  No underlying evidence of Alzheimer's associated pathologies. -VRP

## 2024-02-23 ENCOUNTER — Non-Acute Institutional Stay: Payer: No Typology Code available for payment source | Admitting: Internal Medicine

## 2024-02-23 ENCOUNTER — Encounter: Payer: Self-pay | Admitting: Internal Medicine

## 2024-02-23 VITALS — BP 138/78 | HR 56 | Temp 97.9°F | Resp 21 | Ht 70.0 in | Wt 204.8 lb

## 2024-02-23 DIAGNOSIS — E782 Mixed hyperlipidemia: Secondary | ICD-10-CM

## 2024-02-23 DIAGNOSIS — I10 Essential (primary) hypertension: Secondary | ICD-10-CM

## 2024-02-23 DIAGNOSIS — G3184 Mild cognitive impairment, so stated: Secondary | ICD-10-CM

## 2024-02-23 DIAGNOSIS — R3914 Feeling of incomplete bladder emptying: Secondary | ICD-10-CM

## 2024-02-23 DIAGNOSIS — N401 Enlarged prostate with lower urinary tract symptoms: Secondary | ICD-10-CM

## 2024-02-23 NOTE — Progress Notes (Unsigned)
 Location:  [facility] features editor of Service:  Clinic (12)  Provider:   Code Status: *** Goals of Care:     02/23/2024    1:47 PM  Advanced Directives  Does Patient Have a Medical Advance Directive? Yes  Type of Advance Directive Living will;Out of facility DNR (pink MOST or yellow form)  Pre-existing out of facility DNR order (yellow form or pink MOST form) Yellow form placed in chart (order not valid for inpatient use)     Chief Complaint  Patient presents with   Medical Management of Chronic Issues    3 month follow up. Discuss the need for AWV    HPI: Patient is a 89 y.o. male seen today for medical management of chronic diseases.     Past Medical History:  Diagnosis Date   Arthritis    back and knees   Benign prostatic hyperplasia with incomplete bladder emptying 06/15/2023   Hyperlipidemia 06/15/2023   Mild cognitive impairment 06/15/2023   Primary hypertension 06/15/2023   Primary osteoarthritis of both knees 06/15/2023   Varicose veins    Varicose veins of bilateral lower extremities with other complications 08/03/2013   IMO SNOMED Dx Update Oct 2024      Past Surgical History:  Procedure Laterality Date   INNER EAR SURGERY     SQUAMOUS CELL CARCINOMA EXCISION     on chest    Allergies  Allergen Reactions   Atorvastatin Other (See Comments)    Other Reaction(s): did had some extra aches on this one.    Outpatient Encounter Medications as of 02/23/2024  Medication Sig   acetaminophen (TYLENOL) 325 MG tablet Take 650 mg by mouth every 4 (four) hours as needed.   diclofenac Sodium (VOLTAREN) 1 % GEL Apply 4 g topically in the morning, at noon, and at bedtime.   ezetimibe (ZETIA) 10 MG tablet Take 10 mg by mouth daily.   finasteride (PROSCAR) 5 MG tablet Take 5 mg by mouth in the morning.   fluticasone (FLONASE) 50 MCG/ACT nasal spray Place 1 spray into both nostrils 2 (two) times daily as needed for allergies or rhinitis.    galantamine (RAZADYNE ER) 8 MG 24 hr capsule Take 8 mg by mouth daily with breakfast.   losartan (COZAAR) 25 MG tablet Take 25 mg by mouth every morning.   memantine (NAMENDA) 5 MG tablet Take 1 tablet (5 mg total) by mouth 2 (two) times daily.   Multiple Vitamins-Minerals (CENTRUM SILVER) CHEW Chew 1 tablet by mouth in the morning.   simvastatin (ZOCOR) 40 MG tablet Take 40 mg by mouth every evening.   tamsulosin (FLOMAX) 0.4 MG CAPS capsule Take by mouth.   No facility-administered encounter medications on file as of 02/23/2024.    Review of Systems:  Review of Systems  Health Maintenance  Topic Date Due   Medicare Annual Wellness (AWV)  01/06/2024   COVID-19 Vaccine (9 - 2024-25 season) 04/22/2024 (Originally 11/10/2023)   DTaP/Tdap/Td (4 - Td or Tdap) 02/22/2025 (Originally 03/28/2020)   INFLUENZA VACCINE  06/24/2024   Pneumonia Vaccine 1+ Years old  Completed   Zoster Vaccines- Shingrix  Completed   HPV VACCINES  Aged Out    Physical Exam: Vitals:   02/23/24 1135  BP: 138/78  Pulse: (!) 56  Resp: (!) 21  Temp: 97.9 F (36.6 C)  SpO2: 98%  Weight: 204 lb 12.8 oz (92.9 kg)  Height: 5\' 10"  (1.778 m)   Body mass index is 29.39 kg/m. Physical  Exam  Labs reviewed: Basic Metabolic Panel: Recent Labs    11/19/23 0000  NA 137  K 4.6  CL 103  CO2 25*  BUN 22*  CREATININE 1.0  CALCIUM 9.3  TSH 3.28   Liver Function Tests: Recent Labs    11/19/23 0000  ALBUMIN 4.3   No results for input(s): "LIPASE", "AMYLASE" in the last 8760 hours. No results for input(s): "AMMONIA" in the last 8760 hours. CBC: Recent Labs    11/19/23 0000  WBC 6.6  HGB 14.8  HCT 44  PLT 232   Lipid Panel: Recent Labs    11/19/23 0000  CHOL 116  HDL 55  LDLCALC 46  TRIG 73   No results found for: "HGBA1C"  Procedures since last visit: No results found.  Assessment/Plan There are no diagnoses linked to this encounter.   Labs/tests ordered:  * No order type specified  * Next appt:  Visit date not found

## 2024-03-01 DIAGNOSIS — R278 Other lack of coordination: Secondary | ICD-10-CM | POA: Diagnosis not present

## 2024-03-01 DIAGNOSIS — M170 Bilateral primary osteoarthritis of knee: Secondary | ICD-10-CM | POA: Diagnosis not present

## 2024-03-01 DIAGNOSIS — M6281 Muscle weakness (generalized): Secondary | ICD-10-CM | POA: Diagnosis not present

## 2024-03-01 DIAGNOSIS — R269 Unspecified abnormalities of gait and mobility: Secondary | ICD-10-CM | POA: Diagnosis not present

## 2024-03-01 DIAGNOSIS — R296 Repeated falls: Secondary | ICD-10-CM | POA: Diagnosis not present

## 2024-03-03 ENCOUNTER — Non-Acute Institutional Stay: Payer: Self-pay | Admitting: Adult Health

## 2024-03-03 ENCOUNTER — Encounter: Payer: Self-pay | Admitting: Adult Health

## 2024-03-03 DIAGNOSIS — I10 Essential (primary) hypertension: Secondary | ICD-10-CM

## 2024-03-03 DIAGNOSIS — R3914 Feeling of incomplete bladder emptying: Secondary | ICD-10-CM

## 2024-03-03 DIAGNOSIS — N390 Urinary tract infection, site not specified: Secondary | ICD-10-CM | POA: Diagnosis not present

## 2024-03-03 DIAGNOSIS — N3001 Acute cystitis with hematuria: Secondary | ICD-10-CM | POA: Diagnosis not present

## 2024-03-03 DIAGNOSIS — N401 Enlarged prostate with lower urinary tract symptoms: Secondary | ICD-10-CM

## 2024-03-03 LAB — BASIC METABOLIC PANEL WITH GFR
BUN: 14 (ref 4–21)
CO2: 22 (ref 13–22)
Chloride: 103 (ref 99–108)
Creatinine: 0.8 (ref 0.6–1.3)
Glucose: 99
Potassium: 4.4 meq/L (ref 3.5–5.1)
Sodium: 137 (ref 137–147)

## 2024-03-03 LAB — HEPATIC FUNCTION PANEL
ALT: 39 U/L (ref 10–40)
AST: 33 (ref 14–40)
Alkaline Phosphatase: 64 (ref 25–125)
Bilirubin, Total: 0.8

## 2024-03-03 LAB — COMPREHENSIVE METABOLIC PANEL WITH GFR
Albumin: 4 (ref 3.5–5.0)
Calcium: 8.9 (ref 8.7–10.7)
Globulin: 2.2
eGFR: 86

## 2024-03-03 NOTE — Progress Notes (Addendum)
 Location:  Medical illustrator of Service:  ALF (13) Provider:   Peggye Ley, ANP [facility] [phone]   Mahlon Gammon, MD  Patient Care Team: Mahlon Gammon, MD as PCP - General (Internal Medicine)  Extended Emergency Contact Information Primary Emergency Contact: Ojala,BETTY A Address: [address]          [place], [place] [address] [place] of [place] Home Phone: [phone] Relation: Spouse  Code Status:  DNR Goals of care: Advanced Directive information    02/23/2024    1:47 PM  Advanced Directives  Does Patient Have a Medical Advance Directive? Yes  Type of Advance Directive Living will;Out of facility DNR (pink MOST or yellow form)  Pre-existing out of facility DNR order (yellow form or pink MOST form) Yellow form placed in chart (order not valid for inpatient use)     Chief Complaint  Patient presents with   Acute Visit    hematuria    HPI:   The patient is an 89 year old with mild cognitive impairment, BPH, and hypertension who presents with hematuria and recent falls.  He resides in assisted living at [facility]. He has a history of mild cognitive impairment, BPH with incomplete bladder emptying, hyperlipidemia, hypertension, osteoarthritis, and unstable gait. Blood was noted in his urine and in his brief. No burning with urination, fever, bladder pain, back pain, or chills. He is eating and drinking well and is in his usual cognitive state, which is somewhat forgetful. He is followed by a urologist and is due for a follow-up. He sometimes feels he is not emptying his bladder well and occasionally has a weak stream. A post-void residual was measured at 243 mL.  He has experienced several falls recently, including one this morning, but did not hit his head. He has been working with therapy due to these falls.  His initial blood pressure was elevated at 181/94, but improved upon recheck. He is  currently on losartan 25 mg daily and continues this medication. Continued monitoring is necessary.   Past Medical History:  Diagnosis Date   Arthritis    back and knees   Benign prostatic hyperplasia with incomplete bladder emptying 06/15/2023   Hyperlipidemia 06/15/2023   Mild cognitive impairment 06/15/2023   Primary hypertension 06/15/2023   Primary osteoarthritis of both knees 06/15/2023   Varicose veins    Varicose veins of bilateral lower extremities with other complications 08/03/2013   IMO SNOMED Dx Update Oct 2024     Past Surgical History:  Procedure Laterality Date   INNER EAR SURGERY     SQUAMOUS CELL CARCINOMA EXCISION     on chest    Allergies  Allergen Reactions   Atorvastatin Other (See Comments)    Other Reaction(s): did had some extra aches on this one.    Outpatient Encounter Medications as of 03/03/2024  Medication Sig   acetaminophen (TYLENOL) 325 MG tablet Take 650 mg by mouth every 4 (four) hours as needed.   diclofenac Sodium (VOLTAREN) 1 % GEL Apply 4 g topically in the morning, at noon, and at bedtime.   ezetimibe (ZETIA) 10 MG tablet Take 10 mg by mouth daily.   finasteride (PROSCAR) 5 MG tablet Take 5 mg by mouth in the morning.   fluticasone (FLONASE) 50 MCG/ACT nasal spray Place 1 spray into both nostrils 2 (two) times daily as needed for allergies or rhinitis.   galantamine (RAZADYNE ER) 8 MG 24 hr capsule Take 8  mg by mouth daily with breakfast.   losartan (COZAAR) 25 MG tablet Take 25 mg by mouth every morning.   memantine (NAMENDA) 5 MG tablet Take 1 tablet (5 mg total) by mouth 2 (two) times daily.   Multiple Vitamins-Minerals (CENTRUM SILVER) CHEW Chew 1 tablet by mouth in the morning.   simvastatin (ZOCOR) 40 MG tablet Take 40 mg by mouth every evening.   tamsulosin (FLOMAX) 0.4 MG CAPS capsule Take by mouth.   No facility-administered encounter medications on file as of 03/03/2024.    Review of Systems  Constitutional:  Positive for  activity change. Negative for appetite change, chills, diaphoresis, fatigue, fever and unexpected weight change.  Respiratory:  Negative for cough, shortness of breath, wheezing and stridor.   Cardiovascular:  Negative for chest pain, palpitations and leg swelling.  Gastrointestinal:  Negative for abdominal distention, abdominal pain, constipation and diarrhea.  Genitourinary:  Positive for difficulty urinating and hematuria. Negative for decreased urine volume, dysuria, flank pain, frequency, penile swelling, scrotal swelling and urgency.       Weak stream  Musculoskeletal:  Positive for gait problem. Negative for arthralgias, back pain, joint swelling and myalgias.  Neurological:  Negative for dizziness, seizures, syncope, facial asymmetry, speech difficulty, weakness and headaches.  Hematological:  Negative for adenopathy. Does not bruise/bleed easily.  Psychiatric/Behavioral:  Positive for confusion (unchanged). Negative for agitation and behavioral problems.     Immunization History  Administered Date(s) Administered   Fluad Trivalent(High Dose 65+) 09/15/2023   Influenza Split 03/28/2010, 09/24/2010, 12/17/2011, 02/28/2013, 08/15/2013, 08/23/2014, 08/05/2020   Influenza, High Dose Seasonal PF 08/15/2015, 07/29/2016, 08/25/2017   Influenza-Unspecified 08/04/2018, 07/14/2019   Moderna Covid-19 Vaccine Bivalent Booster 76yrs & up 09/15/2023   Moderna SARS-COV2 Booster Vaccination 06/12/2021   PFIZER(Purple Top)SARS-COV-2 Vaccination 08/04/2019, 08/23/2019, 03/13/2020, 09/06/2021   PNEUMOCOCCAL CONJUGATE-20 12/20/2021   Pfizer Covid-19 Vaccine Bivalent Booster 20yrs & up 09/29/2022, 03/19/2023   Pneumococcal Polysaccharide-23 08/18/2007, 03/28/2010, 12/20/2021   Pneumococcal-Unspecified 02/28/2013   Td (Adult),5 Lf Tetanus Toxid, Preservative Free 09/14/2009   Tdap 09/14/2009, 03/28/2010   Zoster Recombinant(Shingrix) 03/10/2018, 06/15/2018   Zoster, Live 03/28/2010, 12/17/2011,  03/10/2018   Pertinent  Health Maintenance Due  Topic Date Due   INFLUENZA VACCINE  06/24/2024      08/18/2023   10:02 AM 02/23/2024    1:46 PM  Fall Risk  Falls in the past year? 1 0  Was there an injury with Fall? 1 0  Fall Risk Category Calculator 3 0  Patient at Risk for Falls Due to History of fall(s);Impaired balance/gait;Impaired mobility Impaired balance/gait;Impaired mobility  Fall risk Follow up Falls evaluation completed Falls evaluation completed   Functional Status Survey:    Vitals:   03/03/24 1510 03/03/24 1613  BP: (!) 181/94 (!) 154/84  Pulse: (!) 57   Resp: 20   Temp: (!) 97.2 F (36.2 C)   SpO2: 97%    There is no height or weight on file to calculate BMI. Physical Exam Vitals and nursing note reviewed.  Constitutional:      Appearance: Normal appearance.  HENT:     Head: Normocephalic and atraumatic.  Eyes:     Conjunctiva/sclera: Conjunctivae normal.     Pupils: Pupils are equal, round, and reactive to light.  Cardiovascular:     Rate and Rhythm: Normal rate and regular rhythm.     Heart sounds: No murmur heard. Pulmonary:     Effort: Pulmonary effort is normal. No respiratory distress.     Breath sounds: Normal breath  sounds. No wheezing.  Abdominal:     General: Bowel sounds are normal. There is no distension.     Palpations: Abdomen is soft.     Tenderness: There is no abdominal tenderness.  Genitourinary:    Penis: Normal.      Comments: Scrotum WNL Blood at the tip of the penis Musculoskeletal:     Cervical back: Normal range of motion. No rigidity.     Right lower leg: No edema.     Left lower leg: No edema.  Lymphadenopathy:     Cervical: No cervical adenopathy.  Skin:    General: Skin is warm and dry.  Neurological:     General: No focal deficit present.     Mental Status: He is alert. Mental status is at baseline.  Psychiatric:        Mood and Affect: Mood normal.     Labs reviewed: Recent Labs    11/19/23 0000  NA  137  K 4.6  CL 103  CO2 25*  BUN 22*  CREATININE 1.0  CALCIUM 9.3   Recent Labs    11/19/23 0000  ALBUMIN 4.3   Recent Labs    11/19/23 0000  WBC 6.6  HGB 14.8  HCT 44  PLT 232   Lab Results  Component Value Date   TSH 3.28 11/19/2023   No results found for: "HGBA1C" Lab Results  Component Value Date   CHOL 116 11/19/2023   HDL 55 11/19/2023   LDLCALC 46 11/19/2023   TRIG 73 11/19/2023    Significant Diagnostic Results in last 30 days:  No results found.  Assessment/Plan Acute cystitis with hematuria and incomplete bladder emptying Acute cystitis with significant urinary retention due to BPH. Urology follow-up needed. - Start Keflex 500 mg TID for 7 days. - Order stat urinalysis with culture and sensitivity and CBC. - Follow up with urology.  Hypertension Blood pressure improved with losartan. - Continue losartan 25 mg daily.  Falls Recent falls due to unstable gait. Therapy ongoing. - Continue therapy for fall prevention.   Total time 30 min:  time greater than 50% of total time spent doing pt counseling and coordination of care

## 2024-03-04 DIAGNOSIS — R310 Gross hematuria: Secondary | ICD-10-CM | POA: Diagnosis not present

## 2024-03-04 DIAGNOSIS — R3914 Feeling of incomplete bladder emptying: Secondary | ICD-10-CM | POA: Diagnosis not present

## 2024-03-04 DIAGNOSIS — N401 Enlarged prostate with lower urinary tract symptoms: Secondary | ICD-10-CM | POA: Diagnosis not present

## 2024-03-08 DIAGNOSIS — M170 Bilateral primary osteoarthritis of knee: Secondary | ICD-10-CM | POA: Diagnosis not present

## 2024-03-08 DIAGNOSIS — R278 Other lack of coordination: Secondary | ICD-10-CM | POA: Diagnosis not present

## 2024-03-08 DIAGNOSIS — R269 Unspecified abnormalities of gait and mobility: Secondary | ICD-10-CM | POA: Diagnosis not present

## 2024-03-08 DIAGNOSIS — R296 Repeated falls: Secondary | ICD-10-CM | POA: Diagnosis not present

## 2024-03-08 DIAGNOSIS — M6281 Muscle weakness (generalized): Secondary | ICD-10-CM | POA: Diagnosis not present

## 2024-03-10 DIAGNOSIS — M6281 Muscle weakness (generalized): Secondary | ICD-10-CM | POA: Diagnosis not present

## 2024-03-10 DIAGNOSIS — R296 Repeated falls: Secondary | ICD-10-CM | POA: Diagnosis not present

## 2024-03-10 DIAGNOSIS — M170 Bilateral primary osteoarthritis of knee: Secondary | ICD-10-CM | POA: Diagnosis not present

## 2024-03-10 DIAGNOSIS — R278 Other lack of coordination: Secondary | ICD-10-CM | POA: Diagnosis not present

## 2024-03-10 DIAGNOSIS — R269 Unspecified abnormalities of gait and mobility: Secondary | ICD-10-CM | POA: Diagnosis not present

## 2024-03-15 DIAGNOSIS — R269 Unspecified abnormalities of gait and mobility: Secondary | ICD-10-CM | POA: Diagnosis not present

## 2024-03-15 DIAGNOSIS — M6281 Muscle weakness (generalized): Secondary | ICD-10-CM | POA: Diagnosis not present

## 2024-03-15 DIAGNOSIS — M170 Bilateral primary osteoarthritis of knee: Secondary | ICD-10-CM | POA: Diagnosis not present

## 2024-03-15 DIAGNOSIS — R278 Other lack of coordination: Secondary | ICD-10-CM | POA: Diagnosis not present

## 2024-03-15 DIAGNOSIS — R296 Repeated falls: Secondary | ICD-10-CM | POA: Diagnosis not present

## 2024-03-16 DIAGNOSIS — R278 Other lack of coordination: Secondary | ICD-10-CM | POA: Diagnosis not present

## 2024-03-16 DIAGNOSIS — R296 Repeated falls: Secondary | ICD-10-CM | POA: Diagnosis not present

## 2024-03-16 DIAGNOSIS — M170 Bilateral primary osteoarthritis of knee: Secondary | ICD-10-CM | POA: Diagnosis not present

## 2024-03-16 DIAGNOSIS — R269 Unspecified abnormalities of gait and mobility: Secondary | ICD-10-CM | POA: Diagnosis not present

## 2024-03-16 DIAGNOSIS — M6281 Muscle weakness (generalized): Secondary | ICD-10-CM | POA: Diagnosis not present

## 2024-03-17 DIAGNOSIS — R310 Gross hematuria: Secondary | ICD-10-CM | POA: Diagnosis not present

## 2024-03-22 DIAGNOSIS — R269 Unspecified abnormalities of gait and mobility: Secondary | ICD-10-CM | POA: Diagnosis not present

## 2024-03-22 DIAGNOSIS — R278 Other lack of coordination: Secondary | ICD-10-CM | POA: Diagnosis not present

## 2024-03-22 DIAGNOSIS — M6281 Muscle weakness (generalized): Secondary | ICD-10-CM | POA: Diagnosis not present

## 2024-03-22 DIAGNOSIS — M170 Bilateral primary osteoarthritis of knee: Secondary | ICD-10-CM | POA: Diagnosis not present

## 2024-03-22 DIAGNOSIS — R296 Repeated falls: Secondary | ICD-10-CM | POA: Diagnosis not present

## 2024-03-24 DIAGNOSIS — M6281 Muscle weakness (generalized): Secondary | ICD-10-CM | POA: Diagnosis not present

## 2024-03-24 DIAGNOSIS — R278 Other lack of coordination: Secondary | ICD-10-CM | POA: Diagnosis not present

## 2024-03-24 DIAGNOSIS — M170 Bilateral primary osteoarthritis of knee: Secondary | ICD-10-CM | POA: Diagnosis not present

## 2024-03-24 DIAGNOSIS — R296 Repeated falls: Secondary | ICD-10-CM | POA: Diagnosis not present

## 2024-03-24 DIAGNOSIS — R269 Unspecified abnormalities of gait and mobility: Secondary | ICD-10-CM | POA: Diagnosis not present

## 2024-03-29 DIAGNOSIS — R269 Unspecified abnormalities of gait and mobility: Secondary | ICD-10-CM | POA: Diagnosis not present

## 2024-03-29 DIAGNOSIS — R296 Repeated falls: Secondary | ICD-10-CM | POA: Diagnosis not present

## 2024-03-29 DIAGNOSIS — M170 Bilateral primary osteoarthritis of knee: Secondary | ICD-10-CM | POA: Diagnosis not present

## 2024-03-29 DIAGNOSIS — M6281 Muscle weakness (generalized): Secondary | ICD-10-CM | POA: Diagnosis not present

## 2024-03-29 DIAGNOSIS — R278 Other lack of coordination: Secondary | ICD-10-CM | POA: Diagnosis not present

## 2024-03-30 DIAGNOSIS — R310 Gross hematuria: Secondary | ICD-10-CM | POA: Diagnosis not present

## 2024-03-31 DIAGNOSIS — M6281 Muscle weakness (generalized): Secondary | ICD-10-CM | POA: Diagnosis not present

## 2024-03-31 DIAGNOSIS — M170 Bilateral primary osteoarthritis of knee: Secondary | ICD-10-CM | POA: Diagnosis not present

## 2024-03-31 DIAGNOSIS — R269 Unspecified abnormalities of gait and mobility: Secondary | ICD-10-CM | POA: Diagnosis not present

## 2024-03-31 DIAGNOSIS — R296 Repeated falls: Secondary | ICD-10-CM | POA: Diagnosis not present

## 2024-03-31 DIAGNOSIS — R278 Other lack of coordination: Secondary | ICD-10-CM | POA: Diagnosis not present

## 2024-04-05 ENCOUNTER — Inpatient Hospital Stay (HOSPITAL_COMMUNITY)

## 2024-04-05 ENCOUNTER — Encounter (HOSPITAL_COMMUNITY): Payer: Self-pay | Admitting: Emergency Medicine

## 2024-04-05 ENCOUNTER — Emergency Department (HOSPITAL_COMMUNITY)

## 2024-04-05 ENCOUNTER — Encounter: Payer: Self-pay | Admitting: Nurse Practitioner

## 2024-04-05 ENCOUNTER — Other Ambulatory Visit: Payer: Self-pay

## 2024-04-05 ENCOUNTER — Inpatient Hospital Stay (HOSPITAL_COMMUNITY)
Admission: EM | Admit: 2024-04-05 | Discharge: 2024-04-08 | DRG: 242 | Disposition: A | Discharge status: Skilled Nursing Facility | Source: Skilled Nursing Facility | Attending: Internal Medicine | Admitting: Internal Medicine

## 2024-04-05 DIAGNOSIS — I4891 Unspecified atrial fibrillation: Secondary | ICD-10-CM

## 2024-04-05 DIAGNOSIS — I5033 Acute on chronic diastolic (congestive) heart failure: Secondary | ICD-10-CM | POA: Diagnosis not present

## 2024-04-05 DIAGNOSIS — I4892 Unspecified atrial flutter: Principal | ICD-10-CM | POA: Diagnosis present

## 2024-04-05 DIAGNOSIS — E785 Hyperlipidemia, unspecified: Secondary | ICD-10-CM | POA: Diagnosis present

## 2024-04-05 DIAGNOSIS — R001 Bradycardia, unspecified: Secondary | ICD-10-CM | POA: Diagnosis not present

## 2024-04-05 DIAGNOSIS — R3914 Feeling of incomplete bladder emptying: Secondary | ICD-10-CM | POA: Diagnosis present

## 2024-04-05 DIAGNOSIS — Z85828 Personal history of other malignant neoplasm of skin: Secondary | ICD-10-CM

## 2024-04-05 DIAGNOSIS — J984 Other disorders of lung: Secondary | ICD-10-CM | POA: Diagnosis not present

## 2024-04-05 DIAGNOSIS — Z7401 Bed confinement status: Secondary | ICD-10-CM | POA: Diagnosis not present

## 2024-04-05 DIAGNOSIS — I2510 Atherosclerotic heart disease of native coronary artery without angina pectoris: Secondary | ICD-10-CM | POA: Diagnosis present

## 2024-04-05 DIAGNOSIS — R911 Solitary pulmonary nodule: Secondary | ICD-10-CM | POA: Diagnosis not present

## 2024-04-05 DIAGNOSIS — R7989 Other specified abnormal findings of blood chemistry: Secondary | ICD-10-CM | POA: Diagnosis not present

## 2024-04-05 DIAGNOSIS — I2489 Other forms of acute ischemic heart disease: Secondary | ICD-10-CM | POA: Diagnosis not present

## 2024-04-05 DIAGNOSIS — R296 Repeated falls: Secondary | ICD-10-CM | POA: Diagnosis present

## 2024-04-05 DIAGNOSIS — I110 Hypertensive heart disease with heart failure: Secondary | ICD-10-CM | POA: Diagnosis not present

## 2024-04-05 DIAGNOSIS — F17290 Nicotine dependence, other tobacco product, uncomplicated: Secondary | ICD-10-CM | POA: Diagnosis present

## 2024-04-05 DIAGNOSIS — I442 Atrioventricular block, complete: Secondary | ICD-10-CM | POA: Diagnosis present

## 2024-04-05 DIAGNOSIS — Z8249 Family history of ischemic heart disease and other diseases of the circulatory system: Secondary | ICD-10-CM | POA: Diagnosis not present

## 2024-04-05 DIAGNOSIS — N401 Enlarged prostate with lower urinary tract symptoms: Secondary | ICD-10-CM | POA: Diagnosis present

## 2024-04-05 DIAGNOSIS — I10 Essential (primary) hypertension: Secondary | ICD-10-CM | POA: Diagnosis present

## 2024-04-05 DIAGNOSIS — Z79899 Other long term (current) drug therapy: Secondary | ICD-10-CM

## 2024-04-05 DIAGNOSIS — D6869 Other thrombophilia: Secondary | ICD-10-CM | POA: Diagnosis present

## 2024-04-05 DIAGNOSIS — F0150 Vascular dementia without behavioral disturbance: Secondary | ICD-10-CM | POA: Diagnosis present

## 2024-04-05 DIAGNOSIS — R590 Localized enlarged lymph nodes: Secondary | ICD-10-CM | POA: Diagnosis not present

## 2024-04-05 DIAGNOSIS — W06XXXA Fall from bed, initial encounter: Secondary | ICD-10-CM | POA: Diagnosis present

## 2024-04-05 DIAGNOSIS — R531 Weakness: Secondary | ICD-10-CM | POA: Diagnosis not present

## 2024-04-05 DIAGNOSIS — R918 Other nonspecific abnormal finding of lung field: Secondary | ICD-10-CM | POA: Diagnosis not present

## 2024-04-05 DIAGNOSIS — I499 Cardiac arrhythmia, unspecified: Secondary | ICD-10-CM | POA: Diagnosis not present

## 2024-04-05 DIAGNOSIS — Z950 Presence of cardiac pacemaker: Secondary | ICD-10-CM | POA: Diagnosis not present

## 2024-04-05 DIAGNOSIS — R2681 Unsteadiness on feet: Secondary | ICD-10-CM | POA: Diagnosis present

## 2024-04-05 LAB — CBC
HCT: 43.5 % (ref 39.0–52.0)
HCT: 43.9 % (ref 39.0–52.0)
Hemoglobin: 14.1 g/dL (ref 13.0–17.0)
Hemoglobin: 14 g/dL (ref 13.0–17.0)
MCH: 31.1 pg (ref 26.0–34.0)
MCH: 30.8 pg (ref 26.0–34.0)
MCHC: 32.4 g/dL (ref 30.0–36.0)
MCHC: 31.9 g/dL (ref 30.0–36.0)
MCV: 96 fL (ref 80.0–100.0)
MCV: 96.7 fL (ref 80.0–100.0)
Platelets: 206 10*3/uL (ref 150–400)
Platelets: 208 10*3/uL (ref 150–400)
RBC: 4.53 MIL/uL (ref 4.22–5.81)
RBC: 4.54 MIL/uL (ref 4.22–5.81)
RDW: 14.3 % (ref 11.5–15.5)
RDW: 14.1 % (ref 11.5–15.5)
WBC: 10.3 10*3/uL (ref 4.0–10.5)
WBC: 9 10*3/uL (ref 4.0–10.5)
nRBC: 0 % (ref 0.0–0.2)
nRBC: 0 % (ref 0.0–0.2)

## 2024-04-05 LAB — BASIC METABOLIC PANEL WITH GFR
Anion gap: 8 (ref 5–15)
BUN: 17 mg/dL (ref 8–23)
CO2: 23 mmol/L (ref 22–32)
Calcium: 9 mg/dL (ref 8.9–10.3)
Chloride: 109 mmol/L (ref 98–111)
Creatinine, Ser: 1.06 mg/dL (ref 0.61–1.24)
GFR, Estimated: >60 mL/min (ref >60)
Glucose, Bld: 127 mg/dL — ABNORMAL HIGH (ref 70–99)
Potassium: 4.3 mmol/L (ref 3.5–5.1)
Sodium: 140 mmol/L (ref 135–145)

## 2024-04-05 LAB — CREATININE, SERUM
Creatinine, Ser: 0.97 mg/dL (ref 0.61–1.24)
GFR, Estimated: >60 mL/min (ref >60)

## 2024-04-05 LAB — ECHOCARDIOGRAM COMPLETE: S' Lateral: 3.6 cm

## 2024-04-05 LAB — TROPONIN I (HIGH SENSITIVITY)
Troponin I (High Sensitivity): 48 ng/L — ABNORMAL HIGH (ref <18)
Troponin I (High Sensitivity): 52 ng/L — ABNORMAL HIGH (ref <18)

## 2024-04-05 LAB — MAGNESIUM: Magnesium: 2.2 mg/dL (ref 1.7–2.4)

## 2024-04-05 LAB — TSH: TSH: 4.478 u[IU]/mL (ref 0.350–4.500)

## 2024-04-05 LAB — BRAIN NATRIURETIC PEPTIDE: B Natriuretic Peptide: 829.1 pg/mL — ABNORMAL HIGH (ref 0.0–100.0)

## 2024-04-05 MED ORDER — TAMSULOSIN HCL 0.4 MG PO CAPS
0.4000 mg | ORAL_CAPSULE | Freq: Every day | ORAL | Status: DC
  Administered 2024-04-05 – 2024-04-08 (×4): 0.4 mg via ORAL
  Filled 2024-04-05 (×4): qty 1

## 2024-04-05 MED ORDER — FINASTERIDE 5 MG PO TABS
5.0000 mg | ORAL_TABLET | Freq: Every morning | ORAL | Status: DC
  Administered 2024-04-06 – 2024-04-07 (×2): 5 mg via ORAL
  Filled 2024-04-05 (×2): qty 1

## 2024-04-05 MED ORDER — ENOXAPARIN SODIUM 40 MG/0.4ML IJ SOSY
40.0000 mg | PREFILLED_SYRINGE | INTRAMUSCULAR | Status: DC
  Administered 2024-04-05 – 2024-04-08 (×4): 40 mg via SUBCUTANEOUS
  Filled 2024-04-05 (×4): qty 0.4

## 2024-04-05 MED ORDER — PERFLUTREN LIPID MICROSPHERE
1.0000 mL | INTRAVENOUS | Status: AC | PRN
  Administered 2024-04-05: 3 mL via INTRAVENOUS

## 2024-04-05 MED ORDER — FLUTICASONE PROPIONATE 50 MCG/ACT NA SUSP: 1.0000 | Freq: Two times a day (BID) | NASAL | Status: DC | PRN

## 2024-04-05 MED ORDER — EZETIMIBE 10 MG PO TABS
10.0000 mg | ORAL_TABLET | Freq: Every day | ORAL | Status: DC
  Administered 2024-04-05 – 2024-04-08 (×4): 10 mg via ORAL
  Filled 2024-04-05 (×5): qty 1

## 2024-04-05 MED ORDER — LOSARTAN POTASSIUM 25 MG PO TABS
25.0000 mg | ORAL_TABLET | Freq: Every morning | ORAL | Status: DC
  Administered 2024-04-05 – 2024-04-08 (×4): 25 mg via ORAL
  Filled 2024-04-05 (×4): qty 1

## 2024-04-05 MED ORDER — FUROSEMIDE 10 MG/ML IJ SOLN
40.0000 mg | Freq: Two times a day (BID) | INTRAMUSCULAR | Status: DC
  Administered 2024-04-05: 40 mg via INTRAVENOUS
  Filled 2024-04-05: qty 4

## 2024-04-05 MED ORDER — SIMVASTATIN 20 MG PO TABS
40.0000 mg | ORAL_TABLET | Freq: Every evening | ORAL | Status: DC
  Administered 2024-04-05 – 2024-04-07 (×3): 40 mg via ORAL
  Filled 2024-04-05 (×3): qty 2

## 2024-04-05 NOTE — ED Notes (Signed)
 [facility] called and updated about admission for pt.  They were asked to update wife who is a resident there as well.

## 2024-04-05 NOTE — ED Notes (Signed)
 Pt transported to echo

## 2024-04-05 NOTE — ED Triage Notes (Signed)
 Per EMS Pt from [facility] assisted living called out for a fall.  Pt states he "slipped out of bed."  No LOC, no thinners and no chest pain.  Pt's heart rate found to be 36, apparently it has been in the low 40's since March.  Pt appears SOB but states he feels fine.    160/100 99% RA 18G L wrist

## 2024-04-05 NOTE — Progress Notes (Unsigned)
 Called for fall, BP 203/94, P 37, R 29, repeated BP 160/92, apical pulse 36. Recommended to ED for further evaluation

## 2024-04-05 NOTE — H&P (Addendum)
 History and Physical    Patient: Daniel Moses RUE:[id number] DOB: [birth year] DOA: 04/05/2024 DOS: the patient was seen and examined on 04/05/2024 PCP: Marguerite Shiley, MD  Patient coming from: Home  Chief Complaint:  Chief Complaint  Patient presents with   Bradycardia   Shortness of Breath   HPI: MONDRE CROME is a 89 y.o. male with medical history significant of mild cognitive impairment, HLD, and BPH p/w GLF and c/f new onset Afib/flutter.  Pt states that he was in his [facility] until this morning when he fell off his bed onto the floor. He denies LOC or head strike, but was unable to get off the floor. Denies tongue biting or incontinence. He "thinks" he rolled off the bed onto the floor around 0800, but can't be certain. He endorses a history of falls in the past years ago, and when probed further notes that he is falling with some regularity at his senior center; in fact, he states that he falls at least 1x/week and most recently fell 3 days ago before this morning. Pt unable to characterize the nature of these falls.  In the ED, pt noted to have irregular heart rhythm on EKG and is admitted to medicine for ongoing care.   Review of Systems: As mentioned in the history of present illness. All other systems reviewed and are negative. Past Medical History:  Diagnosis Date   Arthritis    back and knees   Benign prostatic hyperplasia with incomplete bladder emptying 06/15/2023   Hyperlipidemia 06/15/2023   Mild cognitive impairment 06/15/2023   Primary hypertension 06/15/2023   Primary osteoarthritis of both knees 06/15/2023   Varicose veins    Varicose veins of bilateral lower extremities with other complications 08/03/2013   IMO SNOMED Dx Update Oct 2024     Past Surgical History:  Procedure Laterality Date   INNER EAR SURGERY     SQUAMOUS CELL CARCINOMA EXCISION     on chest   Social History:  reports that he has been smoking cigars. He has never used smokeless tobacco. He  reports that he does not drink alcohol  and does not use drugs.  Allergies  Allergen Reactions   Atorvastatin Other (See Comments)    Other Reaction(s): did had some extra aches on this one.    Family History  Problem Relation Age of Onset   Hypertension Mother    Other Mother        varicose veins   Cancer Father     Prior to Admission medications   Medication Sig Start Date End Date Taking? Authorizing Provider  acetaminophen (TYLENOL) 325 MG tablet Take 650 mg by mouth every 4 (four) hours as needed.    [provider]  diclofenac  Sodium (VOLTAREN ) 1 % GEL Apply 4 g topically in the morning, at noon, and at bedtime. 11/23/23   Wert, Christina, NP  ezetimibe (ZETIA) 10 MG tablet Take 10 mg by mouth daily.    [provider]  finasteride (PROSCAR) 5 MG tablet Take 5 mg by mouth in the morning.    [provider]  fluticasone (FLONASE) 50 MCG/ACT nasal spray Place 1 spray into both nostrils 2 (two) times daily as needed for allergies or rhinitis.    [provider]  galantamine (RAZADYNE ER) 8 MG 24 hr capsule Take 8 mg by mouth daily with breakfast.    [provider]  losartan (COZAAR) 25 MG tablet Take 25 mg by mouth every morning.    [provider]  memantine  (NAMENDA ) 5 MG tablet Take 1 tablet (5 mg total) by mouth 2 (two) times daily. 01/15/24   Marguerite Shiley, MD  Multiple Vitamins-Minerals (CENTRUM SILVER) CHEW Chew 1 tablet by mouth in the morning.    [provider]  simvastatin (ZOCOR) 40 MG tablet Take 40 mg by mouth every evening.    [provider]  tamsulosin (FLOMAX) 0.4 MG CAPS capsule Take 0.4 mg by mouth daily.    [provider]    Physical Exam: Vitals:   04/05/24 0830 04/05/24 0900 04/05/24 0915 04/05/24 0930  BP:  (!) 186/138  (!) 172/93  Pulse: (!) 42 (!) 36 (!) 32 (!) 51  Resp: (!) 24 11 16  (!) 22  Temp:      SpO2: 100% 98% 100% 100%  Weight:      Height:       General:  Alert, oriented x3, resting comfortably in no acute distress Respiratory: Bibasilar rales; no wheezing Cardiovascular: Irregular rhythm; no murmurs Abdomen: Soft, nontender, nondistended. Positive bowel sounds tric: Appropriate mood and affect, conversational and cooperative  Data Reviewed:  Lab Results  Component Value Date   WBC 10.3 04/05/2024   HGB 14.1 04/05/2024   HCT 43.5 04/05/2024   MCV 96.0 04/05/2024   PLT 206 04/05/2024   Lab Results  Component Value Date   GLUCOSE 127 (H) 04/05/2024   CALCIUM 9.0 04/05/2024   NA 140 04/05/2024   K 4.3 04/05/2024   CO2 23 04/05/2024   CL 109 04/05/2024   BUN 17 04/05/2024   CREATININE 1.06 04/05/2024   No results found for: "ALT", "AST", "GGT", "ALKPHOS", "BILITOT" No results found for: "INR", "PROTIME"  Radiology: CT CHEST WO CONTRAST Result Date: 04/05/2024 CLINICAL DATA:  Lung nodule EXAM: CT CHEST WITHOUT CONTRAST TECHNIQUE: Multidetector CT imaging of the chest was performed following the standard protocol without IV contrast. RADIATION DOSE REDUCTION: This exam was performed according to the departmental dose-optimization program which includes automated exposure control, adjustment of the mA and/or kV according to patient size and/or use of iterative reconstruction technique. COMPARISON:  CT abdomen and pelvis Mar 30, 2024 FINDINGS: Cardiovascular: No significant vascular findings. Normal heart size. No pericardial effusion. Extensive coronary artery calcifications Mediastinum/Nodes: Large nodular adenopathy along the prevascular aortic space measuring 15 x 20 mm, retrocaval pretracheal adenopathy measuring 11 x 15 mm. Lungs/Pleura: Prominence of the interstitial markings bilaterally could correlate with chronic interstitial lung disease with mild centrilobular emphysema and some atelectatic changes along the right and left mid lung and left lingular region. No obvious pulmonary nodules. Upper Abdomen: Questionable low-attenuation  area within the right lobe of the liver on image number 57 correlates with the previously seen lesions on the prior CT of the abdomen refer to prior dictation. Musculoskeletal: No chest wall mass or suspicious bone lesions identified. IMPRESSION: *Prominence of the interstitial markings bilaterally could correlate with chronic interstitial lung disease with mild centrilobular emphysema and some atelectatic changes along the right and left mid lung and left lingular region. *Large nodular adenopathy along the prevascular aortic space measuring 15 x 20 mm, retrocaval pretracheal adenopathy measuring 11 x 15 mm. *Questionable low-attenuation area within the right lobe of the liver on image number 57 correlates with the previously seen lesions on the prior CT of the abdomen refer to prior dictation. Electronically Signed   By: Fredrich Jefferson M.D.   On: 04/05/2024 07:59   DG Chest Portable 1 View Result Date: 04/05/2024 CLINICAL DATA:  Bradycardia.  EXAM: PORTABLE CHEST 1 VIEW COMPARISON:  None Available. FINDINGS: Low volume film. The cardio pericardial silhouette is enlarged. Streaky density at the left base suggest atelectasis. Two view study. Circular density identified in the right apex, potentially related to the anterior first rib end. No acute bony abnormality. Telemetry leads overlie the chest. IMPRESSION: 1. Low volume film with streaky density at the left base suggesting atelectasis. 2. Circular density in the right apex, potentially related to the anterior first rib end. CT chest without contrast recommended to exclude pulmonary parenchymal lesion. Electronically Signed   By: Donnal Fusi M.D.   On: 04/05/2024 06:24     Assessment and Plan: 59M h/o mild cognitive impairment, HLD, and BPH p/w GLF and c/f new onset Afib/flutter c/b ADHF.  Afib/flutter HFpEF exacerbation Possible SSS Elevated BNP Demand ischemia Admitted after GLF. Tele c/w Afib/flutter. Elevated BNP c/w ADHF. Troponin  elevated. -Cards following; apprec eval/recs -HOLD BB given c/f SSS and bradycardia while resting -IV lasix 40mg  BID for now; goal net neg 1-2L/d; strict I/Os; daily standing weights; K>4/Mg>2 -F/u TTE  HTN -PTA losartan 25mg  daily  HLD -PTA simvastatin 40mg  daily  BPH -PTA finasteride and flomax  GLF Physical decondioning -PT consulted; apprec eval/recs   Advance Care Planning:   Code Status: Full Code   Consults: Cardiology  Family Communication: N/A  Severity of Illness: The appropriate patient status for this patient is INPATIENT. Inpatient status is judged to be reasonable and necessary in order to provide the required intensity of service to ensure the patient's safety. The patient's presenting symptoms, physical exam findings, and initial radiographic and laboratory data in the context of their chronic comorbidities is felt to place them at high risk for further clinical deterioration. Furthermore, it is not anticipated that the patient will be medically stable for discharge from the hospital within 2 midnights of admission.   * I certify that at the point of admission it is my clinical judgment that the patient will require inpatient hospital care spanning beyond 2 midnights from the point of admission due to high intensity of service, high risk for further deterioration and high frequency of surveillance required.*   ------- I spent 55 minutes reviewing previous labs/notes, obtaining separate history at the bedside, counseling/discussing the treatment plan outlined above, ordering medications/tests, and performing clinical documentation.  Author: Arne Langdon, MD 04/05/2024 12:00 PM  For on call review [email or web address].

## 2024-04-05 NOTE — ED Provider Notes (Signed)
 [name] EMER[facility]ENC[facility] DEPARTMENT AT [facility] Provider Note   CSN: [id number] Arrival date & time: 04/05/24  0534     History  Chief Complaint  Patient presents with   Bradycardia   Shortness of Breath    Daniel Moses is a 89 y.o. male.  HPI     This is an 89 year old male who presents with bradycardia.  Patient was initially evaluated after fall.  Patient reports that he slid out of bed.  He did not hit his head or lose consciousness.  He is not on any blood thinners.  After being helped up patient's heart rate was found to be in the 30s.  He has had heart rates that have been lower over the recent months in the 40s.  Denies chest pain, shortness of breath.  Denies any new symptoms.  Does state that his legs are slightly more swollen than normal because he did not wear his compression hose today's.  Home Medications Prior to Admission medications   Medication Sig Start Date End Date Taking? Authorizing Provider  acetaminophen (TYLENOL) 325 MG tablet Take 650 mg by mouth every 4 (four) hours as needed.    [provider]  diclofenac  Sodium (VOLTAREN ) 1 % GEL Apply 4 g topically in the morning, at noon, and at bedtime. 11/23/23   Wert, Christina, NP  ezetimibe (ZETIA) 10 MG tablet Take 10 mg by mouth daily.    [provider]  finasteride (PROSCAR) 5 MG tablet Take 5 mg by mouth in the morning.    [provider]  fluticasone (FLONASE) 50 MCG/ACT nasal spray Place 1 spray into both nostrils 2 (two) times daily as needed for allergies or rhinitis.    [provider]  galantamine (RAZADYNE ER) 8 MG 24 hr capsule Take 8 mg by mouth daily with breakfast.    [provider]  losartan (COZAAR) 25 MG tablet Take 25 mg by mouth every morning.    [provider]  memantine  (NAMENDA ) 5 MG tablet Take 1 tablet (5 mg total) by mouth 2 (two) times daily. 01/15/24   Marguerite Shiley, MD  Multiple Vitamins-Minerals (CENTRUM SILVER)  CHEW Chew 1 tablet by mouth in the morning.    [provider]  simvastatin (ZOCOR) 40 MG tablet Take 40 mg by mouth every evening.    [provider]  tamsulosin (FLOMAX) 0.4 MG CAPS capsule Take by mouth.    [provider]      Allergies    Atorvastatin    Review of Systems   Review of Systems  Constitutional:  Negative for fever.  Respiratory:  Negative for shortness of breath.   Cardiovascular:  Positive for leg swelling. Negative for chest pain.  All other systems reviewed and are negative.   Physical Exam Updated Vital Signs BP (!) 215/89   Pulse (!) 40   Temp 97.6 F (36.4 C)   Resp 18   Ht 1.753 m (5\' 9" )   Wt 90.7 kg   SpO2 100%   BMI 29.53 kg/m  Physical Exam Vitals and nursing note reviewed.  Constitutional:      Appearance: He is well-developed.  HENT:     Head: Normocephalic and atraumatic.  Eyes:     Pupils: Pupils are equal, round, and reactive to light.  Cardiovascular:     Rate and Rhythm: Regular rhythm. Bradycardia present.     Heart sounds: Normal heart sounds. No murmur heard. Pulmonary:     Effort: Pulmonary  effort is normal. No respiratory distress.     Breath sounds: Normal breath sounds. No decreased breath sounds or wheezing.  Abdominal:     General: Bowel sounds are normal.     Palpations: Abdomen is soft.     Tenderness: There is no abdominal tenderness. There is no rebound.  Musculoskeletal:     Cervical back: Neck supple.     Right lower leg: Edema present.     Left lower leg: Edema present.     Comments: Trace to 1+ bilateral lower extremity edema  Lymphadenopathy:     Cervical: No cervical adenopathy.  Skin:    General: Skin is warm and dry.  Neurological:     Mental Status: He is alert and oriented to person, place, and time.  Psychiatric:        Mood and Affect: Mood normal.     ED Results / Procedures / Treatments   Labs (all labs ordered are listed, but only abnormal results are  displayed) Labs Reviewed  CBC  BASIC METABOLIC PANEL WITH GFR  MAGNESIUM  BRAIN NATRIURETIC PEPTIDE  TROPONIN I (HIGH SENSITIVITY)    EKG EKG Interpretation Date/Time:  Tuesday Apr 05 2024 05:41:07 EDT Ventricular Rate:  42 PR Interval:    QRS Duration:  101 QT Interval:  513 QTC Calculation: 429 R Axis:   99  Text Interpretation: Atrial flutter Anterior infarct, old Borderline T abnormalities, inferior leads ST elevation, consider inferior injury Confirmed by Donita Furrow ([id number]) on 04/05/2024 5:45:42 AM  Radiology No results found.  Procedures Procedures    Medications Ordered in ED Medications - No data to display  ED Course/ Medical Decision Making/ A&P                                 Medical Decision Making Amount and/or Complexity of Data Reviewed Labs: ordered. Radiology: ordered.  Risk Decision regarding hospitalization.   This patient presents to the ED for concern of bradycardia, this involves an extensive number of treatment options, and is a complaint that carries with it a high risk of complications and morbidity.  I considered the following differential and admission for this acute, potentially life threatening condition.  The differential diagnosis includes heart block, arrhythmia, ACS, CHF  MDM:    This is a an 89 year old male who presents with asymptomatic bradycardia.  Was incidentally noted to be bradycardic after being picked up from a fall.  Regarding his fall he denies any injury.  He is awake alert.  Did not hit his head.  EKG appears to be slow atrial fibrillation flutter.  Not on any AV nodal blockade.  This is new for him.  Atrial fib order set used.  No significant metabolic derangements.  Chest x-ray does have a questionable density recommending CT chest.  Patient remains asymptomatic.  At times he does drop his heart rate into the 30s.  Will plan for admission and cardiology evaluation.  Signed out to oncoming provider.  (Labs,  imaging, consults)  Labs: I Ordered, and personally interpreted labs.  The pertinent results include: atrial fibrillation order set  Imaging Studies ordered: I ordered imaging studies including chest x-ray, CT imaging I independently visualized and interpreted imaging. I agree with the radiologist interpretation  Additional history obtained from chart review.  External records from outside source obtained and reviewed including prior evaluations  Cardiac Monitoring: The patient was maintained on a cardiac monitor.  If on  the cardiac monitor, I personally viewed and interpreted the cardiac monitored which showed an underlying rhythm of: Slow atrial flutter  Reevaluation: After the interventions noted above, I reevaluated the patient and found that they have :stayed the same  Social Determinants of Health:  lives in assisted living  Disposition: Admit  Co morbidities that complicate the patient evaluation  Past Medical History:  Diagnosis Date   Arthritis    back and knees   Benign prostatic hyperplasia with incomplete bladder emptying 06/15/2023   Hyperlipidemia 06/15/2023   Mild cognitive impairment 06/15/2023   Primary hypertension 06/15/2023   Primary osteoarthritis of both knees 06/15/2023   Varicose veins    Varicose veins of bilateral lower extremities with other complications 08/03/2013   IMO SNOMED Dx Update Oct 2024       Medicines Meds ordered this encounter  Medications   enoxaparin (LOVENOX) injection 40 mg   DISCONTD: furosemide (LASIX) injection 40 mg   ezetimibe (ZETIA) tablet 10 mg   finasteride (PROSCAR) tablet 5 mg   fluticasone (FLONASE) 50 MCG/ACT nasal spray 1 spray   losartan (COZAAR) tablet 25 mg   simvastatin (ZOCOR) tablet 40 mg   tamsulosin (FLOMAX) capsule 0.4 mg   perflutren lipid microspheres (DEFINITY) IV suspension    I have reviewed the patients home medicines and have made adjustments as needed  Problem List / ED Course: Problem  List Items Addressed This Visit   None Visit Diagnoses       Bradycardia    -  Primary                   Final Clinical Impression(s) / ED Diagnoses Final diagnoses:  Bradycardia    Rx / DC Orders ED Discharge Orders          Ordered    Amb referral to AFIB Clinic        04/05/24 0544              Rory Collard, MD 04/05/24 2320

## 2024-04-05 NOTE — Progress Notes (Signed)
 Echocardiogram 2D Echocardiogram has been performed.  Daniel Moses RDCS 04/05/2024, 3:21 PM

## 2024-04-05 NOTE — ED Notes (Signed)
 HR fluctuates from mid-30's to mid-50's but drops into high 20's when sleeping

## 2024-04-05 NOTE — ED Notes (Signed)
 PT had a incontinent episode, bed and gown were changed. PT was able to roll with some difficulty.

## 2024-04-05 NOTE — Consult Note (Addendum)
 Cardiology Consultation   Patient ID: Daniel Moses MRN: [id number]; DOB: [birth year]  Admit date: 04/05/2024 Date of Consult: 04/05/2024  PCP:  Marguerite Shiley, MD   [facility] Providers Cardiologist:  None        Patient Profile:   Daniel Moses is a 89 y.o. male with a hx of BPH, arthritis, hyperlipidemia, progressive dementia, hypertension who is being seen 04/05/2024 for the evaluation of bradycardia at the request of Dr. Carylon Claude.  History of Present Illness:   [name] presented from SNF early this morning via EMS after a presumed fall. Per notes, appears that facility provider evaluated the patient after the fall and noted hypertension with bradycardia, pulse rate 36. Given this bradycardia, EMS activated.  Upon evaluation in the emergency department, patient is unable to describe the circumstances of his fall this morning.  He reports waking up on the floor and needing assistance in getting back up.  Discussed patient's baseline activity, resides at [facility] facility with his spouse.  Patient reports several falls in recent timeframe, attributes to forgetting to use his walker for ambulation assistance. Per EDP note, patient has apparently been noted with HR in the 40s in recent months. When asked about this, patient indicates that he does recall being advised of slow HR. Review of recent outpatient notes shows heart rate in the 50s.  Patient reports increased fatigue and recent months though attributes to caregiver burden related to his spouse and her declining cognitive function.  He denies dizziness, lightheadedness, shortness of breath.   Past Medical History:  Diagnosis Date   Arthritis    back and knees   Benign prostatic hyperplasia with incomplete bladder emptying 06/15/2023   Hyperlipidemia 06/15/2023   Mild cognitive impairment 06/15/2023   Primary hypertension 06/15/2023   Primary osteoarthritis of both knees 06/15/2023   Varicose veins     Varicose veins of bilateral lower extremities with other complications 08/03/2013   IMO SNOMED Dx Update Oct 2024      Past Surgical History:  Procedure Laterality Date   INNER EAR SURGERY     SQUAMOUS CELL CARCINOMA EXCISION     on chest     Home Medications:  Prior to Admission medications   Medication Sig Start Date End Date Taking? Authorizing Provider  acetaminophen (TYLENOL) 325 MG tablet Take 650 mg by mouth 2 (two) times daily.   Yes [provider]  ezetimibe (ZETIA) 10 MG tablet Take 10 mg by mouth daily.   Yes [provider]  finasteride (PROSCAR) 5 MG tablet Take 5 mg by mouth in the morning.   Yes [provider]  fluticasone (FLONASE) 50 MCG/ACT nasal spray Place 1 spray into both nostrils 2 (two) times daily as needed for allergies or rhinitis.   Yes [provider]  galantamine (RAZADYNE ER) 8 MG 24 hr capsule Take 8 mg by mouth daily with breakfast.   Yes [provider]  losartan (COZAAR) 25 MG tablet Take 25 mg by mouth every morning.   Yes [provider]  memantine  (NAMENDA ) 5 MG tablet Take 1 tablet (5 mg total) by mouth 2 (two) times daily. Patient taking differently: Take 5 mg by mouth daily. 01/15/24  Yes Marguerite Shiley, MD  Multiple Vitamins-Minerals (CENTRUM SILVER) CHEW Chew 1 tablet by mouth in the morning.   Yes [provider]  simvastatin (ZOCOR) 40 MG tablet Take 40 mg by mouth every evening.   Yes [provider]  tamsulosin (FLOMAX) 0.4 MG CAPS capsule Take 0.4 mg by mouth daily.   Yes [provider]  diclofenac  Sodium (VOLTAREN ) 1 % GEL Apply 4 g topically in the morning, at noon, and at bedtime. 11/23/23   Raylene Calamity, NP    Inpatient Medications: Scheduled Meds:  enoxaparin (LOVENOX) injection  40 mg Subcutaneous Q24H   ezetimibe  10 mg Oral Daily   [START ON 04/06/2024] finasteride  5 mg Oral q AM   furosemide  40 mg Intravenous Q12H   losartan  25 mg Oral q  morning   simvastatin  40 mg Oral QPM   tamsulosin  0.4 mg Oral Daily   Continuous Infusions:  PRN Meds: fluticasone  Allergies:    Allergies  Allergen Reactions   Atorvastatin Other (See Comments)    Other Reaction(s): did had some extra aches on this one.    Social History:   Social History   Socioeconomic History   Marital status: Married    Spouse name: Not on file   Number of children: Not on file   Years of education: Not on file   Highest education level: Not on file  Occupational History   Not on file  Tobacco Use   Smoking status: Some Days    Types: Cigars   Smokeless tobacco: Never  Substance and Sexual Activity   Alcohol  use: No   Drug use: No   Sexual activity: Not on file  Other Topics Concern   Not on file  Social History Narrative   Not on file   Social Drivers of Health   Financial Resource Strain: Not on file  Food Insecurity: Not on file  Transportation Needs: Not on file  Physical Activity: Not on file  Stress: Not on file  Social Connections: Not on file  Intimate Partner Violence: Not on file    Family History:    Family History  Problem Relation Age of Onset   Hypertension Mother    Other Mother        varicose veins   Cancer Father      ROS:  Please see the history of present illness.   All other ROS reviewed and negative.     Physical Exam/Data:   Vitals:   04/05/24 0930 04/05/24 1030 04/05/24 1130 04/05/24 1200  BP: (!) 172/93 (!) 179/104 (!) 154/84 (!) 166/76  Pulse: (!) 51 (!) 52 (!) 39 (!) 36  Resp: (!) 22 20 16 15   Temp:      SpO2: 100% 100% 100% 100%  Weight:      Height:       No intake or output data in the 24 hours ending 04/05/24 1208    04/05/2024    5:43 AM 02/23/2024   11:35 AM 01/19/2024    3:32 PM  Last 3 Weights  Weight (lbs) 200 lb 204 lb 12.8 oz 199 lb  Weight (kg) 90.719 kg 92.897 kg 90.266 kg     Body mass index is 29.53 kg/m.  General:  Well nourished, well developed, in no acute  distress HEENT: normal Neck: no JVD Vascular: No carotid bruits; Distal pulses 2+ bilaterally Cardiac:  normal S1, S2; irregular and slow; no murmur  Lungs:  clear to auscultation bilaterally, no wheezing, rhonchi or rales  Abd: soft, nontender, no hepatomegaly  Ext: no edema Musculoskeletal:  No deformities, BUE and BLE strength normal and equal Skin: warm and dry  Neuro:  CNs 2-12 intact, no focal abnormalities noted Psych:  Normal affect  EKG:  The EKG was personally reviewed and demonstrates:  atrial flutter with slow ventricular response, HR 42 Telemetry:  Telemetry was personally reviewed and demonstrates:  atrial flutter with ventricular rates between upper 30s and mid 50s.   Relevant CV Studies: Echo pending  Laboratory Data:  High Sensitivity Troponin:   Recent Labs  Lab 04/05/24 0549  TROPONINIHS 48*     Chemistry Recent Labs  Lab 04/05/24 0549  NA 140  K 4.3  CL 109  CO2 23  GLUCOSE 127*  BUN 17  CREATININE 1.06  CALCIUM 9.0  MG 2.2  GFRNONAA >60  ANIONGAP 8    No results for input(s): "PROT", "ALBUMIN", "AST", "ALT", "ALKPHOS", "BILITOT" in the last 168 hours. Lipids No results for input(s): "CHOL", "TRIG", "HDL", "LABVLDL", "LDLCALC", "CHOLHDL" in the last 168 hours.  Hematology Recent Labs  Lab 04/05/24 0549  WBC 10.3  RBC 4.53  HGB 14.1  HCT 43.5  MCV 96.0  MCH 31.1  MCHC 32.4  RDW 14.3  PLT 206   Thyroid  No results for input(s): "TSH", "FREET4" in the last 168 hours.  BNP Recent Labs  Lab 04/05/24 0550  BNP 829.1*    DDimer No results for input(s): "DDIMER" in the last 168 hours.   Radiology/Studies:  CT CHEST WO CONTRAST Result Date: 04/05/2024 CLINICAL DATA:  Lung nodule EXAM: CT CHEST WITHOUT CONTRAST TECHNIQUE: Multidetector CT imaging of the chest was performed following the standard protocol without IV contrast. RADIATION DOSE REDUCTION: This exam was performed according to the departmental dose-optimization program which  includes automated exposure control, adjustment of the mA and/or kV according to patient size and/or use of iterative reconstruction technique. COMPARISON:  CT abdomen and pelvis Mar 30, 2024 FINDINGS: Cardiovascular: No significant vascular findings. Normal heart size. No pericardial effusion. Extensive coronary artery calcifications Mediastinum/Nodes: Large nodular adenopathy along the prevascular aortic space measuring 15 x 20 mm, retrocaval pretracheal adenopathy measuring 11 x 15 mm. Lungs/Pleura: Prominence of the interstitial markings bilaterally could correlate with chronic interstitial lung disease with mild centrilobular emphysema and some atelectatic changes along the right and left mid lung and left lingular region. No obvious pulmonary nodules. Upper Abdomen: Questionable low-attenuation area within the right lobe of the liver on image number 57 correlates with the previously seen lesions on the prior CT of the abdomen refer to prior dictation. Musculoskeletal: No chest wall mass or suspicious bone lesions identified. IMPRESSION: *Prominence of the interstitial markings bilaterally could correlate with chronic interstitial lung disease with mild centrilobular emphysema and some atelectatic changes along the right and left mid lung and left lingular region. *Large nodular adenopathy along the prevascular aortic space measuring 15 x 20 mm, retrocaval pretracheal adenopathy measuring 11 x 15 mm. *Questionable low-attenuation area within the right lobe of the liver on image number 57 correlates with the previously seen lesions on the prior CT of the abdomen refer to prior dictation. Electronically Signed   By: Fredrich Jefferson M.D.   On: 04/05/2024 07:59   DG Chest Portable 1 View Result Date: 04/05/2024 CLINICAL DATA:  Bradycardia. EXAM: PORTABLE CHEST 1 VIEW COMPARISON:  None Available. FINDINGS: Low volume film. The cardio pericardial silhouette is enlarged. Streaky density at the left base suggest  atelectasis. Two view study. Circular density identified in the right apex, potentially related to the anterior first rib end. No acute bony abnormality. Telemetry leads overlie the chest. IMPRESSION: 1. Low volume film with streaky density at the left base suggesting atelectasis. 2. Circular density in  the right apex, potentially related to the anterior first rib end. CT chest without contrast recommended to exclude pulmonary parenchymal lesion. Electronically Signed   By: Donnal Fusi M.D.   On: 04/05/2024 06:24     Assessment and Plan:   Atrial flutter with variable conduction (new onset) Slow ventricular response Patient presented with EMS from assisted living facility today after waking up on the floor this morning and needing assistance to get back into bed.  Appears that upon evaluation by facility staff, patient bradycardic with heart rate in the 30s prompting activation of EMS.  In the emergency department today, patient's EKG and telemetry shows persistent atrial flutter with variable conduction, slow ventricular response.  Patient seemingly asymptomatic at rest with slow heart rate.  On exam patient did have increase of heart rate into the 50s with exertion to set up for lung auscultation.  Patient not on any rate reducing medications at baseline. Difficult situation with patient seemingly experiencing cognitive decline in the setting of vascular dementia over the past several months.  On exam today has appropriate affect with moderate recollection of recent events, though not of the circumstances that led to his being on the floor today.  With his progressive dementia as well as recent falls, I feel that patient is overall a poor candidate for consideration of pacemaker.  Would worry about a fall resulting in lead dislodgment.  Can discuss with EP but suspect that they will agree that patient not a candidate for pacemaker at this time. Fortunately, there does appear to be some chronotropic  compensation, with HR increasing to the upper 50s with exertion to sit up in bed. Although new onset atrial flutter with secondary hypercoagulable state, given apparent increased frequency of falls, would worry about intracranial hemorrhage if patient were to start taking DOAC. Without DOAC, would not attempt restoration of NSR. Check TSH  Elevated BNP On admission, patient found with BNP 829.1.  Despite this, chest x-ray, chest CT and physical exam do not reveal significant pulmonary edema.  Patient also without significant lower extremity edema either. Elevated BNP may reflect a degree of atrial dilation secondary to atrial flutter of unknown duration.  IV diuretics have been ordered by patient's admitting provider, would use caution with further doses. An echocardiogram has been ordered.  This will be helpful in determining both cardiac function as well as volume status.  Hypertension Patient taking low-dose losartan, 25 mg daily per home med list.  In the setting of this admission to the hospital, hypertensive while in the emergency. Okay to continue home losartan 25 mg.  Will need to balance blood pressure management with low resting heart rate.  Troponin elevation Initial troponin in the ED 48. Repeat value pending. Patient without any chest pain or shortness of breath.  Suspect demand ischemia in the setting of hypotension and bradycardia. Trend troponin to peak  Hyperlipidemia Continue home statin.  Risk Assessment/Risk Scores:          CHA2DS2-VASc Score = 3   This indicates a 3.2% annual risk of stroke. The patient's score is based upon: CHF History: 0 HTN History: 1 Diabetes History: 0 Stroke History: 0 Vascular Disease History: 0 Age Score: 2 Gender Score: 0         For questions or updates, please contact [facility] Please consult [email or web address] for contact info under    Signed, Leala Prince, PA-C  04/05/2024 12:08 PM   ATTENDING  ATTESTATION:  After conducting a review of all available  clinical information with the care team, interviewing the patient, and performing a physical exam, I agree with the findings and plan described in this note.   GEN: No acute distress.   HEENT:  MMM, no JVD, no scleral icterus Cardiac: Bradycardic, no murmurs, rubs, or gallops.  Respiratory: Clear to auscultation bilaterally. GI: Soft, nontender, non-distended  MS: No edema; No deformity. Neuro:  Nonfocal  Vasc:  +2 radial pulses  Agree with with above.  Patient is an 89 year old male who lives at a SNF and has had multiple falls in the past.  He tells me that his heart rate has been slow for a very long time.  He was found on the floor with an incidentally noted heart rate of 30s to 40s.  He tells me he slipped out of bed and is unclear as to how this happened.  Given his unsteady gait and frequent falls he is not a candidate for anticoagulation and I agree he is probably not a candidate for a permanent pacemaker.  His heart rates now are in the 40s to 50s which sounds like his baseline.  No further evaluation is necessary.  He looks to be euvolemic on exam so would hold on further Lasix.  Follow-up echocardiogram.   Alyssa Backbone, MD Pager [phone]

## 2024-04-05 NOTE — ED Provider Notes (Signed)
   Patient seen after prior EDP.   Patient understands plan of care.  Message left with patient's son Dr. Freddi Jaeger at [phone].  No callback yet.  Cardiology is aware of case and will consult.  Hospitalist service is aware of case and will evaluate for admission.   Burnette Carte, MD 04/05/24 [phone]

## 2024-04-06 LAB — SURGICAL PCR SCREEN
MRSA, PCR: NEGATIVE
Staphylococcus aureus: NEGATIVE

## 2024-04-06 MED ORDER — CEFAZOLIN SODIUM-DEXTROSE 2-4 GM/100ML-% IV SOLN
2.0000 g | INTRAVENOUS | Status: AC
  Administered 2024-04-07: 2 g via INTRAVENOUS
  Filled 2024-04-06: qty 100

## 2024-04-06 MED ORDER — MUPIROCIN 2 % EX OINT
1.0000 | TOPICAL_OINTMENT | Freq: Two times a day (BID) | CUTANEOUS | Status: DC
  Administered 2024-04-06 – 2024-04-08 (×4): 1 via NASAL
  Filled 2024-04-06: qty 22

## 2024-04-06 MED ORDER — SODIUM CHLORIDE 0.9% FLUSH: 3.0000 mL | INTRAVENOUS | Status: DC | PRN

## 2024-04-06 MED ORDER — CHLORHEXIDINE GLUCONATE 4 % EX SOLN
60.0000 mL | Freq: Once | CUTANEOUS | Status: AC
  Administered 2024-04-06: 4 via TOPICAL
  Filled 2024-04-06: qty 60

## 2024-04-06 MED ORDER — CHLORHEXIDINE GLUCONATE 4 % EX SOLN
60.0000 mL | Freq: Once | CUTANEOUS | Status: AC
  Filled 2024-04-06: qty 15

## 2024-04-06 MED ORDER — SODIUM CHLORIDE 0.9 % IV SOLN: 250.0000 mL | INTRAVENOUS | Status: DC

## 2024-04-06 MED ORDER — SODIUM CHLORIDE 0.9 % IV SOLN
80.0000 mg | INTRAVENOUS | Status: AC
  Administered 2024-04-07: 80 mg
  Filled 2024-04-06: qty 2

## 2024-04-06 MED ORDER — SODIUM CHLORIDE 0.9% FLUSH
3.0000 mL | Freq: Two times a day (BID) | INTRAVENOUS | Status: DC
  Administered 2024-04-06: 3 mL via INTRAVENOUS

## 2024-04-06 MED ORDER — SODIUM CHLORIDE 0.9% FLUSH
3.0000 mL | Freq: Two times a day (BID) | INTRAVENOUS | Status: DC
  Administered 2024-04-06 – 2024-04-08 (×4): 3 mL via INTRAVENOUS

## 2024-04-06 NOTE — Evaluation (Signed)
 Physical Therapy Evaluation Patient Details Name: OZRO KLEPACKI MRN: [id number] DOB: June 12, 1934 Today's Date: 04/06/2024  History of Present Illness  89 y.o. male presents to [facility] 04/05/24 from ALF for evaluation of bradycardia after falling out of his bed with inability to stand. Pt with new onset a-flutter with variable AV conduction, bradycardia, and demand ischemia. PMHx: mild cognitive impairment, HLD, and BPH p/w GLF and c/f new onset Afib/flutter.   Clinical Impression  Pt supine on stretcher upon arrival and agreeable to PT eval. PTA, pt required assist for all ADLs and mobility with use of RW. Pt reports multiple falls with approximately x1 fall a week due to being "unsteady". In today's session, pt required MaxA to move from supine/sit. Pt was able to sit on EOB for 15 minutes while PA was in the room with MinA/CGA needed for slight posterior lean. Unable to complete transfer due to pt needing to be in supine for further evaluation. He was able to laterally scoot on the EOB with CGA for safety. Pt currently with functional limitations due to the deficits listed below (see PT Problem List). Pt would benefit from acute skilled PT to address functional impairments. Recommending post-acute rehab <3hrs to prevent future falls and to work towards independence with mobility. Acute PT to follow.  HR 37-70 BPM during session         If plan is discharge home, recommend the following: A little help with bathing/dressing/bathroom;A lot of help with walking and/or transfers;Assist for transportation;Help with stairs or ramp for entrance;Assistance with cooking/housework   Can travel by private vehicle    TBA    Equipment Recommendations None recommended by PT  Recommendations for Other Services  OT consult    Functional Status Assessment Patient has had a recent decline in their functional status and demonstrates the ability to make significant improvements in function in a reasonable and  predictable amount of time.     Precautions / Restrictions Precautions Precautions: Fall Precaution/Restrictions Comments: watch HR Restrictions Weight Bearing Restrictions Per Provider Order: No      Mobility  Bed Mobility Overal bed mobility: Needs Assistance Bed Mobility: Supine to Sit, Sit to Supine     Supine to sit: Max assist, HOB elevated Sit to supine: Contact guard assist   General bed mobility comments: MaxA to complete moving LE's off EOB and to raise trunk. CGA for return to supine.    Transfers  General transfer comment: Able to laterally scoot on recliner with CGA/MinA for balance. Deferred transfer as PA joined and needed pt in supine for further assessment.       Balance Overall balance assessment: Needs assistance, Mild deficits observed, not formally tested, History of Falls Sitting-balance support: Bilateral upper extremity supported, Feet supported Sitting balance-Leahy Scale: Fair Sitting balance - Comments: MinA to CGA for seated balance due to occasional posterior lean Postural control: Posterior lean       Pertinent Vitals/Pain Pain Assessment Pain Assessment: No/denies pain    Home Living Family/patient expects to be discharged to:: Assisted living  Home Equipment: Agricultural consultant (2 wheels);Rollator (4 wheels)      Prior Function Prior Level of Function : Needs assist      Mobility Comments: reported needing assist for bed mobility, transfer, and gait. Uses RW or rollator for mobility. Reports at least x1 fall a week due to "being unsteady" ADLs Comments: assist for all ADLs     Extremity/Trunk Assessment   Upper Extremity Assessment Upper Extremity Assessment: Defer to OT  evaluation    Lower Extremity Assessment Lower Extremity Assessment: Generalized weakness    Cervical / Trunk Assessment Cervical / Trunk Assessment: Kyphotic  Communication   Communication Communication: No apparent difficulties    Cognition Arousal:  Alert Behavior During Therapy: WFL for tasks assessed/performed   PT - Cognitive impairments: No family/caregiver present to determine baseline, History of cognitive impairments    Following commands: Intact       Cueing Cueing Techniques: Verbal cues      PT Assessment Patient needs continued PT services  PT Problem List Decreased strength;Decreased activity tolerance;Decreased balance;Decreased mobility       PT Treatment Interventions DME instruction;Gait training;Functional mobility training;Therapeutic activities;Therapeutic exercise;Neuromuscular re-education;Balance training;Patient/family education    PT Goals (Current goals can be found in the Care Plan section)  Acute Rehab PT Goals Patient Stated Goal: to get better PT Goal Formulation: With patient Time For Goal Achievement: 04/20/24 Potential to Achieve Goals: Good    Frequency Min 2X/week        AM-PAC PT "6 Clicks" Mobility  Outcome Measure Help needed turning from your back to your side while in a flat bed without using bedrails?: A Lot Help needed moving from lying on your back to sitting on the side of a flat bed without using bedrails?: A Lot Help needed moving to and from a bed to a chair (including a wheelchair)?: A Lot Help needed standing up from a chair using your arms (e.g., wheelchair or bedside chair)?: A Lot Help needed to walk in hospital room?: A Lot Help needed climbing 3-5 steps with a railing? : A Lot 6 Click Score: 12    End of Session Equipment Utilized During Treatment: Gait belt Activity Tolerance: Patient tolerated treatment well Patient left: in bed;with call bell/phone within reach;Other (comment) (PA) Nurse Communication: Mobility status PT Visit Diagnosis: Unsteadiness on feet (R26.81);Repeated falls (R29.6);Muscle weakness (generalized) (M62.81)    Time: 6213-0865 PT Time Calculation (min) (ACUTE ONLY): 23 min   Charges:   PT Evaluation $PT Eval Low Complexity: 1 Low    PT General Charges $$ ACUTE PT VISIT: 1 Visit        Orysia Blas, PT, DPT Secure Chat Preferred  Rehab Office [phone]   Alissa April Adela Ades 04/06/2024, 9:46 AM

## 2024-04-06 NOTE — Progress Notes (Signed)
 PROGRESS NOTE  Daniel Moses GNF:[id number] DOB: 05/29/1934 DOA: 04/05/2024 PCP: Daniel Shiley, MD   LOS: 1 day   Brief narrative:  Daniel Moses is a 89 y.o. male with past medical history significant of mild cognitive impairment, hyperal anemia, and BPH presented to hospital after sustaining a fall on the floor without any loss of consciousness.  Has had falls in the past as well.  In the ED patient was noted to have irregular rhythm and EKG and was subsequently admitted to hospital for further evaluation and treatment.   Assessment/Plan: Principal Problem:   Flutter-fibrillation (HCC) Active Problems:   Hyperlipidemia   Primary hypertension   Unstable gait  New onset Afib/flutter with bradycardia, variable AV conduction. Elevated BNP Demand ischemia Continue telemetry monitoring.  New onset of atrial fibrillation flutter with bradycardia..  CHA2DS2-VASc score 4.  Poor anticoagulation candidate as per cardiology due to falls and progressive dementia.  Cardiology has been consulted.  Continue to hold beta-blocker.  Continue IV Lasix twice daily.  Check 2D echocardiogram.  Patient euvolemic at this time.  Likely dyspnea from bradycardia.  Electrophysiology Cardiology evaluation pending.  Essential hypertension Continue losartan, blood pressure seems to be elevated.   Hyperlipidemia Continue statins   BPH Continue finasteride and Flomax.  Generalized weakness,Physical decondioning, unstable gait Physical therapy  recommended skilled nursing facility placement on discharge.  Consult TOC   History of vascular dementia. Currently at the skilled nursing facility.  Continue supportive care.  Delirium precautions.  DVT prophylaxis: enoxaparin (LOVENOX) injection 40 mg Start: 04/05/24 1200   Disposition: Skilled thin facility as per PT evaluation.  Status is: Inpatient Remains inpatient appropriate because: A flutter with bradycardia, need for rehabilitation, EP cardiology  evaluation pending, pending clinical improvement.    Code Status:     Code Status: Full Code  Family Communication: None at bedside  Consultants: Cardiology  Procedures: None  Anti-infectives:  None  Anti-infectives (From admission, onward)    None        Subjective: Today, patient was seen and examined at bedside.  Patient denies any chest pain, dizziness, lightheadedness or shortness of breath at this time.  Denies any chest pain.  Objective: Vitals:   04/05/24 2315 04/06/24 1112  BP: (!) 159/81 (!) 150/102  Pulse: (!) 52 (!) 33  Resp: 19 13  Temp:  (!) 97.5 F (36.4 C)  SpO2: 100% 95%   No intake or output data in the 24 hours ending 04/06/24 1321 Filed Weights   04/05/24 0543  Weight: 90.7 kg   Body mass index is 29.53 kg/m.   Physical Exam: GENERAL: Patient is alert awake and oriented. Not in obvious distress.  Elderly male, Communicative, HENT: No scleral pallor or icterus. Pupils equally reactive to light. Oral mucosa is moist NECK: is supple, no gross swelling noted. CHEST: Clear to auscultation. No crackles or wheezes.   CVS: S1 and S2 heard, no murmur.  Irregular rhythm with bradycardia. ABDOMEN: Soft, non-tender, bowel sounds are present. EXTREMITIES: No edema. CNS: Cranial nerves are intact. No focal motor deficits. SKIN: warm and dry without rashes.  Data Review: I have personally reviewed the following laboratory data and studies,  CBC: Recent Labs  Lab 04/05/24 0549 04/05/24 1300  WBC 10.3 9.0  HGB 14.1 14.0  HCT 43.5 43.9  MCV 96.0 96.7  PLT 206 208   Basic Metabolic Panel: Recent Labs  Lab 04/05/24 0549 04/05/24 1300  NA 140  --   K 4.3  --  CL 109  --   CO2 23  --   GLUCOSE 127*  --   BUN 17  --   CREATININE 1.06 0.97  CALCIUM 9.0  --   MG 2.2  --    Liver Function Tests: No results for input(s): "AST", "ALT", "ALKPHOS", "BILITOT", "PROT", "ALBUMIN" in the last 168 hours. No results for input(s): "LIPASE",  "AMYLASE" in the last 168 hours. No results for input(s): "AMMONIA" in the last 168 hours. Cardiac Enzymes: No results for input(s): "CKTOTAL", "CKMB", "CKMBINDEX", "TROPONINI" in the last 168 hours. BNP (last 3 results) Recent Labs    04/05/24 0550  BNP 829.1*    ProBNP (last 3 results) No results for input(s): "PROBNP" in the last 8760 hours.  CBG: No results for input(s): "GLUCAP" in the last 168 hours. No results found for this or any previous visit (from the past 240 hours).   Studies: ECHOCARDIOGRAM COMPLETE Result Date: 04/05/2024    ECHOCARDIOGRAM REPORT   Patient Name:   Daniel Moses Date of Exam: 04/05/2024 Medical Rec #:  [id number]    Height:       69.0 in Accession #:    [id number]   Weight:       200.0 lb Date of Birth:  28-Oct-1934   BSA:          2.066 m Patient Age:    89 years     BP:           168/80 mmHg Patient Gender: M            HR:           40 bpm. Exam Location:  Inpatient Procedure: 2D Echo, Color Doppler, Cardiac Doppler and Intracardiac            Opacification Agent (Both Spectral and Color Flow Doppler were            utilized during procedure). Indications:    Atrial fibrillation and Flutter  History:        Patient has no prior history of Echocardiogram examinations.                 Risk Factors:Hypertension and Dyslipidemia.  Sonographer:    Sherline Distel Senior RDCS Referring Phys: [id number] Daniel Moses  Sonographer Comments: Very poor apical windows due to patient body habitus. IMPRESSIONS  1. Left ventricular ejection fraction, by estimation, is 50 to 55%. The left ventricle has low normal function. Left ventricular endocardial border not optimally defined to evaluate regional wall motion. There is mild concentric left ventricular hypertrophy. Left ventricular diastolic parameters are indeterminate.  2. Right ventricular systolic function is normal. The right ventricular size is mildly enlarged.  3. Left atrial size was severely dilated.  4. Right atrial size was  severely dilated.  5. The mitral valve is normal in structure. Mild mitral valve regurgitation. No evidence of mitral stenosis.  6. Multiple jets. Tricuspid valve regurgitation is severe.  7. The aortic valve is tricuspid. There is moderate calcification of the aortic valve. Aortic valve regurgitation is not visualized. Aortic valve sclerosis is present, with no evidence of aortic valve stenosis.  8. The inferior vena cava is dilated in size with <50% respiratory variability, suggesting right atrial pressure of 15 mmHg. FINDINGS  Left Ventricle: No Strain or 3D Transmitted. Left ventricular ejection fraction, by estimation, is 50 to 55%. The left ventricle has low normal function. Left ventricular endocardial border not optimally defined to evaluate regional wall motion. Definity contrast agent was  given IV to delineate the left ventricular endocardial borders. Strain was performed and the global longitudinal strain is indeterminate. The left ventricular internal cavity size was normal in size. There is mild concentric left ventricular hypertrophy. Left ventricular diastolic parameters are indeterminate. Right Ventricle: The right ventricular size is mildly enlarged. No increase in right ventricular wall thickness. Right ventricular systolic function is normal. Left Atrium: Left atrial size was severely dilated. Right Atrium: Right atrial size was severely dilated. Pericardium: There is no evidence of pericardial effusion. Mitral Valve: The mitral valve is normal in structure. Mild mitral valve regurgitation. No evidence of mitral valve stenosis. Tricuspid Valve: Multiple jets. The tricuspid valve is normal in structure. Tricuspid valve regurgitation is severe. Aortic Valve: The aortic valve is tricuspid. There is moderate calcification of the aortic valve. Aortic valve regurgitation is not visualized. Aortic valve sclerosis is present, with no evidence of aortic valve stenosis. Pulmonic Valve: The pulmonic valve  was not well visualized. Pulmonic valve regurgitation is not visualized. No evidence of pulmonic stenosis. Aorta: The aortic root and ascending aorta are structurally normal, with no evidence of dilitation. Venous: The inferior vena cava is dilated in size with less than 50% respiratory variability, suggesting right atrial pressure of 15 mmHg. IAS/Shunts: No atrial level shunt detected by color flow Doppler. Additional Comments: 3D was performed not requiring image post processing on an independent workstation and was indeterminate.  LEFT VENTRICLE PLAX 2D LVIDd:         5.00 cm LVIDs:         3.60 cm LV PW:         1.10 cm LV IVS:        1.00 cm LVOT diam:     2.20 cm LV SV:         71 LV SV Index:   35 LVOT Area:     3.80 cm  RIGHT VENTRICLE RV S prime:     10.20 cm/s TAPSE (M-mode): 1.8 cm LEFT ATRIUM              Index        RIGHT ATRIUM           Index LA diam:        4.70 cm  2.27 cm/m   RA Area:     34.80 cm LA Vol (A2C):   115.0 ml 55.66 ml/m  RA Volume:   129.00 ml 62.44 ml/m LA Vol (A4C):   99.2 ml  48.02 ml/m LA Biplane Vol: 110.0 ml 53.24 ml/m  AORTIC VALVE LVOT Vmax:   88.60 cm/s LVOT Vmean:  61.200 cm/s LVOT VTI:    0.188 m  AORTA Ao Root diam: 3.20 cm Ao Asc diam:  3.50 cm TRICUSPID VALVE TR Peak grad:   40.7 mmHg TR Vmax:        319.00 cm/s  SHUNTS Systemic VTI:  0.19 m Systemic Diam: 2.20 cm Gloriann Larger MD Electronically signed by Gloriann Larger MD Signature Date/Time: 04/05/2024/6:11:16 PM    Final    CT CHEST WO CONTRAST Result Date: 04/05/2024 CLINICAL DATA:  Lung nodule EXAM: CT CHEST WITHOUT CONTRAST TECHNIQUE: Multidetector CT imaging of the chest was performed following the standard protocol without IV contrast. RADIATION DOSE REDUCTION: This exam was performed according to the departmental dose-optimization program which includes automated exposure control, adjustment of the mA and/or kV according to patient size and/or use of iterative reconstruction technique.  COMPARISON:  CT abdomen and pelvis Mar 30, 2024 FINDINGS: Cardiovascular: No  significant vascular findings. Normal heart size. No pericardial effusion. Extensive coronary artery calcifications Mediastinum/Nodes: Large nodular adenopathy along the prevascular aortic space measuring 15 x 20 mm, retrocaval pretracheal adenopathy measuring 11 x 15 mm. Lungs/Pleura: Prominence of the interstitial markings bilaterally could correlate with chronic interstitial lung disease with mild centrilobular emphysema and some atelectatic changes along the right and left mid lung and left lingular region. No obvious pulmonary nodules. Upper Abdomen: Questionable low-attenuation area within the right lobe of the liver on image number 57 correlates with the previously seen lesions on the prior CT of the abdomen refer to prior dictation. Musculoskeletal: No chest wall mass or suspicious bone lesions identified. IMPRESSION: *Prominence of the interstitial markings bilaterally could correlate with chronic interstitial lung disease with mild centrilobular emphysema and some atelectatic changes along the right and left mid lung and left lingular region. *Large nodular adenopathy along the prevascular aortic space measuring 15 x 20 mm, retrocaval pretracheal adenopathy measuring 11 x 15 mm. *Questionable low-attenuation area within the right lobe of the liver on image number 57 correlates with the previously seen lesions on the prior CT of the abdomen refer to prior dictation. Electronically Signed   By: Fredrich Jefferson M.D.   On: 04/05/2024 07:59   DG Chest Portable 1 View Result Date: 04/05/2024 CLINICAL DATA:  Bradycardia. EXAM: PORTABLE CHEST 1 VIEW COMPARISON:  None Available. FINDINGS: Low volume film. The cardio pericardial silhouette is enlarged. Streaky density at the left base suggest atelectasis. Two view study. Circular density identified in the right apex, potentially related to the anterior first rib end. No acute bony  abnormality. Telemetry leads overlie the chest. IMPRESSION: 1. Low volume film with streaky density at the left base suggesting atelectasis. 2. Circular density in the right apex, potentially related to the anterior first rib end. CT chest without contrast recommended to exclude pulmonary parenchymal lesion. Electronically Signed   By: Donnal Fusi M.D.   On: 04/05/2024 06:24      Rosena Conradi, MD  [facility] 04/06/2024  If 7PM-7AM, please contact night-coverage

## 2024-04-06 NOTE — Plan of Care (Signed)

## 2024-04-06 NOTE — ED Notes (Signed)
 Pt and family updated on plan of care and results based on H&P, Cardiac and PT notes.

## 2024-04-06 NOTE — Consult Note (Addendum)
 Cardiology Consultation   Patient ID: Daniel Moses MRN: [id number]; DOB: 1934-10-17  Admit date: 04/05/2024 Date of Consult: 04/06/2024  PCP:  Marguerite Shiley, MD    [facility] Providers Cardiologist:  Kyra Phy, MD   {(new)    Patient Profile:   Daniel Moses is a 89 y.o. male with a hx of  BPH, arthritis, hyperlipidemia, progressive dementia, hypertension   who is being seen 04/06/2024 for the evaluation of bradycardia at the request of Dr. Lorie Rook.  History of Present Illness:   [name] resides at an ALF (with his wife), day of admission had a fall, when they were assessing him, HR noted 30's and EMS was called Pt was unable to give specifics of the fall on arrival, "woke up on the floor), reported of late with several falls Seems some hx of known bradycardia 40's was noted, pt was unaware of that He was hypertensive in the ER  Cardiology consulted Found slow AFlutter not previously known for him. Not felt an A/C candidate 2/2 falls advanced age His HR did increase with exertion in bed to the 50's, was not convinced that his overall clinical status and this he would be a PPM candidate HRs felt to back at what has been his baseline   Today  Pt reports that he fell because he didn't use his walker, and was feeling well AFlutter rates reported 30's-40's higher with exertion (in bed) to 50's Planned to avoid nodal blocking agents though on today's assessment cognition improved and have asked EP to weigh in on PPM/bradycardia  LABS K+ 4.3 BUN/Creat 17/1.06 > 0.97 BNP 829 HS Trop 48 > 52 WBC 9.0 H/H 14/43 Plts 208 TSH 4.478  Home meds No traditional nodal blocking agents Galantamine appears to be able to cause bradycardia/AV block > though is for his ALzhemier's and would be reluctant to  stop  The patient's daughter is bedside Unclear mechanisms of his falls, he is a poor historian. He denies any symptoms of any kind His daughter mentions he  has bad knees and this and advanced age may contribute Unknown if any of the falls have been syncope or near syncope   Past Medical History:  Diagnosis Date   Arthritis    back and knees   Benign prostatic hyperplasia with incomplete bladder emptying 06/15/2023   Hyperlipidemia 06/15/2023   Mild cognitive impairment 06/15/2023   Primary hypertension 06/15/2023   Primary osteoarthritis of both knees 06/15/2023   Varicose veins    Varicose veins of bilateral lower extremities with other complications 08/03/2013   IMO SNOMED Dx Update Oct 2024      Past Surgical History:  Procedure Laterality Date   INNER EAR SURGERY     SQUAMOUS CELL CARCINOMA EXCISION     on chest     Home Medications:  Prior to Admission medications   Medication Sig Start Date End Date Taking? Authorizing Provider  acetaminophen (TYLENOL) 500 MG tablet Take 1,000 mg by mouth 2 (two) times daily. May take one additional 1000mg  dose as needed for pain   Yes [provider]  diclofenac  Sodium (VOLTAREN ) 1 % GEL Apply 4 g topically in the morning, at noon, and at bedtime. 11/23/23  Yes Wert, Craige Dixon, NP  ezetimibe (ZETIA) 10 MG tablet Take 10 mg by mouth daily.   Yes [provider]  finasteride (PROSCAR) 5 MG tablet Take 5 mg by mouth in the morning.   Yes [provider]  fluticasone (  FLONASE) 50 MCG/ACT nasal spray Place 1 spray into both nostrils 2 (two) times daily as needed for allergies or rhinitis.   Yes [provider]  galantamine (RAZADYNE ER) 8 MG 24 hr capsule Take 8 mg by mouth daily with breakfast.   Yes [provider]  losartan (COZAAR) 25 MG tablet Take 25 mg by mouth every morning.   Yes [provider]  memantine  (NAMENDA ) 5 MG tablet Take 1 tablet (5 mg total) by mouth 2 (two) times daily. 01/15/24  Yes Marguerite Shiley, MD  Multiple Vitamins-Minerals (CENTRUM SILVER) CHEW Chew 1 tablet by mouth in the morning.   Yes [provider]   simvastatin (ZOCOR) 40 MG tablet Take 40 mg by mouth every evening.   Yes [provider]  tamsulosin (FLOMAX) 0.4 MG CAPS capsule Take 0.4 mg by mouth daily.   Yes [provider]    Inpatient Medications: Scheduled Meds:  enoxaparin (LOVENOX) injection  40 mg Subcutaneous Q24H   ezetimibe  10 mg Oral Daily   finasteride  5 mg Oral q AM   losartan  25 mg Oral q morning   simvastatin  40 mg Oral QPM   tamsulosin  0.4 mg Oral Daily   Continuous Infusions:  PRN Meds: fluticasone  Allergies:    Allergies  Allergen Reactions   Atorvastatin Other (See Comments)    Myalgia    Social History:   Social History   Socioeconomic History   Marital status: Married    Spouse name: Not on file   Number of children: Not on file   Years of education: Not on file   Highest education level: Not on file  Occupational History   Not on file  Tobacco Use   Smoking status: Some Days    Types: Cigars   Smokeless tobacco: Never  Substance and Sexual Activity   Alcohol  use: No   Drug use: No   Sexual activity: Not on file  Other Topics Concern   Not on file  Social History Narrative   Not on file   Social Drivers of Health   Financial Resource Strain: Not on file  Food Insecurity: Not on file  Transportation Needs: Not on file  Physical Activity: Not on file  Stress: Not on file  Social Connections: Not on file  Intimate Partner Violence: Not on file    Family History:   Family History  Problem Relation Age of Onset   Hypertension Mother    Other Mother        varicose veins   Cancer Father      ROS:  Please see the history of present illness.  All other ROS reviewed and negative.     Physical Exam/Data:   Vitals:   04/05/24 2315 04/06/24 1112 04/06/24 1545 04/06/24 1559  BP: (!) 159/81 (!) 150/102 135/85   Pulse: (!) 52 (!) 33 (!) 49 (!) 39  Resp: 19 13 (!) 22 19  Temp:  (!) 97.5 F (36.4 C) (!) 97.5 F (36.4 C) 97.9 F (36.6 C)  TempSrc:   Oral Oral Oral  SpO2: 100% 95% 98% 95%  Weight:      Height:       No intake or output data in the 24 hours ending 04/06/24 1636    04/05/2024    5:43 AM 02/23/2024   11:35 AM 01/19/2024    3:32 PM  Last 3 Weights  Weight (lbs) 200 lb 204 lb 12.8 oz 199 lb  Weight (  kg) 90.719 kg 92.897 kg 90.266 kg     Body mass index is 29.53 kg/m.  General:  Well nourished, well developed, in no acute distress HEENT: normal Neck: no JVD Vascular: No carotid bruits; Distal pulses 2+ bilaterally Cardiac:  irreg-irreg, bradycardic; no murmurs, gallops or rubs Lungs:  CTA b/l, no wheezing, rhonchi or rales  Abd: soft, nontender Ext: no edema Musculoskeletal:  No deformities, Skin: warm and dry  Neuro:  no focal abnormalities noted Psych:  Normal affect   EKG:  The EKG was personally reviewed and demonstrates:   AFlutter 43bpm, narrow QRS, PVC (suspect atypical) AFlutter 42bpm  Telemetry:  Telemetry was personally reviewed and demonstrates:   AFlutter, rates 30's-50's He has intermittent BBB morphology (not rate dependent)  Relevant CV Studies:   04/05/24: TTE 1. Left ventricular ejection fraction, by estimation, is 50 to 55%. The  left ventricle has low normal function. Left ventricular endocardial  border not optimally defined to evaluate regional wall motion. There is  mild concentric left ventricular  hypertrophy. Left ventricular diastolic parameters are indeterminate.   2. Right ventricular systolic function is normal. The right ventricular  size is mildly enlarged.   3. Left atrial size was severely dilated.   4. Right atrial size was severely dilated.   5. The mitral valve is normal in structure. Mild mitral valve  regurgitation. No evidence of mitral stenosis.   6. Multiple jets. Tricuspid valve regurgitation is severe.   7. The aortic valve is tricuspid. There is moderate calcification of the  aortic valve. Aortic valve regurgitation is not visualized. Aortic valve  sclerosis  is present, with no evidence of aortic valve stenosis.   8. The inferior vena cava is dilated in size with <50% respiratory  variability, suggesting right atrial pressure of 15 mmHg.   Laboratory Data:  High Sensitivity Troponin:   Recent Labs  Lab 04/05/24 0549 04/05/24 1300  TROPONINIHS 48* 52*     Chemistry Recent Labs  Lab 04/05/24 0549 04/05/24 1300  NA 140  --   K 4.3  --   CL 109  --   CO2 23  --   GLUCOSE 127*  --   BUN 17  --   CREATININE 1.06 0.97  CALCIUM 9.0  --   MG 2.2  --   GFRNONAA >60 >60  ANIONGAP 8  --     No results for input(s): "PROT", "ALBUMIN", "AST", "ALT", "ALKPHOS", "BILITOT" in the last 168 hours. Lipids No results for input(s): "CHOL", "TRIG", "HDL", "LABVLDL", "LDLCALC", "CHOLHDL" in the last 168 hours.  Hematology Recent Labs  Lab 04/05/24 0549 04/05/24 1300  WBC 10.3 9.0  RBC 4.53 4.54  HGB 14.1 14.0  HCT 43.5 43.9  MCV 96.0 96.7  MCH 31.1 30.8  MCHC 32.4 31.9  RDW 14.3 14.1  PLT 206 208   Thyroid   Recent Labs  Lab 04/05/24 1300  TSH 4.478    BNP Recent Labs  Lab 04/05/24 0550  BNP 829.1*    DDimer No results for input(s): "DDIMER" in the last 168 hours.   Radiology/Studies:   CT CHEST WO CONTRAST Result Date: 04/05/2024 CLINICAL DATA:  Lung nodule EXAM: CT CHEST WITHOUT CONTRAST TECHNIQUE: Multidetector CT imaging of the chest was performed following the standard protocol without IV contrast. RADIATION DOSE REDUCTION: This exam was performed according to the departmental dose-optimization program which includes automated exposure control, adjustment of the mA and/or kV according to patient size and/or use of iterative reconstruction technique. COMPARISON:  CT abdomen and pelvis Mar 30, 2024 FINDINGS: Cardiovascular: No significant vascular findings. Normal heart size. No pericardial effusion. Extensive coronary artery calcifications Mediastinum/Nodes: Large nodular adenopathy along the prevascular aortic space measuring  15 x 20 mm, retrocaval pretracheal adenopathy measuring 11 x 15 mm. Lungs/Pleura: Prominence of the interstitial markings bilaterally could correlate with chronic interstitial lung disease with mild centrilobular emphysema and some atelectatic changes along the right and left mid lung and left lingular region. No obvious pulmonary nodules. Upper Abdomen: Questionable low-attenuation area within the right lobe of the liver on image number 57 correlates with the previously seen lesions on the prior CT of the abdomen refer to prior dictation. Musculoskeletal: No chest wall mass or suspicious bone lesions identified. IMPRESSION: *Prominence of the interstitial markings bilaterally could correlate with chronic interstitial lung disease with mild centrilobular emphysema and some atelectatic changes along the right and left mid lung and left lingular region. *Large nodular adenopathy along the prevascular aortic space measuring 15 x 20 mm, retrocaval pretracheal adenopathy measuring 11 x 15 mm. *Questionable low-attenuation area within the right lobe of the liver on image number 57 correlates with the previously seen lesions on the prior CT of the abdomen refer to prior dictation. Electronically Signed   By: Fredrich Jefferson M.D.   On: 04/05/2024 07:59   DG Chest Portable 1 View Result Date: 04/05/2024 CLINICAL DATA:  Bradycardia. EXAM: PORTABLE CHEST 1 VIEW COMPARISON:  None Available. FINDINGS: Low volume film. The cardio pericardial silhouette is enlarged. Streaky density at the left base suggest atelectasis. Two view study. Circular density identified in the right apex, potentially related to the anterior first rib end. No acute bony abnormality. Telemetry leads overlie the chest. IMPRESSION: 1. Low volume film with streaky density at the left base suggesting atelectasis. 2. Circular density in the right apex, potentially related to the anterior first rib end. CT chest without contrast recommended to exclude pulmonary  parenchymal lesion. Electronically Signed   By: Donnal Fusi M.D.   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 AFlutter Not known previously  3. Falls Unclear mechanisms, poor historian Known to have bad knees   Long d/w patient and his daughter bedside by Dr. Margarito Shearing may be multifactorial though with intermittent CHB would need to consider his bradycardia as a cause The patient and daughter would like to pursue pacing Will look to tomorrow afternoon   Risk Assessment/Risk Scores:   For questions or updates, please contact [facility] Please consult [email or web address] for contact info under    Signed, Debbie Fails, PA-C  04/06/2024 4:36 PM  EP Attending  Patient seen and examined. Agree with the findings as noted above. Discussed with the patient's daughter who is a MD at [employer]. 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 He has had problems with falls. He has dementia though he was conversant and new when he retired. He was admitted after a fall and was found to be bradycardic with HR's in the 30's. In the hospital he has been found to have afib/flutter with a slow VR, mostly on my visit with BBB and HRs in the mid 30's. He also at times speeds up into the high 40's. On exam he is a pleasant elderly man, NAD. Lungs were clear. CV with an IR brady and ext were warm and with only  a trace of edema. Neuro was non-focal. Tele is as above.  A/P Probable symptomatic bradycardia - He may or may not be falling due to his bradycardia/Pauses. However, his chronic brady will only worsen and when he falls and is bradycardic he will only be taken again to the ED. I have offered him PPM insertion and explained the risks/benefits to his daughter. If schedule permits and he is agreeable, then we will plan for tomorrow. If not and he is not worse we could  consider doing this as an outpatient to avoid a prolonged hospital stay so as not to exacerbate his dementia.  Afib/flutter - he is not a candidate for systemic anti-coag due to dementia and propensity to fall.  If his falls resolve after PPM then we could reconsider systemic anti-coag.  Pete Brand Tikesha Mort,MD

## 2024-04-06 NOTE — Progress Notes (Addendum)
 Patient Name: RAEVON SUDBECK Date of Encounter: 04/06/2024 [facility] Cardiologist: Cecylia Brazill K Elleigh Cassetta, MD   Interval Summary  .    Patient went to ER via EMS after presumed fall. Patient has vascular dementia and attributes falls to forgetting to use walker. Has has some shortness of breath over the past week. Denies chest pain, fever, diaphoresis.  Vital Signs .    Vitals:   04/05/24 1230 04/05/24 1316 04/05/24 2130 04/05/24 2315  BP: (!) 152/64  (!) 172/95 (!) 159/81  Pulse: (!) 48  (!) 55 (!) 52  Resp: 16  (!) 30 19  Temp:  97.6 F (36.4 C)    TempSrc:  Oral    SpO2: 100%  98% 100%  Weight:      Height:       No intake or output data in the 24 hours ending 04/06/24 0759    04/05/2024    5:43 AM 02/23/2024   11:35 AM 01/19/2024    3:32 PM  Last 3 Weights  Weight (lbs) 200 lb 204 lb 12.8 oz 199 lb  Weight (kg) 90.719 kg 92.897 kg 90.266 kg      Telemetry/ECG    Atrial flutter with variable AV conduction with rates typically in 30's to 40's Frequent PVC's, brief episodes of elevated ventricular rates this correlates to when the patient is sitting up - Personally Reviewed  Physical Exam .   GEN: No acute distress.   Neck: No JVD Cardiac: Irregular rhythm. Bradycardic. Rates in 60's when sitting up and in 30's to 40's when laying down. Respiratory: Clear to auscultation bilaterally. MS: No edema  Assessment & Plan .   BREYTON SEPANSKI is a 89 y.o. male with a hx of BPH, arthritis, hyperlipidemia, progressive dementia, hypertension who is being seen 04/05/2024 for the evaluation of bradycardia at the request of Dr. Carylon Claude.    New onset Atrial flutter with variable AV conduction Ventricular bradycardia CHA2DS2-VASc Score = 4 [CHF History: 0, HTN History: 1, Diabetes History: 0, Stroke History: 0, Vascular Disease History: 1, Age Score: 2, Gender Score: 0].  Therefore, the patient's annual risk of stroke is 4.8 %.    Presented to ER via EMS from assisted living facility  after being found on the floor during the night (a presumed fall) and bradycardia in the 30's. Patient attributed the "fall" to forgetting his walker as he has vascular dementia. EKG showed atrial flutter with variable AV conduction and a rate of 42. Blood pressures are elevated most recent (159/81)  -- Likely a poor candidate for anticoagulation and pacemaker with frequent falls and progressive dementia. Will discuss pacemaker with EP.  -- avoid AV nodal agents. -- TSH normal, -- Denies difficulty sleeping, day time somnolence or snoring so OSA is unlikely. -- Echo pending -- K at goal > 4, mag at goal>2.0  Elevated BNP (829.1) -- no pulmonary edema on chest CT. Euvolemic on exam. Has no prior history of heart failure. Has had some shortness of breath recently but Denies orthopnea or PND. This finding is likely to bradycardia.  Hypertension -- Continue losartan 25mg  daily -- consider more permissive BP management with bradycardia and falls  Elevated Troponin 48>52 Extensive coronary calcifications Seen on chest CT on 04/05/24 Elevated and flat troponins are secondary to demand ischemia  Hyperlipidemia -- Continue home simvastatin 40mg   Other conditions managed per primary  For questions or updates, please contact [facility] Please consult [email or web address] for contact info under  Signed, Melita Springer, PA-C    ATTENDING ATTESTATION:  After conducting a review of all available clinical information with the care team, interviewing the patient, and performing a physical exam, I agree with the findings and plan described in this note.   GEN: No acute distress.  Seems appropriate.   HEENT:  MMM, no JVD, no scleral icterus Cardiac: Bradycardic, no murmurs, rubs, or gallops.  Respiratory: Clear to auscultation bilaterally. GI: Soft, nontender, non-distended  MS: No edema; No deformity. Neuro:  Nonfocal  Vasc:  +2 radial pulses  Patient HR sometimes in the 30s while  awake.  He has mild cognitive impairment yet seems to be appropriate on my interview.  He is a fall risk, so I do not think A/C is appropriate.  However given conduction disease with slow aflutter, will obtain EP opinion.  Discussed with patient and daughter at bedside.  Alyssa Backbone, MD Pager [phone]

## 2024-04-07 ENCOUNTER — Ambulatory Visit (HOSPITAL_COMMUNITY): Admit: 2024-04-07 | Admitting: Cardiology

## 2024-04-07 ENCOUNTER — Encounter (HOSPITAL_COMMUNITY)
Admission: EM | Disposition: A | Discharge status: Skilled Nursing Facility | Payer: Self-pay | Source: Skilled Nursing Facility | Attending: Internal Medicine

## 2024-04-07 DIAGNOSIS — R001 Bradycardia, unspecified: Secondary | ICD-10-CM

## 2024-04-07 HISTORY — PX: PACEMAKER IMPLANT: EP1218

## 2024-04-07 LAB — BASIC METABOLIC PANEL WITH GFR
Anion gap: 11 (ref 5–15)
BUN: 15 mg/dL (ref 8–23)
CO2: 25 mmol/L (ref 22–32)
Calcium: 8.9 mg/dL (ref 8.9–10.3)
Chloride: 102 mmol/L (ref 98–111)
Creatinine, Ser: 1.02 mg/dL (ref 0.61–1.24)
GFR, Estimated: >60 mL/min (ref >60)
Glucose, Bld: 121 mg/dL — ABNORMAL HIGH (ref 70–99)
Potassium: 4.1 mmol/L (ref 3.5–5.1)
Sodium: 138 mmol/L (ref 135–145)

## 2024-04-07 LAB — CBC
HCT: 47.4 % (ref 39.0–52.0)
Hemoglobin: 15.9 g/dL (ref 13.0–17.0)
MCH: 31.2 pg (ref 26.0–34.0)
MCHC: 33.5 g/dL (ref 30.0–36.0)
MCV: 92.9 fL (ref 80.0–100.0)
Platelets: 236 10*3/uL (ref 150–400)
RBC: 5.1 MIL/uL (ref 4.22–5.81)
RDW: 13.9 % (ref 11.5–15.5)
WBC: 11.2 10*3/uL — ABNORMAL HIGH (ref 4.0–10.5)
nRBC: 0 % (ref 0.0–0.2)

## 2024-04-07 LAB — MAGNESIUM: Magnesium: 2.2 mg/dL (ref 1.7–2.4)

## 2024-04-07 LAB — PHOSPHORUS: Phosphorus: 3.4 mg/dL (ref 2.5–4.6)

## 2024-04-07 SURGERY — PACEMAKER IMPLANT
Anesthesia: LOCAL

## 2024-04-07 MED ORDER — LIDOCAINE HCL (PF) 1 % IJ SOLN
INTRAMUSCULAR | Status: AC
  Filled 2024-04-07: qty 60

## 2024-04-07 MED ORDER — HYDRALAZINE HCL 20 MG/ML IJ SOLN
10.0000 mg | INTRAMUSCULAR | Status: DC | PRN
  Administered 2024-04-07 (×2): 10 mg via INTRAVENOUS
  Filled 2024-04-07 (×2): qty 1

## 2024-04-07 MED ORDER — SODIUM CHLORIDE 0.9 % IV SOLN
INTRAVENOUS | Status: AC
  Filled 2024-04-07: qty 2

## 2024-04-07 MED ORDER — LIDOCAINE HCL (PF) 1 % IJ SOLN
INTRAMUSCULAR | Status: DC | PRN
  Administered 2024-04-07: 60 mL

## 2024-04-07 MED ORDER — CHLORHEXIDINE GLUCONATE 4 % EX SOLN
CUTANEOUS | Status: AC
  Filled 2024-04-07: qty 15

## 2024-04-07 MED ORDER — CEFAZOLIN SODIUM-DEXTROSE 2-4 GM/100ML-% IV SOLN
INTRAVENOUS | Status: AC
  Filled 2024-04-07: qty 100

## 2024-04-07 MED ORDER — HEPARIN (PORCINE) IN NACL 2000-0.9 UNIT/L-% IV SOLN
INTRAVENOUS | Status: DC | PRN
  Administered 2024-04-07: 1000 mL

## 2024-04-07 MED ORDER — DIPHENHYDRAMINE HCL 25 MG PO CAPS
25.0000 mg | ORAL_CAPSULE | Freq: Once | ORAL | Status: AC | PRN
  Administered 2024-04-07: 25 mg via ORAL
  Filled 2024-04-07: qty 1

## 2024-04-07 SURGICAL SUPPLY — 12 items
CABLE SURGICAL S-101-97-12 (CABLE) ×1 IMPLANT
CATH HIS SELECTSITE C304HIS (CATHETERS) IMPLANT
IPG PACE AZUR XT DR MRI W1DR01 (Pacemaker) IMPLANT
LEAD CAPSURE NOVUS 5076-52CM (Lead) IMPLANT
LEAD SELECT SECURE 3830 383069 (Lead) IMPLANT
MAT PREVALON FULL STRYKER (MISCELLANEOUS) IMPLANT
PAD DEFIB RADIO PHYSIO CONN (PAD) ×1 IMPLANT
SHEATH 7FR PRELUDE SNAP 13 (SHEATH) IMPLANT
SHEATH PROBE COVER 6X72 (BAG) IMPLANT
SLITTER 6232ADJ (MISCELLANEOUS) IMPLANT
TRAY PACEMAKER INSERTION (PACKS) ×1 IMPLANT
WIRE HI TORQ VERSACORE-J 145CM (WIRE) IMPLANT

## 2024-04-07 NOTE — Plan of Care (Signed)
  Problem: Education: Goal: Knowledge of General Education information will improve Description: Including pain rating scale, medication(s)/side effects and non-pharmacologic comfort measures Outcome: Progressing   Problem: Skin Integrity: Goal: Risk for impaired skin integrity will decrease Outcome: Progressing   Problem: Cardiac: Goal: Ability to achieve and maintain adequate cardiopulmonary perfusion will improve Outcome: Progressing

## 2024-04-07 NOTE — Progress Notes (Signed)
 OT Cancellation Note  Patient Details Name: GARED GAZDA MRN: [id number] DOB: [birth year]   Cancelled Treatment:    Reason Eval/Treat Not Completed: Patient at procedure or test/ unavailable (Pt off unit at cath lab. OT to reattempt to see pt for acute OT eval at a later time as available/appropriate.)  Jathniel Smeltzer "Jenine Mix" M., OTR/L, MA Acute Rehab [phone]   Walt Gunner 04/07/2024, 3:17 PM

## 2024-04-07 NOTE — Progress Notes (Addendum)
 RE: Daniel Moses  Date of Birth: 02-17-1934  Date: 04/07/2024    To Whom It May Concern:   Please be advised that the above-named patient will require a short-term nursing home stay - anticipated 30 days or less for rehabilitation and strengthening. The plan is for return home.

## 2024-04-07 NOTE — NC FL2 (Signed)
 [place]  MEDICAID [id number] LEVEL OF CARE FORM     IDENTIFICATION  Patient Name: Daniel Moses Birthdate: [birth year] Sex: male Admission Date (Current Location): 04/05/2024  [facility] and [place] Number:  Producer, television/film/video and Address:  [facility], [address] [place], [place] [address]      Provider Number: [id number]  Attending Physician Name and Address:  Rosena Conradi, MD  Relative Name and Phone Number:  Polly Brink (son) [phone]    Current Level of Care: Hospital Recommended Level of Care: Skilled Nursing Facility Prior Approval Number:    Date Approved/Denied:   PASRR Number: PASRR under review  Discharge Plan: SNF    Current Diagnoses: Patient Active Problem List   Diagnosis Date Noted   Flutter-fibrillation [facility]) 04/05/2024   Hyperlipidemia 06/15/2023   Primary hypertension 06/15/2023   Benign prostatic hyperplasia with incomplete bladder emptying 06/15/2023   Unstable gait 06/15/2023   Primary osteoarthritis of both knees 06/15/2023   Mild cognitive impairment 06/15/2023   Varicose veins of bilateral lower extremities with other complications 08/03/2013    Orientation RESPIRATION BLADDER Height & Weight     Self, Time, Situation, Place  Normal Incontinent Weight: 200 lb (90.7 kg) Height:  5\' 9"  (175.3 cm)  BEHAVIORAL SYMPTOMS/MOOD NEUROLOGICAL BOWEL NUTRITION STATUS      Continent (WDL) Diet (Please see discharge summary)  AMBULATORY STATUS COMMUNICATION OF NEEDS Skin   Extensive Assist Verbally Other (Comment) (Abrasion,elbow,arm,R,Ecchymosis,arm,leg,face,Bil.,Wound/Incision LDAs)                       Personal Care Assistance Level of Assistance  Bathing, Dressing, Feeding Bathing Assistance: Limited assistance Feeding assistance: Limited assistance Dressing Assistance: Limited assistance     Functional Limitations Info  Sight, Hearing, Speech Sight Info: Impaired Hearing Info: Impaired Speech Info: Adequate     SPECIAL CARE FACTORS FREQUENCY  PT (By licensed PT), OT (By licensed OT)     PT Frequency: 5x min weekly OT Frequency: 5x min weekly            Contractures Contractures Info: Not present    Additional Factors Info  Code Status, Allergies Code Status Info: FULL Allergies Info: Atorvastatin           Current Medications (04/07/2024):  This is the current hospital active medication list Current Facility-Administered Medications  Medication Dose Route Frequency Provider Last Rate Last Admin   0.9 %  sodium chloride infusion  250 mL Intravenous Continuous Ursuy, Frazier Jacob, PA-C       ceFAZolin (ANCEF) IVPB 2g/100 mL premix  2 g Intravenous On Call Debbie Fails, PA-C       enoxaparin (LOVENOX) injection 40 mg  40 mg Subcutaneous Q24H Arne Langdon, MD   40 mg at 04/07/24 1258   ezetimibe (ZETIA) tablet 10 mg  10 mg Oral Daily Arne Langdon, MD   10 mg at 04/07/24 1258   finasteride (PROSCAR) tablet 5 mg  5 mg Oral q AM Arne Langdon, MD   5 mg at 04/07/24 0618   fluticasone (FLONASE) 50 MCG/ACT nasal spray 1 spray  1 spray Each Nare BID PRN Arne Langdon, MD       gentamicin (GARAMYCIN) 80 mg in sodium chloride 0.9 % 500 mL irrigation  80 mg Irrigation On Call Debbie Fails, PA-C       hydrALAZINE (APRESOLINE) injection 10 mg  10 mg Intravenous Q4H PRN Howerter, Justin B, DO   10 mg at 04/07/24 [phone]  losartan (COZAAR) tablet 25 mg  25 mg Oral q morning Arne Langdon, MD   25 mg at 04/07/24 1258   mupirocin ointment (BACTROBAN) 2 % 1 Application  1 Application Nasal BID Pokhrel, Laxman, MD   1 Application at 04/07/24 1030   simvastatin (ZOCOR) tablet 40 mg  40 mg Oral QPM Arne Langdon, MD   40 mg at 04/06/24 1754   sodium chloride flush (NS) 0.9 % injection 3 mL  3 mL Intravenous Q12H Ursuy, Renee Lynn, PA-C   3 mL at 04/07/24 1030   sodium chloride flush (NS) 0.9 % injection 3 mL  3 mL Intravenous PRN Ursuy, Renee Lynn, PA-C       sodium chloride flush (NS) 0.9 %  injection 3-10 mL  3-10 mL Intravenous Q12H Ursuy, Renee Lynn, PA-C   3 mL at 04/06/24 2231   sodium chloride flush (NS) 0.9 % injection 3-10 mL  3-10 mL Intravenous PRN Debbie Fails, PA-C       tamsulosin (FLOMAX) capsule 0.4 mg  0.4 mg Oral Daily Arne Langdon, MD   0.4 mg at 04/07/24 1258     Discharge Medications: Please see discharge summary for a list of discharge medications.  Relevant Imaging Results:  Relevant Lab Results:   Additional Information [id number]  Carmon Christen, LCSWA

## 2024-04-07 NOTE — Progress Notes (Signed)
 Transported off unit to Cath Lab

## 2024-04-07 NOTE — Progress Notes (Signed)
   04/06/24 2300  Assess: MEWS Score  Temp 98.3 F (36.8 C)  BP 135/75  MAP (mmHg) 95  Pulse Rate (!) 33  ECG Heart Rate (!) 32  Resp 18  SpO2 96 %  O2 Device Room Air  Assess: MEWS Score  MEWS Temp 0  MEWS Systolic 0  MEWS Pulse 2  MEWS RR 0  MEWS LOC 0  MEWS Score 2  MEWS Score Color Yellow  Assess: if the MEWS score is Yellow or Red  Were vital signs accurate and taken at a resting state? Yes  Does the patient meet 2 or more of the SIRS criteria? No  MEWS guidelines implemented  Yes, yellow  Treat  MEWS Interventions Considered administering scheduled or prn medications/treatments as ordered  Take Vital Signs  Increase Vital Sign Frequency  Yellow: Q2hr x1, continue Q4hrs until patient remains green for 12hrs  Escalate  MEWS: Escalate Yellow: Discuss with charge nurse and consider notifying provider and/or RRT  Assess: SIRS CRITERIA  SIRS Temperature  0  SIRS Respirations  0  SIRS Pulse 0  SIRS WBC 0  SIRS Score Sum  0   Patient with HR low of 35 when I came on shift. From about 1900 to 2330, patient's HR had recovered to 50s or above consistent. Around 2330, patient again exhibiting sever bradycardia with new lowest of 28. HR does not sustain, in 20s, rather drops to 28 and immediately goes back up to 30s-50s. MD paged with information of new low in 1s. Patient asymptomatic and sleeping during the fluctuating range of 28-50. Yellow MEWS enacted.

## 2024-04-07 NOTE — Progress Notes (Signed)
 Rounding Note    Patient Name: Daniel Moses Date of Encounter: 04/07/2024  [facility] Cardiologist: Arun K Thukkani, MD   Subjective   No complaints, says he recalls his talk with Dr. Carolynne Citron yesterday, son is bedside  Inpatient Medications    Scheduled Meds:  enoxaparin (LOVENOX) injection  40 mg Subcutaneous Q24H   ezetimibe  10 mg Oral Daily   finasteride  5 mg Oral q AM   gentamicin (GARAMYCIN) 80 mg in sodium chloride 0.9 % 500 mL irrigation  80 mg Irrigation On Call   losartan  25 mg Oral q morning   mupirocin ointment  1 Application Nasal BID   simvastatin  40 mg Oral QPM   sodium chloride flush  3 mL Intravenous Q12H   sodium chloride flush  3-10 mL Intravenous Q12H   tamsulosin  0.4 mg Oral Daily   Continuous Infusions:  sodium chloride      ceFAZolin (ANCEF) IV     PRN Meds: fluticasone, hydrALAZINE, sodium chloride flush, sodium chloride flush   Vital Signs    Vitals:   04/07/24 0300 04/07/24 0337 04/07/24 0407 04/07/24 0744  BP: (!) 171/84 (!) 188/95 (!) 147/110 (!) 173/95  Pulse:    (!) 55  Resp: 18   20  Temp: 98.2 F (36.8 C)   98.1 F (36.7 C)  TempSrc:    Oral  SpO2: 96%   97%  Weight:      Height:       No intake or output data in the 24 hours ending 04/07/24 1032    04/05/2024    5:43 AM 02/23/2024   11:35 AM 01/19/2024    3:32 PM  Last 3 Weights  Weight (lbs) 200 lb 204 lb 12.8 oz 199 lb  Weight (kg) 90.719 kg 92.897 kg 90.266 kg      Telemetry    AFib 40's-70;s c/w intermittent BBB/regularization of rate c/w transient CHB - Personally Reviewed  ECG    No new EKGs - Personally Reviewed  Physical Exam   GEN: No acute distress.   Neck: No JVD Cardiac: irreg-irreg, no murmurs, rubs, or gallops.  Respiratory: CTA b/l GI: Soft, nontender, non-distended  MS: No edema; No deformity. Neuro:  Nonfocal  Psych: Normal affect   Labs    High Sensitivity Troponin:   Recent Labs  Lab 04/05/24 0549 04/05/24 1300   TROPONINIHS 48* 52*     Chemistry Recent Labs  Lab 04/05/24 0549 04/05/24 1300 04/07/24 0539  NA 140  --  138  K 4.3  --  4.1  CL 109  --  102  CO2 23  --  25  GLUCOSE 127*  --  121*  BUN 17  --  15  CREATININE 1.06 0.97 1.02  CALCIUM 9.0  --  8.9  MG 2.2  --  2.2  GFRNONAA >60 >60 >60  ANIONGAP 8  --  11    Lipids No results for input(s): "CHOL", "TRIG", "HDL", "LABVLDL", "LDLCALC", "CHOLHDL" in the last 168 hours.  Hematology Recent Labs  Lab 04/05/24 0549 04/05/24 1300 04/07/24 0539  WBC 10.3 9.0 11.2*  RBC 4.53 4.54 5.10  HGB 14.1 14.0 15.9  HCT 43.5 43.9 47.4  MCV 96.0 96.7 92.9  MCH 31.1 30.8 31.2  MCHC 32.4 31.9 33.5  RDW 14.3 14.1 13.9  PLT 206 208 236   Thyroid   Recent Labs  Lab 04/05/24 1300  TSH 4.478    BNP Recent Labs  Lab 04/05/24  0550  BNP 829.1*    DDimer No results for input(s): "DDIMER" in the last 168 hours.   Radiology      Cardiac Studies   04/05/24: TTE 1. Left ventricular ejection fraction, by estimation, is 50 to 55%. The  left ventricle has low normal function. Left ventricular endocardial  border not optimally defined to evaluate regional wall motion. There is  mild concentric left ventricular  hypertrophy. Left ventricular diastolic parameters are indeterminate.   2. Right ventricular systolic function is normal. The right ventricular  size is mildly enlarged.   3. Left atrial size was severely dilated.   4. Right atrial size was severely dilated.   5. The mitral valve is normal in structure. Mild mitral valve  regurgitation. No evidence of mitral stenosis.   6. Multiple jets. Tricuspid valve regurgitation is severe.   7. The aortic valve is tricuspid. There is moderate calcification of the  aortic valve. Aortic valve regurgitation is not visualized. Aortic valve  sclerosis is present, with no evidence of aortic valve stenosis.   8. The inferior vena cava is dilated in size with <50% respiratory  variability,  suggesting right atrial pressure of 15 mmHg.   Patient Profile     89 y.o. male w/PMHx of BPH, arthritis, hyperlipidemia, progressive dementia, hypertension   Admitted after a (presumed) fall ALF staff found his pulse slow and was brought via EMS  Home meds No traditional nodal blocking agents Galantamine appears to be able to cause bradycardia/AV block > though is for his ALzhemier's and would be reluctant to  stop  Assessment & Plan    Bradycardia Rates 30's-50's Intermittent BBB that is regular c/w CHB  Baseline otherwise with a narrow QRS  Lengthy discussion yesterday by Dr. Carolynne Citron with the patient (known dementia) and his daughter bedside Of late frequent falls (suspect multifactorial) though bradycardia may play a role Planned to pursue PPM. Suspect dual chamber (rather then single) Preserved LVEF, narrow QRS  Son is bedside this morning No follow up questions from patient/son/remain agreeable to proceed Pending lab flow late today, they are aware.    2. AFlutter Not known previously No historical EKGs in Epic or Care everywhere Unknown onset Not felt to be an a/c candidate with his falls (daughter is in full agreement) Perhaps if with pacing falls stop > could be revisited   3. Falls Unclear mechanisms, poor historian Known to have bad knees  For questions or updates, please contact [facility] Please consult [email or web address] for contact info under        Signed, Debbie Fails, PA-C  04/07/2024, 10:32 AM

## 2024-04-07 NOTE — Progress Notes (Signed)
 [facility] night cross cover note:   I was notified by the patient's RN of most recent BP of 171/84. Remains bradycardic, and is being scheduled for pacemaker placement in the morning.  The patient was noted to be asleep at the time of the above hypertensive reading.  He remains afebrile, with respiratory rate 18-20 and oxygen saturation 96% on room air.  I subsequently added as needed IV hydralazine for systolic BP > 170 mmHg or diastolic BP > 100 mmHg.    Camelia Cavalier, DO Hospitalist

## 2024-04-07 NOTE — Plan of Care (Signed)
 Alert, oriented to self and place only.  Confusion noted.  PPM placed. PIV requested from IV team on right arm.  Son agreeable to spend overnight with father due to confusion.  Left arm in sling post procedure.   Problem: Education: Goal: Knowledge of General Education information will improve Description: Including pain rating scale, medication(s)/side effects and non-pharmacologic comfort measures Outcome: Progressing   Problem: Health Behavior/Discharge Planning: Goal: Ability to manage health-related needs will improve Outcome: Progressing   Problem: Clinical Measurements: Goal: Ability to maintain clinical measurements within normal limits will improve Outcome: Progressing Goal: Will remain free from infection Outcome: Progressing Goal: Diagnostic test results will improve Outcome: Progressing Goal: Respiratory complications will improve Outcome: Progressing

## 2024-04-07 NOTE — Progress Notes (Addendum)
 Arrived back from Cath Lab, placed on telemetry monitor and vitals obtained.  Sling to left arm.  Instructed to keep left arm in sling.  PIV due to be removed on left side, IV team consult placed for IV access on right arm per Dr. Marven Slimmer with EP.     04/07/24 1701  Vitals  Temp 98.9 F (37.2 C)  Temp Source Oral  BP (!) 151/120  MAP (mmHg) 132  BP Location Right  Arm  BP Method Automatic  Patient Position (if appropriate) Lying  Pulse Rate 61  Pulse Rate Source Monitor  ECG Heart Rate 62  Resp 17  MEWS COLOR  MEWS Score Color Green  Oxygen Therapy  SpO2 95 %  O2 Device Room Air  Pain Assessment  Pain Scale 0-10  Pain Score 0  MEWS Score  MEWS Temp 0  MEWS Systolic 0  MEWS Pulse 0  MEWS RR 0  MEWS LOC 0  MEWS Score 0

## 2024-04-07 NOTE — Discharge Instructions (Signed)
 After Your Pacemaker  You have an [employer] pacemaker  ACTIVITY Do not lift your arm above shoulder height for 1 week after your procedure. After 7 days, you may progress as below.  You should remove your sling 24 hours after your procedure, unless otherwise instructed by your provider.     Thursday Apr 14, 2024  Friday Apr 15, 2024 Saturday Apr 16, 2024 Sunday Apr 17, 2024   Do not lift, push, pull, or carry anything over 10 pounds with the affected arm until 6 weeks (Thursday May 19, 2024 ) after your procedure.   You may drive AFTER your wound check, unless you have been told otherwise by your provider.   Ask your healthcare provider when you can go back to work   INCISION/Dressing If you are on a blood thinner such as Coumadin, Xarelto, Eliquis , Plavix , or Pradaxa please confirm with your provider when this should be resumed. Eliquis  on 04/10/24  If large square, outer bandage is left in place, this can be removed after 24 hours from your procedure. Do not remove steri-strips or glue as below.   If a PRESSURE DRESSING (a bulky dressing that usually goes up over your shoulder) was applied or left in place, please follow instructions given by your provider on when to return to have this removed.   Monitor your Pacemaker site for redness, swelling, and drainage. Call the device clinic at [phone] if you experience these symptoms or fever/chills.  If your incision is sealed with Steri-strips or staples, you may shower 7 days after your procedure or when told by your provider. Do not remove the steri-strips or let the shower hit directly on your site. You may wash around your site with soap and water.    If you were discharged in a sling, please do not wear this during the day more than 48 hours after your surgery unless otherwise instructed. This may increase the risk of stiffness and soreness in your shoulder.   Avoid lotions, ointments, or perfumes over your incision until it is  well-healed.  You may use a hot tub or a pool AFTER your wound check appointment if the incision is completely closed.  Pacemaker Alerts:  Some alerts are vibratory and others beep. These are NOT emergencies. Please call our office to let us  know. If this occurs at night or on weekends, it can wait until the next business day. Send a remote transmission.  If your device is capable of reading fluid status (for heart failure), you will be offered monthly monitoring to review this with you.   DEVICE MANAGEMENT Remote monitoring is used to monitor your pacemaker from home. This monitoring is scheduled every 91 days by our office. It allows us  to keep an eye on the functioning of your device to ensure it is working properly. You will routinely see your Electrophysiologist annually (more often if necessary).   You should receive your ID card for your new device in 4-8 weeks. Keep this card with you at all times once received. Consider wearing a medical alert bracelet or necklace.  Your Pacemaker may be MRI compatible. This will be discussed at your next office visit/wound check.  You should avoid contact with strong electric or magnetic fields.   Do not use amateur (ham) radio equipment or electric (arc) welding torches. MP3 player headphones with magnets should not be used. Some devices are safe to use if held at least 12 inches (30 cm) from your Pacemaker. These include power tools,  lawn mowers, and speakers. If you are unsure if something is safe to use, ask your health care provider.  When using your cell phone, hold it to the ear that is on the opposite side from the Pacemaker. Do not leave your cell phone in a pocket over the Pacemaker.  You may safely use electric blankets, heating pads, computers, and microwave ovens.  Call the office right away if: You have chest pain. You feel more short of breath than you have felt before. You feel more light-headed than you have felt before. Your  incision starts to open up.  This information is not intended to replace advice given to you by your health care provider. Make sure you discuss any questions you have with your health care provider.

## 2024-04-07 NOTE — Progress Notes (Addendum)
 PROGRESS NOTE  Daniel Moses:[id number] DOB: May 23, 1934 DOA: 04/05/2024 PCP: Marguerite Shiley, MD   LOS: 2 days   Brief narrative:  Daniel Moses is a 89 y.o. male with past medical history significant of mild cognitive impairment,BPH presented to hospital after sustaining a fall on the floor without any loss of consciousness.  Has had falls in the past as well.  In the ED patient was noted to have irregular rhythm and EKG and was subsequently admitted to hospital for further evaluation and treatment.   Assessment/Plan: Principal Problem:   Flutter-fibrillation (HCC) Active Problems:   Hyperlipidemia   Primary hypertension   Unstable gait  New onset Afib/flutter with bradycardia, variable AV conduction. Elevated BNP Demand ischemia Continue telemetry monitoring.  New onset of atrial fibrillation flutter with bradycardia..  CHA2DS2-VASc score 4.  Poor anticoagulation candidate as per cardiology due to falls and progressive dementia.  Cardiology on board.  Continue to hold beta-blocker.    2D echocardiogram shows LV ejection fraction of 50 to 55% with LVH.  Patient euvolemic at this time.  Likely dyspnea from bradycardia.  Electrophysiology Cardiology has plans for pacemaker placement.  Essential hypertension Continue losartan, hydralazine blood pressure seems to be elevated.  Add as needed hydralazine as well.   Hyperlipidemia Continue statins   BPH Continue finasteride and Flomax.  Generalized weakness,Physical decondioning, unstable gait Physical therapy  recommended skilled nursing facility placement on discharge.  TOC on board.  History of vascular dementia. Currently at the skilled nursing facility.  Continue supportive care.  Delirium precautions.  DVT prophylaxis: enoxaparin (LOVENOX) injection 40 mg Start: 04/05/24 1200   Disposition: Skilled nursing facility as per PT evaluation.  Status is: Inpatient  Remains inpatient appropriate because: A flutter with  bradycardia, need for rehabilitation, EP cardiology recommendation of permanent pacemaker  Code Status:     Code Status: Full Code  Family Communication: Spoke with the patient's spouse on the phone on 04/07/2024  Consultants: Cardiology  Procedures: None  Anti-infectives:  None  Anti-infectives (From admission, onward)    Start     Dose/Rate Route Frequency Ordered Stop   04/06/24 2030  gentamicin (GARAMYCIN) 80 mg in sodium chloride 0.9 % 500 mL irrigation        80 mg Irrigation On call 04/06/24 1937 04/07/24 2030   04/06/24 2030  ceFAZolin (ANCEF) IVPB 2g/100 mL premix        2 g 200 mL/hr over 30 Minutes Intravenous On call 04/06/24 1937 04/07/24 2030      Subjective: Today, patient was seen and examined at bedside.  Patient denies any chest pain, dizziness, lightheadedness, shortness of breath, fever, chills or rigor. Objective: Vitals:   04/07/24 0744 04/07/24 1100  BP: (!) 173/95 (!) 172/109  Pulse: (!) 55 (!) 50  Resp: 20 20  Temp: 98.1 F (36.7 C) 98 F (36.7 C)  SpO2: 97% 99%    Intake/Output Summary (Last 24 hours) at 04/07/2024 1250 Last data filed at 04/07/2024 1115 Gross per 24 hour  Intake --  Output 800 ml  Net -800 ml   Filed Weights   04/05/24 0543  Weight: 90.7 kg   Body mass index is 29.53 kg/m.   Physical Exam: GENERAL: Patient is alert awake and oriented. Not in obvious distress.  Elderly male, Communicative, HENT: No scleral pallor or icterus. Pupils equally reactive to light. Oral mucosa is moist NECK: is supple, no gross swelling noted. CHEST: Clear to auscultation. No crackles or wheezes.   CVS: S1  and S2 heard, no murmur.  Irregular rhythm with bradycardia. ABDOMEN: Soft, non-tender, bowel sounds are present. EXTREMITIES: No edema. CNS: Cranial nerves are intact. No focal motor deficits. SKIN: warm and dry without rashes.  Data Review: I have personally reviewed the following laboratory data and studies,  CBC: Recent Labs   Lab 2024/04/24 0549 2024-04-24 1300 04/07/24 0539  WBC 10.3 9.0 11.2*  HGB 14.1 14.0 15.9  HCT 43.5 43.9 47.4  MCV 96.0 96.7 92.9  PLT 206 208 236   Basic Metabolic Panel: Recent Labs  Lab 04-24-2024 0549 24-Apr-2024 1300 04/07/24 0539  NA 140  --  138  K 4.3  --  4.1  CL 109  --  102  CO2 23  --  25  GLUCOSE 127*  --  121*  BUN 17  --  15  CREATININE 1.06 0.97 1.02  CALCIUM 9.0  --  8.9  MG 2.2  --  2.2  PHOS  --   --  3.4   Liver Function Tests: No results for input(s): "AST", "ALT", "ALKPHOS", "BILITOT", "PROT", "ALBUMIN" in the last 168 hours. No results for input(s): "LIPASE", "AMYLASE" in the last 168 hours. No results for input(s): "AMMONIA" in the last 168 hours. Cardiac Enzymes: No results for input(s): "CKTOTAL", "CKMB", "CKMBINDEX", "TROPONINI" in the last 168 hours. BNP (last 3 results) Recent Labs    04-24-2024 0550  BNP 829.1*    ProBNP (last 3 results) No results for input(s): "PROBNP" in the last 8760 hours.  CBG: No results for input(s): "GLUCAP" in the last 168 hours. Recent Results (from the past 240 hours)  Surgical PCR screen     Status: None   Collection Time: 04/06/24  7:38 PM   Specimen: Nasal Mucosa; Nasal Swab  Result Value Ref Range Status   MRSA, PCR NEGATIVE NEGATIVE Final   Staphylococcus aureus NEGATIVE NEGATIVE Final    Comment: (NOTE) The Xpert SA Assay (FDA approved for NASAL specimens in patients 59 years of age and older), is one component of a comprehensive surveillance program. It is not intended to diagnose infection nor to guide or monitor treatment. Performed at [facility], [address] [place], [place] [address]      Studies: ECHOCARDIOGRAM COMPLETE Result Date: Apr 24, 2024    ECHOCARDIOGRAM REPORT   Patient Name:   Daniel Moses Date of Exam: April 24, 2024 Medical Rec #:  [id number]    Height:       69.0 in Accession #:    [id number]   Weight:       200.0 lb Date of Birth:  12/29/1933   BSA:          2.066 m  Patient Age:    89 years     BP:           168/80 mmHg Patient Gender: M            HR:           40 bpm. Exam Location:  Inpatient Procedure: 2D Echo, Color Doppler, Cardiac Doppler and Intracardiac            Opacification Agent (Both Spectral and Color Flow Doppler were            utilized during procedure). Indications:    Atrial fibrillation and Flutter  History:        Patient has no prior history of Echocardiogram examinations.  Risk Factors:Hypertension and Dyslipidemia.  Sonographer:    Sherline Distel Senior RDCS Referring Phys: [id number] Arne Langdon  Sonographer Comments: Very poor apical windows due to patient body habitus. IMPRESSIONS  1. Left ventricular ejection fraction, by estimation, is 50 to 55%. The left ventricle has low normal function. Left ventricular endocardial border not optimally defined to evaluate regional wall motion. There is mild concentric left ventricular hypertrophy. Left ventricular diastolic parameters are indeterminate.  2. Right ventricular systolic function is normal. The right ventricular size is mildly enlarged.  3. Left atrial size was severely dilated.  4. Right atrial size was severely dilated.  5. The mitral valve is normal in structure. Mild mitral valve regurgitation. No evidence of mitral stenosis.  6. Multiple jets. Tricuspid valve regurgitation is severe.  7. The aortic valve is tricuspid. There is moderate calcification of the aortic valve. Aortic valve regurgitation is not visualized. Aortic valve sclerosis is present, with no evidence of aortic valve stenosis.  8. The inferior vena cava is dilated in size with <50% respiratory variability, suggesting right atrial pressure of 15 mmHg. FINDINGS  Left Ventricle: No Strain or 3D Transmitted. Left ventricular ejection fraction, by estimation, is 50 to 55%. The left ventricle has low normal function. Left ventricular endocardial border not optimally defined to evaluate regional wall motion. Definity contrast agent  was given IV to delineate the left ventricular endocardial borders. Strain was performed and the global longitudinal strain is indeterminate. The left ventricular internal cavity size was normal in size. There is mild concentric left ventricular hypertrophy. Left ventricular diastolic parameters are indeterminate. Right Ventricle: The right ventricular size is mildly enlarged. No increase in right ventricular wall thickness. Right ventricular systolic function is normal. Left Atrium: Left atrial size was severely dilated. Right Atrium: Right atrial size was severely dilated. Pericardium: There is no evidence of pericardial effusion. Mitral Valve: The mitral valve is normal in structure. Mild mitral valve regurgitation. No evidence of mitral valve stenosis. Tricuspid Valve: Multiple jets. The tricuspid valve is normal in structure. Tricuspid valve regurgitation is severe. Aortic Valve: The aortic valve is tricuspid. There is moderate calcification of the aortic valve. Aortic valve regurgitation is not visualized. Aortic valve sclerosis is present, with no evidence of aortic valve stenosis. Pulmonic Valve: The pulmonic valve was not well visualized. Pulmonic valve regurgitation is not visualized. No evidence of pulmonic stenosis. Aorta: The aortic root and ascending aorta are structurally normal, with no evidence of dilitation. Venous: The inferior vena cava is dilated in size with less than 50% respiratory variability, suggesting right atrial pressure of 15 mmHg. IAS/Shunts: No atrial level shunt detected by color flow Doppler. Additional Comments: 3D was performed not requiring image post processing on an independent workstation and was indeterminate.  LEFT VENTRICLE PLAX 2D LVIDd:         5.00 cm LVIDs:         3.60 cm LV PW:         1.10 cm LV IVS:        1.00 cm LVOT diam:     2.20 cm LV SV:         71 LV SV Index:   35 LVOT Area:     3.80 cm  RIGHT VENTRICLE RV S prime:     10.20 cm/s TAPSE (M-mode): 1.8 cm  LEFT ATRIUM              Index        RIGHT ATRIUM  Index LA diam:        4.70 cm  2.27 cm/m   RA Area:     34.80 cm LA Vol (A2C):   115.0 ml 55.66 ml/m  RA Volume:   129.00 ml 62.44 ml/m LA Vol (A4C):   99.2 ml  48.02 ml/m LA Biplane Vol: 110.0 ml 53.24 ml/m  AORTIC VALVE LVOT Vmax:   88.60 cm/s LVOT Vmean:  61.200 cm/s LVOT VTI:    0.188 m  AORTA Ao Root diam: 3.20 cm Ao Asc diam:  3.50 cm TRICUSPID VALVE TR Peak grad:   40.7 mmHg TR Vmax:        319.00 cm/s  SHUNTS Systemic VTI:  0.19 m Systemic Diam: 2.20 cm Gloriann Larger MD Electronically signed by Gloriann Larger MD Signature Date/Time: 04/05/2024/6:11:16 PM    Final       Rosena Conradi, MD  [facility] 04/07/2024  If 7PM-7AM, please contact night-coverage

## 2024-04-07 NOTE — TOC Initial Note (Addendum)
 Transition of Care [facility]) - Initial/Assessment Note    Patient Details  Name: Daniel Moses MRN: [id number] Date of Birth: [birth year]  Transition of Care [facility]) CM/SW Contact:    Carmon Christen, LCSWA Phone Number: 04/07/2024, 11:50 AM  Clinical Narrative:                  CSW received consult for possible SNF placement at time of discharge. CSW spoke with patient and patients son Polly Brink at bedside regarding PT recommendation of SNF placement at time of discharge.PTA patient reports he comes from [facility].Patient expressed understanding of PT recommendation and is agreeable to SNF placement at [facility] when medically stable for dc.  No further questions reported at this time. CSW spoke with [place] with [facility] who confirmed SNF bed for patient. [place] informed CSW patient will be private pay/CSW informed patient and patients son,who is agreeable, and facility will need dc summary and FL2 at dc.Patients passr is pending.CSW submitted clinicals to [place] must for review.CSW to continue to follow and assist with discharge planning needs.    Expected Discharge Plan: Skilled Nursing Facility Barriers to Discharge: Continued Medical Work up   Patient Goals and CMS Choice Patient states their goals for this hospitalization and ongoing recovery are:: SNF   Choice offered to / list presented to : Patient, Adult Children (patient and son)      Expected Discharge Plan and Services In-house Referral: Clinical Social Work     Living arrangements for the past 2 months: Assisted Living Facility                                      Prior Living Arrangements/Services Living arrangements for the past 2 months: Assisted Living Facility Lives with:: Facility Resident Patient language and need for interpreter reviewed:: Yes Do you feel safe going back to the place where you live?: No   SNF  Need for Family Participation in Patient Care: Yes (Comment) Care giver support system in  place?: Yes (comment)   Criminal Activity/Legal Involvement Pertinent to Current Situation/Hospitalization: No - Comment as needed  Activities of Daily Living   ADL Screening (condition at time of admission) Independently performs ADLs?: No Does the patient have a NEW difficulty with bathing/dressing/toileting/self-feeding that is expected to last >3 days?: No Does the patient have a NEW difficulty with getting in/out of bed, walking, or climbing stairs that is expected to last >3 days?: No Does the patient have a NEW difficulty with communication that is expected to last >3 days?: No Is the patient deaf or have difficulty hearing?: No Does the patient have difficulty seeing, even when wearing glasses/contacts?: No Does the patient have difficulty concentrating, remembering, or making decisions?: Yes  Permission Sought/Granted Permission sought to share information with : Case Manager, Family Supports, Oceanographer granted to share information with : Yes, Verbal Permission Granted  Share Information with NAME: Polly Brink  Permission granted to share info w AGENCY: SNF  Permission granted to share info w Relationship: son  Permission granted to share info w Contact Information: Polly Brink [phone]  Emotional Assessment Appearance:: Appears stated age Attitude/Demeanor/Rapport: Gracious Affect (typically observed): Calm Orientation: : Oriented to Self, Oriented to Place, Oriented to  Time, Oriented to Situation Alcohol  / Substance Use: Not Applicable Psych Involvement: No (comment)  Admission diagnosis:  Flutter-fibrillation (HCC) [I48.91, I48.92] Bradycardia [R00.1] Patient Active Problem List   Diagnosis Date  Noted   Flutter-fibrillation (HCC) 04/05/2024   Hyperlipidemia 06/15/2023   Primary hypertension 06/15/2023   Benign prostatic hyperplasia with incomplete bladder emptying 06/15/2023   Unstable gait 06/15/2023   Primary osteoarthritis of both knees  06/15/2023   Mild cognitive impairment 06/15/2023   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 [place] [address] Phone: [phone] Fax: [phone]     Social Drivers of Health (SDOH) Social History: SDOH Screenings   Food Insecurity: No Food Insecurity (04/06/2024)  Housing: Low Risk  (04/06/2024)  Transportation Needs: No Transportation Needs (04/06/2024)  Utilities: Not At Risk (04/06/2024)  Depression (PHQ2-9): Low Risk  (02/23/2024)  Social Connections: Moderately Isolated (04/06/2024)  Tobacco Use: High Risk (04/05/2024)   SDOH Interventions:     Readmission Risk Interventions     No data to display

## 2024-04-08 ENCOUNTER — Encounter (HOSPITAL_COMMUNITY): Payer: Self-pay | Admitting: Cardiology

## 2024-04-08 ENCOUNTER — Encounter: Payer: Self-pay | Admitting: Internal Medicine

## 2024-04-08 ENCOUNTER — Inpatient Hospital Stay (HOSPITAL_COMMUNITY)

## 2024-04-08 MED ORDER — ADULT MULTIVITAMIN W/MINERALS CH
1.0000 | ORAL_TABLET | Freq: Every morning | ORAL | Status: DC
  Administered 2024-04-08: 1 via ORAL
  Filled 2024-04-08: qty 1

## 2024-04-08 MED ORDER — MEMANTINE HCL 10 MG PO TABS
5.0000 mg | ORAL_TABLET | Freq: Two times a day (BID) | ORAL | Status: DC
  Administered 2024-04-08: 5 mg via ORAL
  Filled 2024-04-08: qty 1

## 2024-04-08 MED ORDER — ACETAMINOPHEN 325 MG PO TABS
650.0000 mg | ORAL_TABLET | Freq: Four times a day (QID) | ORAL | Status: DC | PRN
  Administered 2024-04-08: 650 mg via ORAL
  Filled 2024-04-08: qty 2

## 2024-04-08 MED ORDER — FINASTERIDE 5 MG PO TABS
5.0000 mg | ORAL_TABLET | Freq: Every day | ORAL | Status: DC
  Administered 2024-04-08: 5 mg via ORAL
  Filled 2024-04-08: qty 1

## 2024-04-08 MED ORDER — GALANTAMINE HYDROBROMIDE ER 8 MG PO CP24
8.0000 mg | ORAL_CAPSULE | Freq: Every day | ORAL | Status: DC
Start: 2024-04-09 — End: 2024-04-08

## 2024-04-08 MED ORDER — ACETAMINOPHEN 325 MG PO TABS
650.0000 mg | ORAL_TABLET | Freq: Once | ORAL | Status: AC
  Administered 2024-04-08: 650 mg via ORAL
  Filled 2024-04-08: qty 2

## 2024-04-08 NOTE — Evaluation (Signed)
 Occupational Therapy Evaluation Patient Details Name: Daniel Moses MRN: [id number] DOB: 08/26/1934 Today's Date: 04/08/2024   History of Present Illness   89 y.o. male presents to [facility] 04/05/24 from ALF for evaluation of bradycardia after falling out of his bed with inability to stand. Pt with new onset a-flutter with variable AV conduction, bradycardia, and demand ischemia. Pt now s/p [facility] placement on 5/15. PMHx: mild cognitive impairment, HLD, and BPH p/w GLF and c/f new onset Afib/flutter.     Clinical Impressions At baseline, pt completes ADLs Mod I to Supervision, functional mobility with a RW or Rollator with intermittent assist, and receives assistance for IADLs. Pt with a history of falls. Pt now presents with decreased knowledge of precautions, decreased activity tolerance, generalized B UE weakness, impaired cognition (at or very near baseline), decreased balance, decreased knowledge of AE/DME, and decreased safety and independence with functional tasks. Pt currently demonstrates ability to complete UB ADLs with Set up/Supervision to Max assist, LB ADLs with Max to Total assist, and bed mobility with Mod to Max assist. Pt requires Max cues to adhere to ICD/pacemaker precautions during tasks. Pt will benefit from acute skilled OT services to address deficits outlined below and to increase safety and independence with functional tasks. Post acute discharge, pt will benefit from intensive inpatient skilled rehab services < 3 hours per day to maximize rehab potential.      If plan is discharge home, recommend the following:   Two people to help with walking and/or transfers;A lot of help with bathing/dressing/bathroom;Assistance with cooking/housework;Assistance with feeding;Direct supervision/assist for medications management;Direct supervision/assist for financial management;Assist for transportation;Help with stairs or ramp for entrance;Supervision due to cognitive status     Functional  Status Assessment   Patient has had a recent decline in their functional status and demonstrates the ability to make significant improvements in function in a reasonable and predictable amount of time.     Equipment Recommendations   BSC/3in1     Recommendations for Other Services         Precautions/Restrictions   Precautions Precautions: Fall;ICD/Pacemaker Precaution/Restrictions Comments: OT educated pt and his son in ICD/pacemaker precautions with both verbalizing understanding and son demonstraing understanding through teach back. Pt requiring Max cues to adhere ICD/pacemaker precautions during session Required Braces or Orthoses: Sling (L UE) Restrictions Other Position/Activity Restrictions: ICD/pacemaker     Mobility Bed Mobility Overal bed mobility: Needs Assistance Bed Mobility: Supine to Sit, Sit to Supine     Supine to sit: Mod assist, HOB elevated, Max assist Sit to supine: Max assist   General bed mobility comments: Pt requiring Max cues to adhere to ICD/pacemaker precautions throughout    Transfers Overall transfer level: Needs assistance Equipment used: Rolling walker (2 wheels), None Transfers: Sit to/from Stand Sit to Stand: Max assist           General transfer comment: Pt requiring Max assist of OT to minimally elevate buttocks from bed with bed at an elevated position and with pt unable to fully clear buttocks from bed. Attempts x2, once with RW and once without an AD. Pt requiring Mod to Max assist to scoot laterally at EOB. Pt requiring Max cues and with difficulty adhering to ICD/pacemaker precautions throughout.      Balance Overall balance assessment: Needs assistance, Mild deficits observed, not formally tested, History of Falls Sitting-balance support: Single extremity supported, Bilateral upper extremity supported, Feet supported Sitting balance-Leahy Scale: Fair Sitting balance - Comments: Initialy requiring CGA to maintain  balance  fading to close Sueprvision for safety                                   ADL either performed or assessed with clinical judgement   ADL Overall ADL's : Needs assistance/impaired Eating/Feeding: Set up;Supervision;Sitting (cues to adhere to ICD/pacemaker precautions)   Grooming: Contact guard assist;Minimal assistance;Cueing for compensatory techniques;Sitting (cues to adhere to ICD/pacemaker precautions)   Upper Body Bathing: Moderate assistance;Maximal assistance;Cueing for safety;Cueing for sequencing;Cueing for compensatory techniques;Sitting (cues to adhere to ICD/pacemaker precautions)   Lower Body Bathing: Maximal assistance;Bed level;Cueing for compensatory techniques;Cueing for safety;Cueing for sequencing (cues to adhere to ICD/pacemaker precautions)   Upper Body Dressing : Moderate assistance;Maximal assistance;Cueing for safety;Cueing for sequencing;Cueing for compensatory techniques;Sitting (cues to adhere to ICD/pacemaker precautions)   Lower Body Dressing: Maximal assistance;Bed level;Cueing for safety;Cueing for sequencing;Cueing for compensatory techniques (cues to adhere to ICD/pacemaker precautions)     Toilet Transfer Details (indicate cue type and reason): unable to progress to transfer this session Toileting- Clothing Manipulation and Hygiene: Total assistance;Bed level         General ADL Comments: Pt limited by decreased activity tolerance and impaired cogntion with difficulty adhering to ICD/pacemaker precautions     Vision Patient Visual Report: No change from baseline       Perception         Praxis         Pertinent Vitals/Pain Pain Assessment Pain Assessment: No/denies pain     Extremity/Trunk Assessment Upper Extremity Assessment Upper Extremity Assessment: Right hand dominant;RUE deficits/detail;LUE deficits/detail RUE Deficits / Details: generalized weakness; mildly impaired fine motor coordination, suspect at  baseline RUE Coordination: decreased fine motor LUE Deficits / Details: PROM/AAROM tested within ICD/pacemaker precautions WFL; generalized weakness; impaired fine motor coordinaiton, suspect at baseline; difficulty adhering to precautions LUE Coordination: decreased fine motor   Lower Extremity Assessment Lower Extremity Assessment: Defer to PT evaluation   Cervical / Trunk Assessment Cervical / Trunk Assessment: Kyphotic   Communication Communication Communication: No apparent difficulties   Cognition Arousal: Alert Behavior During Therapy: WFL for tasks assessed/performed Cognition: History of cognitive impairments, Cognition impaired   Orientation impairments: Place (Pt stated he was at [facility].) Awareness: Intellectual awareness intact, Online awareness impaired Memory impairment (select all impairments): Short-term memory, Working Civil [employer] fast streamer, Conservation officer, historic buildings Attention impairment (select first level of impairment): Selective attention Executive functioning impairment (select all impairments): Organization, Reasoning, Problem solving OT - Cognition Comments: Pt with difficulty adhering to ICD/pacemaker precautions throughout session with pt attempting to use Left UE during all tasks even though in a sling and with Max cues provided.                 Following commands: Impaired Following commands impaired: Follows one step commands inconsistently, Follows one step commands with increased time     Cueing  General Comments   Cueing Techniques: Verbal cues;Tactile cues;Visual cues  Pt HR in the upper-60s to 80s and O2 sat largely >/92% on RA with brief drop in O2 x2 to 88% and 90% on RA during bed mobility and during attmpt to stand with O2 sat recovering quickly with seated restbreak. Pt's son present during part of session.   Exercises     Shoulder Instructions      Home Living Family/patient expects to be discharged to:: Assisted living  Additional Comments: [facility], elevator access, walk-in shower with shower chair, RW, grab bars in shower and toilet, eats meals in dining room      Prior Functioning/Environment Prior Level of Function : Needs assist             Mobility Comments: Pt and son reported needing at least intermittent assist for bed mobility, transfer, and gait. Uses RW or rollator for mobility. Pt and son report a history of frequent falls. ADLs Comments: Pt reports completing ADLs with Mod I to Supervision and receives assist from staff or family for all IADLs. Pt is a retired MD.    OT Problem List: Decreased strength;Decreased activity tolerance;Impaired balance (sitting and/or standing);Decreased coordination;Decreased cognition;Decreased safety awareness;Decreased knowledge of use of DME or AE;Decreased knowledge of precautions   OT Treatment/Interventions: Self-care/ADL training;Therapeutic exercise;DME and/or AE instruction;Therapeutic activities;Cognitive remediation/compensation;Patient/family education;Balance training      OT Goals(Current goals can be found in the care plan section)   Acute Rehab OT Goals Patient Stated Goal: to return home OT Goal Formulation: With patient/family Time For Goal Achievement: 04/22/24 Potential to Achieve Goals: Good ADL Goals Pt Will Perform Grooming: with set-up;with supervision;sitting (adhering to ICD/pacemaker precautions) Pt Will Perform Upper Body Bathing: with min assist;sitting (adhering to ICD/pacemaker precautions) Pt Will Perform Upper Body Dressing: with min assist;sitting (adhering to ICD/pacemaker precautions) Pt Will Perform Lower Body Dressing: with mod assist;sit to/from stand (adhering to ICD/pacemaker precautions) Pt Will Transfer to Toilet: with min assist;ambulating;bedside commode (adhering to ICD/pacemaker precautions; with least restrictive AD) Additional ADL Goal #1: Patient will complete bed  mobility during/in preparation for funcitonal tasks with Contact guard assist while adhering to ICD/pacemaker precautions.   OT Frequency:  Min 2X/week    Co-evaluation              AM-PAC OT "6 Clicks" Daily Activity     Outcome Measure Help from another person eating meals?: A Little Help from another person taking care of personal grooming?: A Little Help from another person toileting, which includes using toliet, bedpan, or urinal?: Total Help from another person bathing (including washing, rinsing, drying)?: A Lot Help from another person to put on and taking off regular upper body clothing?: A Lot Help from another person to put on and taking off regular lower body clothing?: A Lot 6 Click Score: 13   End of Session    Activity Tolerance:   Patient left:    OT Visit Diagnosis: Unsteadiness on feet (R26.81);Other abnormalities of gait and mobility (R26.89);Repeated falls (R29.6);History of falling (Z91.81);Muscle weakness (generalized) (M62.81);Ataxia, unspecified (R27.0);Other symptoms and signs involving cognitive function                Time: 6045-4098 OT Time Calculation (min): 42 min Charges:  OT General Charges $OT Visit: 1 Visit OT Evaluation $OT Eval Moderate Complexity: 1 Mod OT Treatments $Self Care/Home Management : 8-22 mins $Therapeutic Activity: 8-22 mins  Vanshika Jastrzebski "Darral Ellis., OTR/L, MA Acute Rehab [phone]   Walt Gunner 04/08/2024, 5:53 PM

## 2024-04-08 NOTE — Discharge Summary (Signed)
 Physician Discharge Summary  Daniel Moses ZOX:[id number] DOB: 12-30-1933 DOA: 04/05/2024  PCP: Marguerite Shiley, MD  Admit date: 04/05/2024 Discharge date: 04/08/2024  Admitted From: Assisted living facility  Discharge disposition: Skilled nursing facility   Recommendations for Outpatient Follow-Up:   Follow up with your primary care provider at the skilled nursing facility in 3 to 5 days Check CBC, BMP, magnesium in the next visit Follow-up with electrophysiology cardiology as scheduled in clinic.  Discharge Diagnosis:   Principal Problem:   Flutter-fibrillation (HCC) Active Problems:   Hyperlipidemia   Primary hypertension   Unstable gait  Discharge Condition: Improved.  Diet recommendation: Low sodium, heart healthy.   Wound care: None.  Code status: Full.   History of Present Illness:    Daniel Moses is a 89 y.o. male with past medical history significant of mild cognitive impairment, BPH presented to the hospital after sustaining a fall on the floor without any loss of consciousness.  Has had falls in the past as well.  In the ED patient was noted to have irregular rhythm and EKG and was subsequently admitted to hospital for further evaluation and treatment.   Hospital Course:   Following conditions were addressed during hospitalization as listed below,  New onset Afib/flutter with bradycardia, variable AV conduction. Elevated BNP Demand ischemia   New onset of atrial fibrillation flutter with bradycardia..  CHA2DS2-VASc score 4.  Poor anticoagulation candidate as per cardiology due to falls and progressive dementia.  Cardiology followed the patient during hospitalization.    2D echocardiogram shows LV ejection fraction of 50 to 55% with LVH.  Patient euvolemic at this time.  Likely dyspnea from bradycardia.  Symptoms have improved.  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  At this  time patient has been seen by cardiology and okay for disposition.  Will need to take care of pacemaker precautions.   Essential hypertension Continue losartan, on discharge.   Hyperlipidemia Continue simvastatin.   BPH Continue finasteride and Flomax.   Generalized weakness,Physical decondioning, unstable gait Physical therapy has recommended skilled nursing facility placement on discharge.  Patient will transition to skilled nursing facility on discharge.    History of vascular dementia.  Resume home medications.  Disposition.  At this time, patient is stable for disposition to skilled nursing facility.  Spoke with the patient's son at bedside  Medical Consultants:   Cardiology Electrophysiology cardiology  Procedures:    Left chest wall permanent pacemaker placement on 04/07/2024 Subjective:   Today, patient was seen and examined at bedside. Patient denies any chest pain, dizziness, lightheadedness, shortness of breath, fever, chills or rigor.   Discharge Exam:   Vitals:   04/08/24 0750 04/08/24 1137  BP: (!) 166/92 129/68  Pulse: 74 65  Resp: 20   Temp: 98 F (36.7 C) 98.2 F (36.8 C)  SpO2: 97% 92%   Vitals:   04/08/24 0540 04/08/24 0600 04/08/24 0750 04/08/24 1137  BP: (!) 154/102  (!) 166/92 129/68  Pulse: 85 76 74 65  Resp: 16  20   Temp: 98 F (36.7 C)  98 F (36.7 C) 98.2 F (36.8 C)  TempSrc: Oral  Oral Oral  SpO2: 95% 92% 97% 92%  Weight:      Height:        GENERAL: Patient is alert awake and Communicative not in obvious distress.  Elderly male, Communicative, HENT: No scleral pallor or icterus. Pupils equally reactive to light. Oral mucosa  is moist NECK: is supple, no gross swelling noted. CHEST: Clear to auscultation. No crackles or wheezes.  Left chest wall pacer in place, sling on the left side CVS: S1 and S2 heard, no murmur.   ABDOMEN: Soft, non-tender, bowel sounds are present. EXTREMITIES: No edema. CNS: Cranial nerves are intact.  No focal motor deficits. SKIN: warm and dry without rashes.  The results of significant diagnostics from this hospitalization (including imaging, microbiology, ancillary and laboratory) are listed below for reference.     Diagnostic Studies:   ECHOCARDIOGRAM COMPLETE Result Date: 04/05/2024    ECHOCARDIOGRAM REPORT   Patient Name:   Daniel Moses Date of Exam: 04/05/2024 Medical Rec #:  [id number]    Height:       69.0 in Accession #:    [id number]   Weight:       200.0 lb Date of Birth:  [birth year]   BSA:          2.066 m Patient Age:    89 years     BP:           168/80 mmHg Patient Gender: M            HR:           40 bpm. Exam Location:  Inpatient Procedure: 2D Echo, Color Doppler, Cardiac Doppler and Intracardiac            Opacification Agent (Both Spectral and Color Flow Doppler were            utilized during procedure). Indications:    Atrial fibrillation and Flutter  History:        Patient has no prior history of Echocardiogram examinations.                 Risk Factors:Hypertension and Dyslipidemia.  Sonographer:    Sherline Distel Senior RDCS Referring Phys: [id number] Arne Langdon  Sonographer Comments: Very poor apical windows due to patient body habitus. IMPRESSIONS  1. Left ventricular ejection fraction, by estimation, is 50 to 55%. The left ventricle has low normal function. Left ventricular endocardial border not optimally defined to evaluate regional wall motion. There is mild concentric left ventricular hypertrophy. Left ventricular diastolic parameters are indeterminate.  2. Right ventricular systolic function is normal. The right ventricular size is mildly enlarged.  3. Left atrial size was severely dilated.  4. Right atrial size was severely dilated.  5. The mitral valve is normal in structure. Mild mitral valve regurgitation. No evidence of mitral stenosis.  6. Multiple jets. Tricuspid valve regurgitation is severe.  7. The aortic valve is tricuspid. There is moderate calcification of the aortic  valve. Aortic valve regurgitation is not visualized. Aortic valve sclerosis is present, with no evidence of aortic valve stenosis.  8. The inferior vena cava is dilated in size with <50% respiratory variability, suggesting right atrial pressure of 15 mmHg. FINDINGS  Left Ventricle: No Strain or 3D Transmitted. Left ventricular ejection fraction, by estimation, is 50 to 55%. The left ventricle has low normal function. Left ventricular endocardial border not optimally defined to evaluate regional wall motion. Definity contrast agent was given IV to delineate the left ventricular endocardial borders. Strain was performed and the global longitudinal strain is indeterminate. The left ventricular internal cavity size was normal in size. There is mild concentric left ventricular hypertrophy. Left ventricular diastolic parameters are indeterminate. Right Ventricle: The right ventricular size is mildly enlarged. No increase in right ventricular wall thickness. Right ventricular systolic function is normal.  Left Atrium: Left atrial size was severely dilated. Right Atrium: Right atrial size was severely dilated. Pericardium: There is no evidence of pericardial effusion. Mitral Valve: The mitral valve is normal in structure. Mild mitral valve regurgitation. No evidence of mitral valve stenosis. Tricuspid Valve: Multiple jets. The tricuspid valve is normal in structure. Tricuspid valve regurgitation is severe. Aortic Valve: The aortic valve is tricuspid. There is moderate calcification of the aortic valve. Aortic valve regurgitation is not visualized. Aortic valve sclerosis is present, with no evidence of aortic valve stenosis. Pulmonic Valve: The pulmonic valve was not well visualized. Pulmonic valve regurgitation is not visualized. No evidence of pulmonic stenosis. Aorta: The aortic root and ascending aorta are structurally normal, with no evidence of dilitation. Venous: The inferior vena cava is dilated in size with less than  50% respiratory variability, suggesting right atrial pressure of 15 mmHg. IAS/Shunts: No atrial level shunt detected by color flow Doppler. Additional Comments: 3D was performed not requiring image post processing on an independent workstation and was indeterminate.  LEFT VENTRICLE PLAX 2D LVIDd:         5.00 cm LVIDs:         3.60 cm LV PW:         1.10 cm LV IVS:        1.00 cm LVOT diam:     2.20 cm LV SV:         71 LV SV Index:   35 LVOT Area:     3.80 cm  RIGHT VENTRICLE RV S prime:     10.20 cm/s TAPSE (M-mode): 1.8 cm LEFT ATRIUM              Index        RIGHT ATRIUM           Index LA diam:        4.70 cm  2.27 cm/m   RA Area:     34.80 cm LA Vol (A2C):   115.0 ml 55.66 ml/m  RA Volume:   129.00 ml 62.44 ml/m LA Vol (A4C):   99.2 ml  48.02 ml/m LA Biplane Vol: 110.0 ml 53.24 ml/m  AORTIC VALVE LVOT Vmax:   88.60 cm/s LVOT Vmean:  61.200 cm/s LVOT VTI:    0.188 m  AORTA Ao Root diam: 3.20 cm Ao Asc diam:  3.50 cm TRICUSPID VALVE TR Peak grad:   40.7 mmHg TR Vmax:        319.00 cm/s  SHUNTS Systemic VTI:  0.19 m Systemic Diam: 2.20 cm Gloriann Larger MD Electronically signed by Gloriann Larger MD Signature Date/Time: 04/05/2024/6:11:16 PM    Final    CT CHEST WO CONTRAST Result Date: 04/05/2024 CLINICAL DATA:  Lung nodule EXAM: CT CHEST WITHOUT CONTRAST TECHNIQUE: Multidetector CT imaging of the chest was performed following the standard protocol without IV contrast. RADIATION DOSE REDUCTION: This exam was performed according to the departmental dose-optimization program which includes automated exposure control, adjustment of the mA and/or kV according to patient size and/or use of iterative reconstruction technique. COMPARISON:  CT abdomen and pelvis Mar 30, 2024 FINDINGS: Cardiovascular: No significant vascular findings. Normal heart size. No pericardial effusion. Extensive coronary artery calcifications Mediastinum/Nodes: Large nodular adenopathy along the prevascular aortic space  measuring 15 x 20 mm, retrocaval pretracheal adenopathy measuring 11 x 15 mm. Lungs/Pleura: Prominence of the interstitial markings bilaterally could correlate with chronic interstitial lung disease with mild centrilobular emphysema and some atelectatic changes along the right and left mid lung  and left lingular region. No obvious pulmonary nodules. Upper Abdomen: Questionable low-attenuation area within the right lobe of the liver on image number 57 correlates with the previously seen lesions on the prior CT of the abdomen refer to prior dictation. Musculoskeletal: No chest wall mass or suspicious bone lesions identified. IMPRESSION: *Prominence of the interstitial markings bilaterally could correlate with chronic interstitial lung disease with mild centrilobular emphysema and some atelectatic changes along the right and left mid lung and left lingular region. *Large nodular adenopathy along the prevascular aortic space measuring 15 x 20 mm, retrocaval pretracheal adenopathy measuring 11 x 15 mm. *Questionable low-attenuation area within the right lobe of the liver on image number 57 correlates with the previously seen lesions on the prior CT of the abdomen refer to prior dictation. Electronically Signed   By: Fredrich Jefferson M.D.   On: 04/05/2024 07:59   DG Chest Portable 1 View Result Date: 04/05/2024 CLINICAL DATA:  Bradycardia. EXAM: PORTABLE CHEST 1 VIEW COMPARISON:  None Available. FINDINGS: Low volume film. The cardio pericardial silhouette is enlarged. Streaky density at the left base suggest atelectasis. Two view study. Circular density identified in the right apex, potentially related to the anterior first rib end. No acute bony abnormality. Telemetry leads overlie the chest. IMPRESSION: 1. Low volume film with streaky density at the left base suggesting atelectasis. 2. Circular density in the right apex, potentially related to the anterior first rib end. CT chest without contrast recommended to exclude  pulmonary parenchymal lesion. Electronically Signed   By: Donnal Fusi M.D.   On: 04/05/2024 06:24     Labs:   Basic Metabolic Panel: Recent Labs  Lab 04/05/24 0549 04/05/24 1300 04/07/24 0539  NA 140  --  138  K 4.3  --  4.1  CL 109  --  102  CO2 23  --  25  GLUCOSE 127*  --  121*  BUN 17  --  15  CREATININE 1.06 0.97 1.02  CALCIUM 9.0  --  8.9  MG 2.2  --  2.2  PHOS  --   --  3.4   GFR Estimated Creatinine Clearance: 56.8 mL/min (by C-G formula based on SCr of 1.02 mg/dL). Liver Function Tests: No results for input(s): "AST", "ALT", "ALKPHOS", "BILITOT", "PROT", "ALBUMIN" in the last 168 hours. No results for input(s): "LIPASE", "AMYLASE" in the last 168 hours. No results for input(s): "AMMONIA" in the last 168 hours. Coagulation profile No results for input(s): "INR", "PROTIME" in the last 168 hours.  CBC: Recent Labs  Lab 04/05/24 0549 04/05/24 1300 04/07/24 0539  WBC 10.3 9.0 11.2*  HGB 14.1 14.0 15.9  HCT 43.5 43.9 47.4  MCV 96.0 96.7 92.9  PLT 206 208 236   Cardiac Enzymes: No results for input(s): "CKTOTAL", "CKMB", "CKMBINDEX", "TROPONINI" in the last 168 hours. BNP: Invalid input(s): "POCBNP" CBG: No results for input(s): "GLUCAP" in the last 168 hours. D-Dimer No results for input(s): "DDIMER" in the last 72 hours. Hgb A1c No results for input(s): "HGBA1C" in the last 72 hours. Lipid Profile No results for input(s): "CHOL", "HDL", "LDLCALC", "TRIG", "CHOLHDL", "LDLDIRECT" in the last 72 hours. Thyroid  function studies Recent Labs    04/05/24 1300  TSH 4.478   Anemia work up No results for input(s): "VITAMINB12", "FOLATE", "FERRITIN", "TIBC", "IRON", "RETICCTPCT" in the last 72 hours. Microbiology Recent Results (from the past 240 hours)  Surgical PCR screen     Status: None   Collection Time: 04/06/24  7:38 PM  Specimen: Nasal Mucosa; Nasal Swab  Result Value Ref Range Status   MRSA, PCR NEGATIVE NEGATIVE Final   Staphylococcus  aureus NEGATIVE NEGATIVE Final    Comment: (NOTE) The Xpert SA Assay (FDA approved for NASAL specimens in patients 29 years of age and older), is one component of a comprehensive surveillance program. It is not intended to diagnose infection nor to guide or monitor treatment. Performed at [facility], [address], [place], [place] [address]      Discharge Instructions:   Discharge Instructions     Amb referral to AFIB Clinic   Complete by: As directed    Call MD for:  redness, tenderness, or signs of infection (pain, swelling, redness, odor or green/yellow discharge around incision site)   Complete by: As directed    Call MD for:  severe uncontrolled pain   Complete by: As directed    Diet - low sodium heart healthy   Complete by: As directed    Discharge instructions   Complete by: As directed    Follow-up with cardiology as scheduled by the clinic.  Take precautions at the pacemaker site.  Seek medical attention for worsening symptoms.   Increase activity slowly   Complete by: As directed       Allergies as of 04/08/2024       Reactions   Atorvastatin Other (See Comments)   Myalgia        Medication List     TAKE these medications    acetaminophen 500 MG tablet Commonly known as: TYLENOL Take 1,000 mg by mouth 2 (two) times daily. May take one additional 1000mg  dose as needed for pain   Centrum Silver Chew Chew 1 tablet by mouth in the morning.   diclofenac  Sodium 1 % Gel Commonly known as: Voltaren  Apply 4 g topically in the morning, at noon, and at bedtime.   ezetimibe 10 MG tablet Commonly known as: ZETIA Take 10 mg by mouth daily.   finasteride 5 MG tablet Commonly known as: PROSCAR Take 5 mg by mouth in the morning.   fluticasone 50 MCG/ACT nasal spray Commonly known as: FLONASE Place 1 spray into both nostrils 2 (two) times daily as needed for allergies or rhinitis.   galantamine 8 MG 24 hr capsule Commonly known as: RAZADYNE  ER Take 8 mg by mouth daily with breakfast.   losartan 25 MG tablet Commonly known as: COZAAR Take 25 mg by mouth every morning.   memantine  5 MG tablet Commonly known as: NAMENDA  Take 1 tablet (5 mg total) by mouth 2 (two) times daily.   simvastatin 40 MG tablet Commonly known as: ZOCOR Take 40 mg by mouth every evening.   tamsulosin 0.4 MG Caps capsule Commonly known as: FLOMAX Take 0.4 mg by mouth daily.           Time coordinating discharge: 39 minutes  Signed:  Lajuana Patchell  [facility] 04/08/2024, 12:37 PM

## 2024-04-08 NOTE — Plan of Care (Signed)
 Discussed with patient in front of son plan of care for the evening, pain management and rash on back and right lower hip with some teach back displayed by the family.    Problem: Education: Goal: Knowledge of General Education information will improve Description: Including pain rating scale, medication(s)/side effects and non-pharmacologic comfort measures Outcome: Progressing   Problem: Health Behavior/Discharge Planning: Goal: Ability to manage health-related needs will improve Outcome: Not Progressing   Problem: Pain Managment: Goal: General experience of comfort will improve and/or be controlled Outcome: Not Progressing

## 2024-04-08 NOTE — TOC Transition Note (Addendum)
 Transition of Care [facility]) - Discharge Note   Patient Details  Name: Daniel Moses MRN: [id number] Date of Birth: [birth year]  [facility]) CM/SW Contact:  Carmon Christen, LCSWA Phone Number: 04/08/2024, 12:53 PM   Clinical Narrative:     Patient will DC to: [facility]   Anticipated DC date: 04/08/2024  Family notified: Polly Brink   Transport by: Lyna Sandhoff   ?  Per MD patient ready for DC to [facility] . RN, patient, patient's family, and facility notified of DC. Discharge Summary sent to facility. RN given number for report [phone] RM# [phone]. DC packet on chart. Ambulance transport requested for patient.  CSW signing off.   Final next level of care: Skilled Nursing Facility Barriers to Discharge: No Barriers Identified   Patient Goals and CMS Choice Patient states their goals for this hospitalization and ongoing recovery are:: SNF   Choice offered to / list presented to : Patient, Adult Children      Discharge Placement              Patient chooses bed at: [facility] Patient to be transferred to facility by: [facility] Name of family member notified: Polly Brink Patient and family notified of of transfer: 04/08/24  Discharge Plan and Services Additional resources added to the After Visit Summary for   In-house Referral: Clinical Social Work                                   Social Drivers of Health (SDOH) Interventions SDOH Screenings   Food Insecurity: No Food Insecurity (04/06/2024)  Housing: Low Risk  (04/06/2024)  Transportation Needs: No Transportation Needs (04/06/2024)  Utilities: Not At Risk (04/06/2024)  Depression (PHQ2-9): Low Risk  (02/23/2024)  Social Connections: Moderately Isolated (04/06/2024)  Tobacco Use: High Risk (04/05/2024)     Readmission Risk Interventions     No data to display

## 2024-04-08 NOTE — TOC Progression Note (Addendum)
 Transition of Care [facility]) - Progression Note    Patient Details  Name: Daniel Moses MRN: [id number] Date of Birth: Feb 19, 1934  [facility]) CM/SW Contact  Carmon Christen, LCSWA Phone Number: 04/08/2024, 10:05 AM  Clinical Narrative:     Patients passr pending. Patient has SNF bed at [facility] CSW will continue to follow.  Update- CSW spoke with [place] with [facility] who informed CSW that no passr needed for patient to dc over since self pay. CSW informed MD. CSW will continue to follow.  Expected Discharge Plan: Skilled Nursing Facility Barriers to Discharge: Continued Medical Work up  Expected Discharge Plan and Services In-house Referral: Clinical Social Work     Living arrangements for the past 2 months: Assisted Living Facility                                       Social Determinants of Health (SDOH) Interventions SDOH Screenings   Food Insecurity: No Food Insecurity (04/06/2024)  Housing: Low Risk  (04/06/2024)  Transportation Needs: No Transportation Needs (04/06/2024)  Utilities: Not At Risk (04/06/2024)  Depression (PHQ2-9): Low Risk  (02/23/2024)  Social Connections: Moderately Isolated (04/06/2024)  Tobacco Use: High Risk (04/05/2024)    Readmission Risk Interventions     No data to display

## 2024-04-08 NOTE — Progress Notes (Signed)
 Attempted  to call report to [facility] at  [phone] x [phone] attempts.  After 1 min of ringing the message notes: "unable to complete call at this time, please call back again later."

## 2024-04-08 NOTE — Progress Notes (Signed)
 Rounding Note    Patient Name: Daniel Moses Date of Encounter: 04/08/2024  [facility] Cardiologist: Arun K Thukkani, MD   Subjective   Feels well this morning, more clear, with some AMS post procedure last night.  No CP, SOB Mild ache at implant site  Inpatient Medications    Scheduled Meds:  enoxaparin (LOVENOX) injection  40 mg Subcutaneous Q24H   ezetimibe  10 mg Oral Daily   finasteride  5 mg Oral Daily   [START ON 04/09/2024] galantamine  8 mg Oral Q breakfast   losartan  25 mg Oral q morning   memantine   5 mg Oral BID   multivitamin with minerals  1 tablet Oral q AM   mupirocin ointment  1 Application Nasal BID   simvastatin  40 mg Oral QPM   sodium chloride flush  3 mL Intravenous Q12H   tamsulosin  0.4 mg Oral Daily   Continuous Infusions:   PRN Meds: acetaminophen, fluticasone, hydrALAZINE   Vital Signs    Vitals:   04/08/24 0540 04/08/24 0600 04/08/24 0750 04/08/24 1137  BP: (!) 154/102  (!) 166/92 129/68  Pulse: 85 76 74 65  Resp: 16  20   Temp: 98 F (36.7 C)  98 F (36.7 C) 98.2 F (36.8 C)  TempSrc: Oral  Oral Oral  SpO2: 95% 92% 97% 92%  Weight:      Height:        Intake/Output Summary (Last 24 hours) at 04/08/2024 1151 Last data filed at 04/08/2024 0557 Gross per 24 hour  Intake 230 ml  Output 450 ml  Net -220 ml      04/05/2024    5:43 AM 02/23/2024   11:35 AM 01/19/2024    3:32 PM  Last 3 Weights  Weight (lbs) 200 lb 204 lb 12.8 oz 199 lb  Weight (kg) 90.719 kg 92.897 kg 90.266 kg      Telemetry    SR/VP, AV paced - Personally Reviewed  ECG    SR/VP, AV paced beats - Personally Reviewed  Physical Exam   GEN: No acute distress.   Neck: No JVD Cardiac: RRR, no murmurs, rubs, or gallops.  Respiratory: CTA b/l GI: Soft, nontender, non-distended  MS: No edema; No deformity. Neuro:  Nonfocal  Psych: Normal affect   L chest: site is stable, no bleeding, hematoma  Labs    High Sensitivity Troponin:   Recent  Labs  Lab 04/05/24 0549 04/05/24 1300  TROPONINIHS 48* 52*     Chemistry Recent Labs  Lab 04/05/24 0549 04/05/24 1300 04/07/24 0539  NA 140  --  138  K 4.3  --  4.1  CL 109  --  102  CO2 23  --  25  GLUCOSE 127*  --  121*  BUN 17  --  15  CREATININE 1.06 0.97 1.02  CALCIUM 9.0  --  8.9  MG 2.2  --  2.2  GFRNONAA >60 >60 >60  ANIONGAP 8  --  11    Lipids No results for input(s): "CHOL", "TRIG", "HDL", "LABVLDL", "LDLCALC", "CHOLHDL" in the last 168 hours.  Hematology Recent Labs  Lab 04/05/24 0549 04/05/24 1300 04/07/24 0539  WBC 10.3 9.0 11.2*  RBC 4.53 4.54 5.10  HGB 14.1 14.0 15.9  HCT 43.5 43.9 47.4  MCV 96.0 96.7 92.9  MCH 31.1 30.8 31.2  MCHC 32.4 31.9 33.5  RDW 14.3 14.1 13.9  PLT 206 208 236   Thyroid   Recent Labs  Lab  04/05/24 1300  TSH 4.478    BNP Recent Labs  Lab 04/05/24 0550  BNP 829.1*    DDimer No results for input(s): "DDIMER" in the last 168 hours.   Radiology      Cardiac Studies   04/05/24: TTE 1. Left ventricular ejection fraction, by estimation, is 50 to 55%. The  left ventricle has low normal function. Left ventricular endocardial  border not optimally defined to evaluate regional wall motion. There is  mild concentric left ventricular  hypertrophy. Left ventricular diastolic parameters are indeterminate.   2. Right ventricular systolic function is normal. The right ventricular  size is mildly enlarged.   3. Left atrial size was severely dilated.   4. Right atrial size was severely dilated.   5. The mitral valve is normal in structure. Mild mitral valve  regurgitation. No evidence of mitral stenosis.   6. Multiple jets. Tricuspid valve regurgitation is severe.   7. The aortic valve is tricuspid. There is moderate calcification of the  aortic valve. Aortic valve regurgitation is not visualized. Aortic valve  sclerosis is present, with no evidence of aortic valve stenosis.   8. The inferior vena cava is dilated in size  with <50% respiratory  variability, suggesting right atrial pressure of 15 mmHg.   Patient Profile     89 y.o. 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 AFlutter Not known previously No historical EKGs in Epic or Care everywhere Unknown onset Not felt to be an a/c candidate with his falls (daughter is in full agreement) Perhaps if with pacing falls stop > could be revisited  He has had spontaneous CV to SR post procedure   3. Falls Unclear mechanisms, poor historian Known to have bad knees   Dr. Marven Slimmer has seen the patient OK to discharge from our perspective, when medically ready otherwise We will sign off though remain available Please recall if needed   For questions or updates, please contact [facility] Please consult [email or web address] for contact info under        Signed, Debbie Fails, PA-C  04/08/2024, 11:51 AM

## 2024-04-08 NOTE — Progress Notes (Addendum)
 PROGRESS NOTE  Daniel Moses UJW:[id number] DOB: Jul 19, 1934 DOA: 04/05/2024 PCP: Marguerite Shiley, MD   LOS: 3 days   Brief narrative:  Daniel Moses is a 89 y.o. male with past medical history significant of mild cognitive impairment, BPH presented to the hospital after sustaining a fall on the floor without any loss of consciousness.  Has had falls in the past as well.  In the ED patient was noted to have irregular rhythm and EKG and was subsequently admitted to hospital for further evaluation and treatment.   Assessment/Plan: Principal Problem:   Flutter-fibrillation (HCC) Active Problems:   Hyperlipidemia   Primary hypertension   Unstable gait  New onset Afib/flutter with bradycardia, variable AV conduction. Elevated BNP Demand ischemia Continue telemetry monitoring.  New onset of atrial fibrillation flutter with bradycardia..  CHA2DS2-VASc score 4.  Poor anticoagulation candidate as per cardiology due to falls and progressive dementia.  Cardiology on board.    2D echocardiogram shows LV ejection fraction of 50 to 55% with LVH.  Patient euvolemic at this time.  Likely dyspnea from bradycardia.  Electrophysiology Cardiology has seen the patient and patient has undergone permanent pacemaker placement on 04/07/2024  Essential hypertension Continue losartan, hydralazine. blood pressure seems to be elevated.    Hyperlipidemia Continue statins   BPH Continue finasteride and Flomax.  Generalized weakness,Physical decondioning, unstable gait Physical therapy has recommended skilled nursing facility placement on discharge.  TOC on board.  Patient currently at assisted living facility level of care.  History of vascular dementia. Currently at the skilled nursing facility.  Continue supportive care.  Delirium precautions.  Resume home medications.  DVT prophylaxis: enoxaparin (LOVENOX) injection 40 mg Start: 04/05/24 1200   Disposition: Skilled nursing facility as per PT evaluation.   Medically stable for disposition.  Status is: Inpatient  Remains inpatient appropriate because: A flutter with bradycardia, need for rehabilitation, EP cardiology recommendation of permanent pacemaker  Code Status:     Code Status: Full Code  Family Communication: Spoke  with the patient's son at bedside 04/08/2024  Consultants: Cardiology  Procedures: Left chest wall permanent pacemaker placement on 04/08/2019.  Anti-infectives:  None  Anti-infectives (From admission, onward)    Start     Dose/Rate Route Frequency Ordered Stop   04/06/24 2030  gentamicin (GARAMYCIN) 80 mg in sodium chloride 0.9 % 500 mL irrigation        80 mg Irrigation On call 04/06/24 1937 04/07/24 1610   04/06/24 2030  ceFAZolin (ANCEF) IVPB 2g/100 mL premix        2 g 200 mL/hr over 30 Minutes Intravenous On call 04/06/24 1937 04/07/24 1602      Subjective: Today, patient was seen and examined at bedside.  Patient denies any chest pain, dizziness, lightheadedness, shortness of breath, fever, chills or rigor. Objective: Vitals:   04/08/24 0600 04/08/24 0750  BP:  (!) 166/92  Pulse: 76 74  Resp:  20  Temp:  98 F (36.7 C)  SpO2: 92% 97%    Intake/Output Summary (Last 24 hours) at 04/08/2024 1100 Last data filed at 04/08/2024 0557 Gross per 24 hour  Intake 230 ml  Output 1250 ml  Net -1020 ml   Filed Weights   04/05/24 0543  Weight: 90.7 kg   Body mass index is 29.53 kg/m.   Physical Exam: GENERAL: Patient is alert awake and Communicative not in obvious distress.  Elderly male, Communicative, HENT: No scleral pallor or icterus. Pupils equally reactive to light. Oral mucosa is moist  NECK: is supple, no gross swelling noted. CHEST: Clear to auscultation. No crackles or wheezes.   CVS: S1 and S2 heard, no murmur.  Left chest wall pacer in place, sling on the left side ABDOMEN: Soft, non-tender, bowel sounds are present. EXTREMITIES: No edema. CNS: Cranial nerves are intact. No focal  motor deficits. SKIN: warm and dry without rashes.  Data Review: I have personally reviewed the following laboratory data and studies,  CBC: Recent Labs  Lab 04/05/24 0549 04/05/24 1300 04/07/24 0539  WBC 10.3 9.0 11.2*  HGB 14.1 14.0 15.9  HCT 43.5 43.9 47.4  MCV 96.0 96.7 92.9  PLT 206 208 236   Basic Metabolic Panel: Recent Labs  Lab 04/05/24 0549 04/05/24 1300 04/07/24 0539  NA 140  --  138  K 4.3  --  4.1  CL 109  --  102  CO2 23  --  25  GLUCOSE 127*  --  121*  BUN 17  --  15  CREATININE 1.06 0.97 1.02  CALCIUM 9.0  --  8.9  MG 2.2  --  2.2  PHOS  --   --  3.4   Liver Function Tests: No results for input(s): "AST", "ALT", "ALKPHOS", "BILITOT", "PROT", "ALBUMIN" in the last 168 hours. No results for input(s): "LIPASE", "AMYLASE" in the last 168 hours. No results for input(s): "AMMONIA" in the last 168 hours. Cardiac Enzymes: No results for input(s): "CKTOTAL", "CKMB", "CKMBINDEX", "TROPONINI" in the last 168 hours. BNP (last 3 results) Recent Labs    04/05/24 0550  BNP 829.1*    ProBNP (last 3 results) No results for input(s): "PROBNP" in the last 8760 hours.  CBG: No results for input(s): "GLUCAP" in the last 168 hours. Recent Results (from the past 240 hours)  Surgical PCR screen     Status: None   Collection Time: 04/06/24  7:38 PM   Specimen: Nasal Mucosa; Nasal Swab  Result Value Ref Range Status   MRSA, PCR NEGATIVE NEGATIVE Final   Staphylococcus aureus NEGATIVE NEGATIVE Final    Comment: (NOTE) The Xpert SA Assay (FDA approved for NASAL specimens in patients 62 years of age and older), is one component of a comprehensive surveillance program. It is not intended to diagnose infection nor to guide or monitor treatment. Performed at [facility], [address] [place], [place] [address]      Studies: DG CHEST PORT 1 VIEW Result Date: 04/08/2024 CLINICAL DATA:  Pacemaker placement. EXAM: PORTABLE CHEST 1 VIEW COMPARISON:   04/05/2024 FINDINGS: The cardio pericardial silhouette is enlarged. Low volume film without overt edema or focal consolidation. No evidence for pneumothorax or pleural effusion. Left-sided dual lead permanent pacemaker is new in the interval. Telemetry leads overlie the chest. IMPRESSION: Interval placement of left-sided dual lead permanent pacemaker without pneumothorax. Electronically Signed   By: Donnal Fusi M.D.   On: 04/08/2024 09:04   EP PPM/ICD IMPLANT Result Date: 04/07/2024  CONCLUSIONS:  1.  Symptomatic bradycardia  2.  Successful dual-chamber permanent pacemaker with left bundle area lead  3.  No early apparent complications.  4.  Hold anticoagulation for 5 days (okay to restart Apr 13, 2024)      Rosena Conradi, MD  [facility] 04/08/2024  If 7PM-7AM, please contact night-coverage

## 2024-04-11 ENCOUNTER — Non-Acute Institutional Stay (SKILLED_NURSING_FACILITY): Payer: Self-pay | Admitting: Internal Medicine

## 2024-04-11 DIAGNOSIS — R2689 Other abnormalities of gait and mobility: Secondary | ICD-10-CM | POA: Diagnosis not present

## 2024-04-11 DIAGNOSIS — G309 Alzheimer's disease, unspecified: Secondary | ICD-10-CM

## 2024-04-11 DIAGNOSIS — R3914 Feeling of incomplete bladder emptying: Secondary | ICD-10-CM

## 2024-04-11 DIAGNOSIS — Z950 Presence of cardiac pacemaker: Secondary | ICD-10-CM

## 2024-04-11 DIAGNOSIS — R41841 Cognitive communication deficit: Secondary | ICD-10-CM | POA: Diagnosis not present

## 2024-04-11 DIAGNOSIS — R296 Repeated falls: Secondary | ICD-10-CM | POA: Diagnosis not present

## 2024-04-11 DIAGNOSIS — Z9181 History of falling: Secondary | ICD-10-CM | POA: Diagnosis not present

## 2024-04-11 DIAGNOSIS — M62561 Muscle wasting and atrophy, not elsewhere classified, right lower leg: Secondary | ICD-10-CM | POA: Diagnosis not present

## 2024-04-11 DIAGNOSIS — F01A0 Vascular dementia, mild, without behavioral disturbance, psychotic disturbance, mood disturbance, and anxiety: Secondary | ICD-10-CM | POA: Diagnosis not present

## 2024-04-11 DIAGNOSIS — F0280 Dementia in other diseases classified elsewhere without behavioral disturbance: Secondary | ICD-10-CM

## 2024-04-11 DIAGNOSIS — I10 Essential (primary) hypertension: Secondary | ICD-10-CM

## 2024-04-11 DIAGNOSIS — R21 Rash and other nonspecific skin eruption: Secondary | ICD-10-CM

## 2024-04-11 DIAGNOSIS — E782 Mixed hyperlipidemia: Secondary | ICD-10-CM | POA: Diagnosis not present

## 2024-04-11 DIAGNOSIS — I2481 Acute coronary microvascular dysfunction: Secondary | ICD-10-CM | POA: Diagnosis not present

## 2024-04-11 DIAGNOSIS — M6289 Other specified disorders of muscle: Secondary | ICD-10-CM | POA: Diagnosis not present

## 2024-04-11 DIAGNOSIS — I483 Typical atrial flutter: Secondary | ICD-10-CM | POA: Diagnosis not present

## 2024-04-11 DIAGNOSIS — F0150 Vascular dementia without behavioral disturbance: Secondary | ICD-10-CM

## 2024-04-11 DIAGNOSIS — N401 Enlarged prostate with lower urinary tract symptoms: Secondary | ICD-10-CM | POA: Diagnosis not present

## 2024-04-11 DIAGNOSIS — M62562 Muscle wasting and atrophy, not elsewhere classified, left lower leg: Secondary | ICD-10-CM | POA: Diagnosis not present

## 2024-04-11 NOTE — Progress Notes (Signed)
 Provider:   Location:  Medical illustrator of Service:  SNF (31)  PCP: Marguerite Shiley, MD Patient Care Team: Marguerite Shiley, MD as PCP - General (Internal Medicine) Thukkani, Arun K, MD as PCP - Cardiology (Cardiology)  Extended Emergency Contact Information Primary Emergency Contact: Mccartin,BETTY A Address: [address]          [place], [place] [address] [place] Home Phone: [phone] Relation: Spouse Secondary Emergency Contact: Brue,Cindy Mobile Phone: [phone] Relation: Daughter  Code Status:  Goals of Care: Advanced Directive information    04/05/2024    6:05 PM  Advanced Directives  Does Patient Have a Medical Advance Directive? No  Would patient like information on creating a medical advance directive? No - Patient declined      Chief Complaint  Patient presents with   New Admit To SNF    HPI: Patient is a 89 y.o. male seen today for admission to Rehab  He was admitted in the hospital from 5/13-5/16 for Bradycardia and Falls  Patient lives in [place] with his wife who has Dementia  He fell in his room and was found to be bradycardic of 40. EKG showed A flutter Slow Possible Symptomatic Bradycardic Underwent PPM placement on 5/15 Echo showed LVH with Right and Left atrial Dilatation  No Anticoagulation due to Falls  Now he is in Rehab for therapy He is still needing help with his ADLS  His acute issue today was Rash with itching on his back and Front of his chest He says he developed rash few months ago    Patient is a retired Sports administrator Has a history of hypertension, hyperlipidemia, BPH, urinary incontinence and osteoarthritis  Recent Memory Loss Work up per Neurology  MRI negative for any acute Moderate chronic microvascular ischemic disease.  Cerebral Atrophy  Urinary Incontinence  Past Medical History:  Diagnosis Date   Arthritis    back and knees   Benign prostatic hyperplasia with incomplete  bladder emptying 06/15/2023   Hyperlipidemia 06/15/2023   Mild cognitive impairment 06/15/2023   Primary hypertension 06/15/2023   Primary osteoarthritis of both knees 06/15/2023   Varicose veins    Varicose veins of bilateral lower extremities with other complications 08/03/2013   IMO SNOMED Dx Update Oct 2024     Past Surgical History:  Procedure Laterality Date   INNER EAR SURGERY     PACEMAKER IMPLANT N/A 04/07/2024   Procedure: PACEMAKER IMPLANT;  Surgeon: Boyce Byes, MD;  Location: [facility];  Service: Cardiovascular;  Laterality: N/A;   SQUAMOUS CELL CARCINOMA EXCISION     on chest    reports that he has been smoking cigars. He has never used smokeless tobacco. He reports that he does not drink alcohol  and does not use drugs. Social History   Socioeconomic History   Marital status: Married    Spouse name: Not on file   Number of children: Not on file   Years of education: Not on file   Highest education level: Not on file  Occupational History   Not on file  Tobacco Use   Smoking status: Some Days    Types: Cigars   Smokeless tobacco: Never  Substance and Sexual Activity   Alcohol  use: No   Drug use: No   Sexual activity: Not on file  Other Topics Concern   Not on file  Social History Narrative   Not on file   Social Drivers of Health   Financial  Resource Strain: Not on file  Food Insecurity: No Food Insecurity (04/06/2024)   Hunger Vital Sign    Worried About Running Out of Food in the Last Year: Never true    Ran Out of Food in the Last Year: Never true  Transportation Needs: No Transportation Needs (04/06/2024)   PRAPARE - Administrator, Civil Service (Medical): No    Lack of Transportation (Non-Medical): No  Physical Activity: Not on file  Stress: Not on file  Social Connections: Moderately Isolated (04/06/2024)   Social Connection and Isolation Panel [NHANES]    Frequency of Communication with Friends and Family: More than  three times a week    Frequency of Social Gatherings with Friends and Family: More than three times a week    Attends Religious Services: Never    Database administrator or Organizations: No    Attends Banker Meetings: Never    Marital Status: Married  Catering manager Violence: Not At Risk (04/06/2024)   Humiliation, Afraid, Rape, and Kick questionnaire    Fear of Current or Ex-Partner: No    Emotionally Abused: No    Physically Abused: No    Sexually Abused: No    Functional Status Survey:    Family History  Problem Relation Age of Onset   Hypertension Mother    Other Mother        varicose veins   Cancer Father     Health Maintenance  Topic Date Due   Medicare Annual Wellness (AWV)  01/06/2024   COVID-19 Vaccine (9 - 2024-25 season) 04/22/2024 (Originally 11/10/2023)   DTaP/Tdap/Td (4 - Td or Tdap) 02/22/2025 (Originally 03/28/2020)   INFLUENZA VACCINE  06/24/2024   Pneumonia Vaccine 49+ Years old  Completed   Zoster Vaccines- Shingrix  Completed   HPV VACCINES  Aged Out   Meningococcal B Vaccine  Aged Out    Allergies  Allergen Reactions   Atorvastatin Other (See Comments)    Myalgia    Outpatient Encounter Medications as of 04/11/2024  Medication Sig   acetaminophen  (TYLENOL ) 500 MG tablet Take 1,000 mg by mouth 2 (two) times daily. May take one additional 1000mg  dose as needed for pain   diclofenac  Sodium (VOLTAREN ) 1 % GEL Apply 4 g topically in the morning, at noon, and at bedtime.   ezetimibe  (ZETIA ) 10 MG tablet Take 10 mg by mouth daily.   finasteride  (PROSCAR ) 5 MG tablet Take 5 mg by mouth in the morning.   fluticasone  (FLONASE ) 50 MCG/ACT nasal spray Place 1 spray into both nostrils 2 (two) times daily as needed for allergies or rhinitis.   galantamine  (RAZADYNE  ER) 8 MG 24 hr capsule Take 8 mg by mouth daily with breakfast.   losartan  (COZAAR ) 25 MG tablet Take 25 mg by mouth every morning.   memantine  (NAMENDA ) 5 MG tablet Take 1 tablet (5  mg total) by mouth 2 (two) times daily.   Multiple Vitamins-Minerals (CENTRUM SILVER) CHEW Chew 1 tablet by mouth in the morning.   simvastatin  (ZOCOR ) 40 MG tablet Take 40 mg by mouth every evening.   tamsulosin  (FLOMAX ) 0.4 MG CAPS capsule Take 0.4 mg by mouth daily.   No facility-administered encounter medications on file as of 04/11/2024.    Review of Systems  Constitutional:  Negative for activity change, appetite change and unexpected weight change.  HENT: Negative.    Respiratory:  Negative for cough and shortness of breath.   Cardiovascular:  Negative for leg swelling.  Gastrointestinal:  Negative for constipation.  Genitourinary:  Positive for frequency and urgency.  Musculoskeletal:  Positive for gait problem. Negative for arthralgias and myalgias.  Skin:  Positive for rash.  Neurological:  Negative for dizziness and weakness.  Psychiatric/Behavioral:  Negative for confusion and sleep disturbance.   All other systems reviewed and are negative.   Vitals:   04/11/24 1941  BP: 128/75  Pulse: 76  Resp: 16  Temp: 98.3 F (36.8 C)  Weight: 194 lb (88 kg)   Body mass index is 28.65 kg/m. Physical Exam Vitals reviewed.  Constitutional:      Appearance: Normal appearance.  HENT:     Head: Normocephalic.     Nose: Nose normal.     Mouth/Throat:     Mouth: Mucous membranes are moist.     Pharynx: Oropharynx is clear.  Eyes:     Pupils: Pupils are equal, round, and reactive to light.  Cardiovascular:     Rate and Rhythm: Normal rate and regular rhythm.     Pulses: Normal pulses.     Heart sounds: No murmur heard. Pulmonary:     Effort: Pulmonary effort is normal. No respiratory distress.     Breath sounds: Normal breath sounds. No rales.  Abdominal:     General: Abdomen is flat. Bowel sounds are normal.     Palpations: Abdomen is soft.  Musculoskeletal:        General: No swelling.     Cervical back: Neck supple.     Comments: Mild edema with Chronic Changes   Skin:    General: Skin is warm.     Comments: Has Macular rash in his Back and front  Neurological:     General: No focal deficit present.     Mental Status: He is alert and oriented to person, place, and time.  Psychiatric:        Mood and Affect: Mood normal.        Thought Content: Thought content normal.     Labs reviewed: Basic Metabolic Panel: Recent Labs    11/19/23 0000 04/05/24 0549 04/05/24 1300 04/07/24 0539  NA 137 140  --  138  K 4.6 4.3  --  4.1  CL 103 109  --  102  CO2 25* 23  --  25  GLUCOSE  --  127*  --  121*  BUN 22* 17  --  15  CREATININE 1.0 1.06 0.97 1.02  CALCIUM 9.3 9.0  --  8.9  MG  --  2.2  --  2.2  PHOS  --   --   --  3.4   Liver Function Tests: Recent Labs    11/19/23 0000  ALBUMIN 4.3   No results for input(s): "LIPASE", "AMYLASE" in the last 8760 hours. No results for input(s): "AMMONIA" in the last 8760 hours. CBC: Recent Labs    04/05/24 0549 04/05/24 1300 04/07/24 0539  WBC 10.3 9.0 11.2*  HGB 14.1 14.0 15.9  HCT 43.5 43.9 47.4  MCV 96.0 96.7 92.9  PLT 206 208 236   Cardiac Enzymes: No results for input(s): "CKTOTAL", "CKMB", "CKMBINDEX", "TROPONINI" in the last 8760 hours. BNP: Invalid input(s): "POCBNP" No results found for: "HGBA1C" Lab Results  Component Value Date   TSH 4.478 04/05/2024   Lab Results  Component Value Date   VITAMINB12 565 11/19/2023   No results found for: "FOLATE" No results found for: "IRON", "TIBC", "FERRITIN"  Imaging and Procedures obtained prior to SNF admission: ECHOCARDIOGRAM COMPLETE Result Date: 04/05/2024  ECHOCARDIOGRAM REPORT   Patient Name:   Daniel Moses Date of Exam: 04/05/2024 Medical Rec #:  [id number]    Height:       69.0 in Accession #:    [id number]   Weight:       200.0 lb Date of Birth:  [birth year]   BSA:          2.066 m Patient Age:    89 years     BP:           168/80 mmHg Patient Gender: M            HR:           40 bpm. Exam Location:  Inpatient Procedure: 2D  Echo, Color Doppler, Cardiac Doppler and Intracardiac            Opacification Agent (Both Spectral and Color Flow Doppler were            utilized during procedure). Indications:    Atrial fibrillation and Flutter  History:        Patient has no prior history of Echocardiogram examinations.                 Risk Factors:Hypertension and Dyslipidemia.  Sonographer:    Sherline Distel Senior RDCS Referring Phys: [id number] Arne Langdon  Sonographer Comments: Very poor apical windows due to patient body habitus. IMPRESSIONS  1. Left ventricular ejection fraction, by estimation, is 50 to 55%. The left ventricle has low normal function. Left ventricular endocardial border not optimally defined to evaluate regional wall motion. There is mild concentric left ventricular hypertrophy. Left ventricular diastolic parameters are indeterminate.  2. Right ventricular systolic function is normal. The right ventricular size is mildly enlarged.  3. Left atrial size was severely dilated.  4. Right atrial size was severely dilated.  5. The mitral valve is normal in structure. Mild mitral valve regurgitation. No evidence of mitral stenosis.  6. Multiple jets. Tricuspid valve regurgitation is severe.  7. The aortic valve is tricuspid. There is moderate calcification of the aortic valve. Aortic valve regurgitation is not visualized. Aortic valve sclerosis is present, with no evidence of aortic valve stenosis.  8. The inferior vena cava is dilated in size with <50% respiratory variability, suggesting right atrial pressure of 15 mmHg. FINDINGS  Left Ventricle: No Strain or 3D Transmitted. Left ventricular ejection fraction, by estimation, is 50 to 55%. The left ventricle has low normal function. Left ventricular endocardial border not optimally defined to evaluate regional wall motion. Definity  contrast agent was given IV to delineate the left ventricular endocardial borders. Strain was performed and the global longitudinal strain is indeterminate. The  left ventricular internal cavity size was normal in size. There is mild concentric left ventricular hypertrophy. Left ventricular diastolic parameters are indeterminate. Right Ventricle: The right ventricular size is mildly enlarged. No increase in right ventricular wall thickness. Right ventricular systolic function is normal. Left Atrium: Left atrial size was severely dilated. Right Atrium: Right atrial size was severely dilated. Pericardium: There is no evidence of pericardial effusion. Mitral Valve: The mitral valve is normal in structure. Mild mitral valve regurgitation. No evidence of mitral valve stenosis. Tricuspid Valve: Multiple jets. The tricuspid valve is normal in structure. Tricuspid valve regurgitation is severe. Aortic Valve: The aortic valve is tricuspid. There is moderate calcification of the aortic valve. Aortic valve regurgitation is not visualized. Aortic valve sclerosis is present, with no evidence of aortic valve stenosis. Pulmonic Valve: The pulmonic valve was not  well visualized. Pulmonic valve regurgitation is not visualized. No evidence of pulmonic stenosis. Aorta: The aortic root and ascending aorta are structurally normal, with no evidence of dilitation. Venous: The inferior vena cava is dilated in size with less than 50% respiratory variability, suggesting right atrial pressure of 15 mmHg. IAS/Shunts: No atrial level shunt detected by color flow Doppler. Additional Comments: 3D was performed not requiring image post processing on an independent workstation and was indeterminate.  LEFT VENTRICLE PLAX 2D LVIDd:         5.00 cm LVIDs:         3.60 cm LV PW:         1.10 cm LV IVS:        1.00 cm LVOT diam:     2.20 cm LV SV:         71 LV SV Index:   35 LVOT Area:     3.80 cm  RIGHT VENTRICLE RV S prime:     10.20 cm/s TAPSE (M-mode): 1.8 cm LEFT ATRIUM              Index        RIGHT ATRIUM           Index LA diam:        4.70 cm  2.27 cm/m   RA Area:     34.80 cm LA Vol (A2C):   115.0  ml 55.66 ml/m  RA Volume:   129.00 ml 62.44 ml/m LA Vol (A4C):   99.2 ml  48.02 ml/m LA Biplane Vol: 110.0 ml 53.24 ml/m  AORTIC VALVE LVOT Vmax:   88.60 cm/s LVOT Vmean:  61.200 cm/s LVOT VTI:    0.188 m  AORTA Ao Root diam: 3.20 cm Ao Asc diam:  3.50 cm TRICUSPID VALVE TR Peak grad:   40.7 mmHg TR Vmax:        319.00 cm/s  SHUNTS Systemic VTI:  0.19 m Systemic Diam: 2.20 cm Gloriann Larger MD Electronically signed by Gloriann Larger MD Signature Date/Time: 04/05/2024/6:11:16 PM    Final    CT CHEST WO CONTRAST Result Date: 04/05/2024 CLINICAL DATA:  Lung nodule EXAM: CT CHEST WITHOUT CONTRAST TECHNIQUE: Multidetector CT imaging of the chest was performed following the standard protocol without IV contrast. RADIATION DOSE REDUCTION: This exam was performed according to the departmental dose-optimization program which includes automated exposure control, adjustment of the mA and/or kV according to patient size and/or use of iterative reconstruction technique. COMPARISON:  CT abdomen and pelvis Mar 30, 2024 FINDINGS: Cardiovascular: No significant vascular findings. Normal heart size. No pericardial effusion. Extensive coronary artery calcifications Mediastinum/Nodes: Large nodular adenopathy along the prevascular aortic space measuring 15 x 20 mm, retrocaval pretracheal adenopathy measuring 11 x 15 mm. Lungs/Pleura: Prominence of the interstitial markings bilaterally could correlate with chronic interstitial lung disease with mild centrilobular emphysema and some atelectatic changes along the right and left mid lung and left lingular region. No obvious pulmonary nodules. Upper Abdomen: Questionable low-attenuation area within the right lobe of the liver on image number 57 correlates with the previously seen lesions on the prior CT of the abdomen refer to prior dictation. Musculoskeletal: No chest wall mass or suspicious bone lesions identified. IMPRESSION: *Prominence of the interstitial markings  bilaterally could correlate with chronic interstitial lung disease with mild centrilobular emphysema and some atelectatic changes along the right and left mid lung and left lingular region. *Large nodular adenopathy along the prevascular aortic space measuring 15 x 20 mm, retrocaval pretracheal  adenopathy measuring 11 x 15 mm. *Questionable low-attenuation area within the right lobe of the liver on image number 57 correlates with the previously seen lesions on the prior CT of the abdomen refer to prior dictation. Electronically Signed   By: Fredrich Jefferson M.D.   On: 04/05/2024 07:59   DG Chest Portable 1 View Result Date: 04/05/2024 CLINICAL DATA:  Bradycardia. EXAM: PORTABLE CHEST 1 VIEW COMPARISON:  None Available. FINDINGS: Low volume film. The cardio pericardial silhouette is enlarged. Streaky density at the left base suggest atelectasis. Two view study. Circular density identified in the right apex, potentially related to the anterior first rib end. No acute bony abnormality. Telemetry leads overlie the chest. IMPRESSION: 1. Low volume film with streaky density at the left base suggesting atelectasis. 2. Circular density in the right apex, potentially related to the anterior first rib end. CT chest without contrast recommended to exclude pulmonary parenchymal lesion. Electronically Signed   By: Donnal Fusi M.D.   On: 04/05/2024 06:24    Assessment/Plan 1. S/P placement of cardiac pacemaker (Primary) Doing well  2. Rash He was started on Namenda  few months ago Possible cause of rash Will Discontinue it for now Use Triamcinolone  BID till get resolved  3. Falls Is working with therapy Falls are mostly due to him forgetting Otho Blitz And his Knee gives up due to arthritis  4. Mixed Alzheimer and vascular dementia (HCC) On Galantamine  Will Discontinue Namenda  due to rash MMSE 25/30 5. Mixed hyperlipidemia statin  6. Primary hypertension Losartan   7. Benign prostatic hyperplasia with  incomplete bladder emptying Proscar  and Flomax  He does have Hematuria History and Urology is planning to do further work up   [employer] staff Communication:   Labs/tests ordered:

## 2024-04-12 ENCOUNTER — Encounter: Payer: Self-pay | Admitting: Internal Medicine

## 2024-04-12 DIAGNOSIS — B351 Tinea unguium: Secondary | ICD-10-CM | POA: Diagnosis not present

## 2024-04-12 DIAGNOSIS — M6289 Other specified disorders of muscle: Secondary | ICD-10-CM | POA: Diagnosis not present

## 2024-04-12 DIAGNOSIS — E1159 Type 2 diabetes mellitus with other circulatory complications: Secondary | ICD-10-CM | POA: Diagnosis not present

## 2024-04-12 DIAGNOSIS — Z79899 Other long term (current) drug therapy: Secondary | ICD-10-CM | POA: Diagnosis not present

## 2024-04-12 DIAGNOSIS — M62561 Muscle wasting and atrophy, not elsewhere classified, right lower leg: Secondary | ICD-10-CM | POA: Diagnosis not present

## 2024-04-12 DIAGNOSIS — L84 Corns and callosities: Secondary | ICD-10-CM | POA: Diagnosis not present

## 2024-04-12 DIAGNOSIS — R41841 Cognitive communication deficit: Secondary | ICD-10-CM | POA: Diagnosis not present

## 2024-04-12 DIAGNOSIS — R2689 Other abnormalities of gait and mobility: Secondary | ICD-10-CM | POA: Diagnosis not present

## 2024-04-12 DIAGNOSIS — I483 Typical atrial flutter: Secondary | ICD-10-CM | POA: Diagnosis not present

## 2024-04-12 DIAGNOSIS — F01A0 Vascular dementia, mild, without behavioral disturbance, psychotic disturbance, mood disturbance, and anxiety: Secondary | ICD-10-CM | POA: Diagnosis not present

## 2024-04-12 LAB — BASIC METABOLIC PANEL WITH GFR
BUN: 18 (ref 4–21)
CO2: 25 — AB (ref 13–22)
Chloride: 102 (ref 99–108)
Creatinine: 1 (ref 0.6–1.3)
Glucose: 123
Potassium: 5.1 meq/L (ref 3.5–5.1)
Sodium: 139 (ref 137–147)

## 2024-04-12 LAB — CBC AND DIFFERENTIAL
HCT: 48 (ref 41–53)
Hemoglobin: 16.3 (ref 13.5–17.5)
Platelets: 239 K/uL (ref 150–400)
WBC: 7.2

## 2024-04-12 LAB — COMPREHENSIVE METABOLIC PANEL WITH GFR
Calcium: 9.2 (ref 8.7–10.7)
eGFR: 75

## 2024-04-12 LAB — CBC: RBC: 5.12 — AB (ref 3.87–5.11)

## 2024-04-13 DIAGNOSIS — R41841 Cognitive communication deficit: Secondary | ICD-10-CM | POA: Diagnosis not present

## 2024-04-13 DIAGNOSIS — I483 Typical atrial flutter: Secondary | ICD-10-CM | POA: Diagnosis not present

## 2024-04-13 DIAGNOSIS — R310 Gross hematuria: Secondary | ICD-10-CM | POA: Diagnosis not present

## 2024-04-13 DIAGNOSIS — M62561 Muscle wasting and atrophy, not elsewhere classified, right lower leg: Secondary | ICD-10-CM | POA: Diagnosis not present

## 2024-04-13 DIAGNOSIS — R2689 Other abnormalities of gait and mobility: Secondary | ICD-10-CM | POA: Diagnosis not present

## 2024-04-13 DIAGNOSIS — M6289 Other specified disorders of muscle: Secondary | ICD-10-CM | POA: Diagnosis not present

## 2024-04-13 DIAGNOSIS — F01A0 Vascular dementia, mild, without behavioral disturbance, psychotic disturbance, mood disturbance, and anxiety: Secondary | ICD-10-CM | POA: Diagnosis not present

## 2024-04-14 DIAGNOSIS — M6289 Other specified disorders of muscle: Secondary | ICD-10-CM | POA: Diagnosis not present

## 2024-04-14 DIAGNOSIS — I483 Typical atrial flutter: Secondary | ICD-10-CM | POA: Diagnosis not present

## 2024-04-14 DIAGNOSIS — F01A0 Vascular dementia, mild, without behavioral disturbance, psychotic disturbance, mood disturbance, and anxiety: Secondary | ICD-10-CM | POA: Diagnosis not present

## 2024-04-14 DIAGNOSIS — M62561 Muscle wasting and atrophy, not elsewhere classified, right lower leg: Secondary | ICD-10-CM | POA: Diagnosis not present

## 2024-04-14 DIAGNOSIS — R41841 Cognitive communication deficit: Secondary | ICD-10-CM | POA: Diagnosis not present

## 2024-04-14 DIAGNOSIS — R2689 Other abnormalities of gait and mobility: Secondary | ICD-10-CM | POA: Diagnosis not present

## 2024-04-15 DIAGNOSIS — M6289 Other specified disorders of muscle: Secondary | ICD-10-CM | POA: Diagnosis not present

## 2024-04-15 DIAGNOSIS — M62561 Muscle wasting and atrophy, not elsewhere classified, right lower leg: Secondary | ICD-10-CM | POA: Diagnosis not present

## 2024-04-15 DIAGNOSIS — R41841 Cognitive communication deficit: Secondary | ICD-10-CM | POA: Diagnosis not present

## 2024-04-15 DIAGNOSIS — F01A0 Vascular dementia, mild, without behavioral disturbance, psychotic disturbance, mood disturbance, and anxiety: Secondary | ICD-10-CM | POA: Diagnosis not present

## 2024-04-15 DIAGNOSIS — I483 Typical atrial flutter: Secondary | ICD-10-CM | POA: Diagnosis not present

## 2024-04-15 DIAGNOSIS — R2689 Other abnormalities of gait and mobility: Secondary | ICD-10-CM | POA: Diagnosis not present

## 2024-04-18 DIAGNOSIS — I483 Typical atrial flutter: Secondary | ICD-10-CM | POA: Diagnosis not present

## 2024-04-18 DIAGNOSIS — M6289 Other specified disorders of muscle: Secondary | ICD-10-CM | POA: Diagnosis not present

## 2024-04-18 DIAGNOSIS — M62561 Muscle wasting and atrophy, not elsewhere classified, right lower leg: Secondary | ICD-10-CM | POA: Diagnosis not present

## 2024-04-18 DIAGNOSIS — R2689 Other abnormalities of gait and mobility: Secondary | ICD-10-CM | POA: Diagnosis not present

## 2024-04-18 DIAGNOSIS — F01A0 Vascular dementia, mild, without behavioral disturbance, psychotic disturbance, mood disturbance, and anxiety: Secondary | ICD-10-CM | POA: Diagnosis not present

## 2024-04-18 DIAGNOSIS — R41841 Cognitive communication deficit: Secondary | ICD-10-CM | POA: Diagnosis not present

## 2024-04-19 DIAGNOSIS — M62561 Muscle wasting and atrophy, not elsewhere classified, right lower leg: Secondary | ICD-10-CM | POA: Diagnosis not present

## 2024-04-19 DIAGNOSIS — R2689 Other abnormalities of gait and mobility: Secondary | ICD-10-CM | POA: Diagnosis not present

## 2024-04-19 DIAGNOSIS — I483 Typical atrial flutter: Secondary | ICD-10-CM | POA: Diagnosis not present

## 2024-04-19 DIAGNOSIS — R41841 Cognitive communication deficit: Secondary | ICD-10-CM | POA: Diagnosis not present

## 2024-04-19 DIAGNOSIS — M6289 Other specified disorders of muscle: Secondary | ICD-10-CM | POA: Diagnosis not present

## 2024-04-19 DIAGNOSIS — F01A0 Vascular dementia, mild, without behavioral disturbance, psychotic disturbance, mood disturbance, and anxiety: Secondary | ICD-10-CM | POA: Diagnosis not present

## 2024-04-20 ENCOUNTER — Ambulatory Visit: Attending: Internal Medicine

## 2024-04-20 DIAGNOSIS — F01A0 Vascular dementia, mild, without behavioral disturbance, psychotic disturbance, mood disturbance, and anxiety: Secondary | ICD-10-CM | POA: Diagnosis not present

## 2024-04-20 DIAGNOSIS — R41841 Cognitive communication deficit: Secondary | ICD-10-CM | POA: Diagnosis not present

## 2024-04-20 DIAGNOSIS — R2689 Other abnormalities of gait and mobility: Secondary | ICD-10-CM | POA: Diagnosis not present

## 2024-04-20 DIAGNOSIS — I483 Typical atrial flutter: Secondary | ICD-10-CM | POA: Diagnosis not present

## 2024-04-20 DIAGNOSIS — M62561 Muscle wasting and atrophy, not elsewhere classified, right lower leg: Secondary | ICD-10-CM | POA: Diagnosis not present

## 2024-04-20 DIAGNOSIS — M6289 Other specified disorders of muscle: Secondary | ICD-10-CM | POA: Diagnosis not present

## 2024-04-21 DIAGNOSIS — M6289 Other specified disorders of muscle: Secondary | ICD-10-CM | POA: Diagnosis not present

## 2024-04-21 DIAGNOSIS — M62561 Muscle wasting and atrophy, not elsewhere classified, right lower leg: Secondary | ICD-10-CM | POA: Diagnosis not present

## 2024-04-21 DIAGNOSIS — F01A0 Vascular dementia, mild, without behavioral disturbance, psychotic disturbance, mood disturbance, and anxiety: Secondary | ICD-10-CM | POA: Diagnosis not present

## 2024-04-21 DIAGNOSIS — R2689 Other abnormalities of gait and mobility: Secondary | ICD-10-CM | POA: Diagnosis not present

## 2024-04-21 DIAGNOSIS — R41841 Cognitive communication deficit: Secondary | ICD-10-CM | POA: Diagnosis not present

## 2024-04-21 DIAGNOSIS — I483 Typical atrial flutter: Secondary | ICD-10-CM | POA: Diagnosis not present

## 2024-04-22 DIAGNOSIS — R41841 Cognitive communication deficit: Secondary | ICD-10-CM | POA: Diagnosis not present

## 2024-04-22 DIAGNOSIS — M6289 Other specified disorders of muscle: Secondary | ICD-10-CM | POA: Diagnosis not present

## 2024-04-22 DIAGNOSIS — M62561 Muscle wasting and atrophy, not elsewhere classified, right lower leg: Secondary | ICD-10-CM | POA: Diagnosis not present

## 2024-04-22 DIAGNOSIS — I483 Typical atrial flutter: Secondary | ICD-10-CM | POA: Diagnosis not present

## 2024-04-22 DIAGNOSIS — R2689 Other abnormalities of gait and mobility: Secondary | ICD-10-CM | POA: Diagnosis not present

## 2024-04-22 DIAGNOSIS — F01A0 Vascular dementia, mild, without behavioral disturbance, psychotic disturbance, mood disturbance, and anxiety: Secondary | ICD-10-CM | POA: Diagnosis not present

## 2024-04-25 DIAGNOSIS — R2689 Other abnormalities of gait and mobility: Secondary | ICD-10-CM | POA: Diagnosis not present

## 2024-04-25 DIAGNOSIS — I483 Typical atrial flutter: Secondary | ICD-10-CM | POA: Diagnosis not present

## 2024-04-25 DIAGNOSIS — Z9181 History of falling: Secondary | ICD-10-CM | POA: Diagnosis not present

## 2024-04-25 DIAGNOSIS — M62561 Muscle wasting and atrophy, not elsewhere classified, right lower leg: Secondary | ICD-10-CM | POA: Diagnosis not present

## 2024-04-25 DIAGNOSIS — F01A0 Vascular dementia, mild, without behavioral disturbance, psychotic disturbance, mood disturbance, and anxiety: Secondary | ICD-10-CM | POA: Diagnosis not present

## 2024-04-25 DIAGNOSIS — I2481 Acute coronary microvascular dysfunction: Secondary | ICD-10-CM | POA: Diagnosis not present

## 2024-04-25 DIAGNOSIS — M62562 Muscle wasting and atrophy, not elsewhere classified, left lower leg: Secondary | ICD-10-CM | POA: Diagnosis not present

## 2024-04-25 DIAGNOSIS — R41841 Cognitive communication deficit: Secondary | ICD-10-CM | POA: Diagnosis not present

## 2024-04-25 DIAGNOSIS — Z950 Presence of cardiac pacemaker: Secondary | ICD-10-CM | POA: Diagnosis not present

## 2024-04-25 DIAGNOSIS — M6289 Other specified disorders of muscle: Secondary | ICD-10-CM | POA: Diagnosis not present

## 2024-04-26 ENCOUNTER — Non-Acute Institutional Stay (SKILLED_NURSING_FACILITY): Payer: Self-pay | Admitting: Orthopedic Surgery

## 2024-04-26 ENCOUNTER — Encounter: Payer: Self-pay | Admitting: Orthopedic Surgery

## 2024-04-26 DIAGNOSIS — R2689 Other abnormalities of gait and mobility: Secondary | ICD-10-CM | POA: Diagnosis not present

## 2024-04-26 DIAGNOSIS — M6289 Other specified disorders of muscle: Secondary | ICD-10-CM | POA: Diagnosis not present

## 2024-04-26 DIAGNOSIS — R062 Wheezing: Secondary | ICD-10-CM | POA: Diagnosis not present

## 2024-04-26 DIAGNOSIS — Z95811 Presence of heart assist device: Secondary | ICD-10-CM | POA: Diagnosis not present

## 2024-04-26 DIAGNOSIS — J189 Pneumonia, unspecified organism: Secondary | ICD-10-CM

## 2024-04-26 DIAGNOSIS — R918 Other nonspecific abnormal finding of lung field: Secondary | ICD-10-CM | POA: Diagnosis not present

## 2024-04-26 DIAGNOSIS — I483 Typical atrial flutter: Secondary | ICD-10-CM | POA: Diagnosis not present

## 2024-04-26 DIAGNOSIS — M62561 Muscle wasting and atrophy, not elsewhere classified, right lower leg: Secondary | ICD-10-CM | POA: Diagnosis not present

## 2024-04-26 DIAGNOSIS — R41841 Cognitive communication deficit: Secondary | ICD-10-CM | POA: Diagnosis not present

## 2024-04-26 DIAGNOSIS — F01A0 Vascular dementia, mild, without behavioral disturbance, psychotic disturbance, mood disturbance, and anxiety: Secondary | ICD-10-CM | POA: Diagnosis not present

## 2024-04-26 NOTE — Progress Notes (Unsigned)
 Location:  [facility] Room Number: 157/A Place of Service:  SNF [phone]) Provider:  Rupert Counts, MD  Patient Care Team: Marguerite Shiley, MD as PCP - General (Internal Medicine) Thukkani, Arun K, MD as PCP - Cardiology (Cardiology)  Extended Emergency Contact Information Primary Emergency Contact: Plaskett,BETTY A Address: [address]          [place], [place] [address] [place] Home Phone: [phone] Relation: Spouse Secondary Emergency Contact: Woodson,Cindy Mobile Phone: [phone] Relation: Daughter  Code Status:  DNR Goals of care: Advanced Directive information    04/26/2024    3:48 PM  Advanced Directives  Does Patient Have a Medical Advance Directive? Yes  Type of Advance Directive Living will;Out of facility DNR (pink MOST or yellow form)  Does patient want to make changes to medical advance directive? No - Patient declined     Chief Complaint  Patient presents with   Cough    Cough and congestion.    HPI:  Pt is a 89 y.o. male seen today for an acute visit for    Past Medical History:  Diagnosis Date   Arthritis    back and knees   Benign prostatic hyperplasia with incomplete bladder emptying 06/15/2023   Hyperlipidemia 06/15/2023   Mild cognitive impairment 06/15/2023   Primary hypertension 06/15/2023   Primary osteoarthritis of both knees 06/15/2023   Varicose veins    Varicose veins of bilateral lower extremities with other complications 08/03/2013   IMO SNOMED Dx Update Oct 2024     Past Surgical History:  Procedure Laterality Date   INNER EAR SURGERY     PACEMAKER IMPLANT N/A 04/07/2024   Procedure: PACEMAKER IMPLANT;  Surgeon: Boyce Byes, MD;  Location: [facility] INVASIVE CV [facility];  Service: Cardiovascular;  Laterality: N/A;   SQUAMOUS CELL CARCINOMA EXCISION     on chest    Allergies  Allergen Reactions   Atorvastatin Other (See Comments)    Myalgia    Outpatient  Encounter Medications as of 04/26/2024  Medication Sig   acetaminophen  (TYLENOL ) 500 MG tablet Take 1,000 mg by mouth 2 (two) times daily. May take one additional 1000mg  dose as needed for pain   Dextromethorphan-guaiFENesin (ROBAFEN DM [facility]/CHEST CONGEST PO) Take 10 mLs by mouth every 6 (six) hours as needed. 10 mL, oral, Every 6 Hours - PRN, Robafen DM (avoid if pt has high blood pressure) 10mL p.o. q 6 hours PRN cough and congestion x 2 days. If persists > 2 days or fever over 100,notify provider.   diclofenac  Sodium (VOLTAREN ) 1 % GEL Apply 4 g topically in the morning, at noon, and at bedtime.   ezetimibe  (ZETIA ) 10 MG tablet Take 10 mg by mouth daily.   finasteride  (PROSCAR ) 5 MG tablet Take 5 mg by mouth in the morning.   fluticasone  (FLONASE ) 50 MCG/ACT nasal spray Place 1 spray into both nostrils 2 (two) times daily as needed for allergies or rhinitis.   galantamine  (RAZADYNE  ER) 8 MG 24 hr capsule Take 8 mg by mouth daily with breakfast.   guaiFENesin (MUCINEX) 600 MG 12 hr tablet Take 600 mg by mouth 2 (two) times daily. 600mg , oral, Twice A Day, Mucinex 600 mg po BID for 2 days. If persists> 2 days or fever over 100, notify provider.   ipratropium-albuterol (DUONEB) 0.5-2.5 (3) MG/3ML SOLN Take 3 mLs by nebulization 2 (two) times daily as needed. 1, inhalation, Twice A Day, Duonebs BID x 2 days,  then BID PRN x 10 days for wheezing   losartan  (COZAAR ) 25 MG tablet Take 25 mg by mouth every morning.   Multiple Vitamins-Minerals (CENTRUM SILVER) CHEW Chew 1 tablet by mouth in the morning.   simvastatin  (ZOCOR ) 40 MG tablet Take 40 mg by mouth every evening.   tamsulosin  (FLOMAX ) 0.4 MG CAPS capsule Take 0.4 mg by mouth daily.   triamcinolone  ointment (KENALOG ) 0.1 % Apply 1 Application topically 2 (two) times daily as needed.   No facility-administered encounter medications on file as of 04/26/2024.    Review of Systems  Immunization History  Administered Date(s) Administered   Fluad  Trivalent(High Dose 65+) 09/15/2023   Influenza Split 03/28/2010, 09/24/2010, 12/17/2011, 02/28/2013, 08/15/2013, 08/23/2014, 08/05/2020   Influenza, High Dose Seasonal PF 08/15/2015, 07/29/2016, 08/25/2017   Influenza-Unspecified 08/04/2018, 07/14/2019   Moderna Covid-19 Vaccine  Bivalent Booster 34yrs & up 09/15/2023   Moderna SARS-COV2 Booster Vaccination 06/12/2021   PFIZER(Purple Top)SARS-COV-2 Vaccination 08/04/2019, 08/23/2019, 03/13/2020, 09/06/2021   PNEUMOCOCCAL CONJUGATE-20 12/20/2021   Pfizer Covid-19 Vaccine Bivalent Booster 2yrs & up 09/29/2022, 03/19/2023   Pneumococcal Polysaccharide-23 08/18/2007, 03/28/2010, 12/20/2021   Pneumococcal-Unspecified 02/28/2013   RSV,unspecified 11/25/2022   Td (Adult),5 Lf Tetanus Toxid, Preservative Free 09/14/2009   Tdap 09/14/2009, 03/28/2010   Tetanus 08/20/2023   Zoster Recombinant(Shingrix) 03/10/2018, 06/15/2018   Zoster, Live 03/28/2010, 12/17/2011, 03/10/2018   Pertinent  Health Maintenance Due  Topic Date Due   INFLUENZA VACCINE  06/24/2024      08/18/2023   10:02 AM 02/23/2024    1:46 PM  Fall Risk  Falls in the past year? 1 0  Was there an injury with Fall? 1 0  Fall Risk Category Calculator 3 0  Patient at Risk for Falls Due to History of fall(s);Impaired balance/gait;Impaired mobility Impaired balance/gait;Impaired mobility  Fall risk Follow up Falls evaluation completed Falls evaluation completed   Functional Status Survey:    Vitals:   04/26/24 1539 04/26/24 1540  BP: (!) 161/95 (!) 158/80  Pulse: 79   Resp: 18   Temp: 98.3 F (36.8 C)   SpO2: 95%   Weight: 192 lb 12.8 oz (87.5 kg)   Height: 5\' 9"  (1.753 m)    Body mass index is 28.47 kg/m. Physical Exam  Labs reviewed: Recent Labs    11/19/23 0000 04/05/24 0549 04/05/24 1300 04/07/24 0539  NA 137 140  --  138  K 4.6 4.3  --  4.1  CL 103 109  --  102  CO2 25* 23  --  25  GLUCOSE  --  127*  --  121*  BUN 22* 17  --  15  CREATININE 1.0 1.06 0.97  1.02  CALCIUM 9.3 9.0  --  8.9  MG  --  2.2  --  2.2  PHOS  --   --   --  3.4   Recent Labs    11/19/23 0000  ALBUMIN 4.3   Recent Labs    04/05/24 0549 04/05/24 1300 04/07/24 0539  WBC 10.3 9.0 11.2*  HGB 14.1 14.0 15.9  HCT 43.5 43.9 47.4  MCV 96.0 96.7 92.9  PLT 206 208 236   Lab Results  Component Value Date   TSH 4.478 04/05/2024   No results found for: "HGBA1C" Lab Results  Component Value Date   CHOL 116 11/19/2023   HDL 55 11/19/2023   LDLCALC 46 11/19/2023   TRIG 73 11/19/2023    Significant Diagnostic Results in last 30 days:  DG CHEST PORT 1 VIEW Result  Date: 04/08/2024 CLINICAL DATA:  Pacemaker placement. EXAM: PORTABLE CHEST 1 VIEW COMPARISON:  04/05/2024 FINDINGS: The cardio pericardial silhouette is enlarged. Low volume film without overt edema or focal consolidation. No evidence for pneumothorax or pleural effusion. Left-sided dual lead permanent pacemaker is new in the interval. Telemetry leads overlie the chest. IMPRESSION: Interval placement of left-sided dual lead permanent pacemaker without pneumothorax. Electronically Signed   By: Donnal Fusi M.D.   On: 04/08/2024 09:04   EP PPM/ICD IMPLANT Result Date: 04/07/2024  CONCLUSIONS:  1.  Symptomatic bradycardia  2.  Successful dual-chamber permanent pacemaker with left bundle area lead  3.  No early apparent complications.  4.  Hold anticoagulation for 5 days (okay to restart Apr 13, 2024)   ECHOCARDIOGRAM COMPLETE Result Date: 04/05/2024    ECHOCARDIOGRAM REPORT   Patient Name:   Daniel Moses Date of Exam: 04/05/2024 Medical Rec #:  [id number]    Height:       69.0 in Accession #:    [id number]   Weight:       200.0 lb Date of Birth:  [birth year]   BSA:          2.066 m Patient Age:    89 years     BP:           168/80 mmHg Patient Gender: M            HR:           40 bpm. Exam Location:  Inpatient Procedure: 2D Echo, Color Doppler, Cardiac Doppler and Intracardiac            Opacification Agent (Both  Spectral and Color Flow Doppler were            utilized during procedure). Indications:    Atrial fibrillation and Flutter  History:        Patient has no prior history of Echocardiogram examinations.                 Risk Factors:Hypertension and Dyslipidemia.  Sonographer:    Sherline Distel Senior RDCS Referring Phys: [id number] Arne Langdon  Sonographer Comments: Very poor apical windows due to patient body habitus. IMPRESSIONS  1. Left ventricular ejection fraction, by estimation, is 50 to 55%. The left ventricle has low normal function. Left ventricular endocardial border not optimally defined to evaluate regional wall motion. There is mild concentric left ventricular hypertrophy. Left ventricular diastolic parameters are indeterminate.  2. Right ventricular systolic function is normal. The right ventricular size is mildly enlarged.  3. Left atrial size was severely dilated.  4. Right atrial size was severely dilated.  5. The mitral valve is normal in structure. Mild mitral valve regurgitation. No evidence of mitral stenosis.  6. Multiple jets. Tricuspid valve regurgitation is severe.  7. The aortic valve is tricuspid. There is moderate calcification of the aortic valve. Aortic valve regurgitation is not visualized. Aortic valve sclerosis is present, with no evidence of aortic valve stenosis.  8. The inferior vena cava is dilated in size with <50% respiratory variability, suggesting right atrial pressure of 15 mmHg. FINDINGS  Left Ventricle: No Strain or 3D Transmitted. Left ventricular ejection fraction, by estimation, is 50 to 55%. The left ventricle has low normal function. Left ventricular endocardial border not optimally defined to evaluate regional wall motion. Definity  contrast agent was given IV to delineate the left ventricular endocardial borders. Strain was performed and the global longitudinal strain is indeterminate. The left ventricular internal cavity size was normal in  size. There is mild concentric left  ventricular hypertrophy. Left ventricular diastolic parameters are indeterminate. Right Ventricle: The right ventricular size is mildly enlarged. No increase in right ventricular wall thickness. Right ventricular systolic function is normal. Left Atrium: Left atrial size was severely dilated. Right Atrium: Right atrial size was severely dilated. Pericardium: There is no evidence of pericardial effusion. Mitral Valve: The mitral valve is normal in structure. Mild mitral valve regurgitation. No evidence of mitral valve stenosis. Tricuspid Valve: Multiple jets. The tricuspid valve is normal in structure. Tricuspid valve regurgitation is severe. Aortic Valve: The aortic valve is tricuspid. There is moderate calcification of the aortic valve. Aortic valve regurgitation is not visualized. Aortic valve sclerosis is present, with no evidence of aortic valve stenosis. Pulmonic Valve: The pulmonic valve was not well visualized. Pulmonic valve regurgitation is not visualized. No evidence of pulmonic stenosis. Aorta: The aortic root and ascending aorta are structurally normal, with no evidence of dilitation. Venous: The inferior vena cava is dilated in size with less than 50% respiratory variability, suggesting right atrial pressure of 15 mmHg. IAS/Shunts: No atrial level shunt detected by color flow Doppler. Additional Comments: 3D was performed not requiring image post processing on an independent workstation and was indeterminate.  LEFT VENTRICLE PLAX 2D LVIDd:         5.00 cm LVIDs:         3.60 cm LV PW:         1.10 cm LV IVS:        1.00 cm LVOT diam:     2.20 cm LV SV:         71 LV SV Index:   35 LVOT Area:     3.80 cm  RIGHT VENTRICLE RV S prime:     10.20 cm/s TAPSE (M-mode): 1.8 cm LEFT ATRIUM              Index        RIGHT ATRIUM           Index LA diam:        4.70 cm  2.27 cm/m   RA Area:     34.80 cm LA Vol (A2C):   115.0 ml 55.66 ml/m  RA Volume:   129.00 ml 62.44 ml/m LA Vol (A4C):   99.2 ml  48.02 ml/m  LA Biplane Vol: 110.0 ml 53.24 ml/m  AORTIC VALVE LVOT Vmax:   88.60 cm/s LVOT Vmean:  61.200 cm/s LVOT VTI:    0.188 m  AORTA Ao Root diam: 3.20 cm Ao Asc diam:  3.50 cm TRICUSPID VALVE TR Peak grad:   40.7 mmHg TR Vmax:        319.00 cm/s  SHUNTS Systemic VTI:  0.19 m Systemic Diam: 2.20 cm Gloriann Larger MD Electronically signed by Gloriann Larger MD Signature Date/Time: 04/05/2024/6:11:16 PM    Final    CT CHEST WO CONTRAST Result Date: 04/05/2024 CLINICAL DATA:  Lung nodule EXAM: CT CHEST WITHOUT CONTRAST TECHNIQUE: Multidetector CT imaging of the chest was performed following the standard protocol without IV contrast. RADIATION DOSE REDUCTION: This exam was performed according to the departmental dose-optimization program which includes automated exposure control, adjustment of the mA and/or kV according to patient size and/or use of iterative reconstruction technique. COMPARISON:  CT abdomen and pelvis Mar 30, 2024 FINDINGS: Cardiovascular: No significant vascular findings. Normal heart size. No pericardial effusion. Extensive coronary artery calcifications Mediastinum/Nodes: Large nodular adenopathy along the prevascular aortic space measuring 15 x 20 mm, retrocaval  pretracheal adenopathy measuring 11 x 15 mm. Lungs/Pleura: Prominence of the interstitial markings bilaterally could correlate with chronic interstitial lung disease with mild centrilobular emphysema and some atelectatic changes along the right and left mid lung and left lingular region. No obvious pulmonary nodules. Upper Abdomen: Questionable low-attenuation area within the right lobe of the liver on image number 57 correlates with the previously seen lesions on the prior CT of the abdomen refer to prior dictation. Musculoskeletal: No chest wall mass or suspicious bone lesions identified. IMPRESSION: *Prominence of the interstitial markings bilaterally could correlate with chronic interstitial lung disease with mild centrilobular  emphysema and some atelectatic changes along the right and left mid lung and left lingular region. *Large nodular adenopathy along the prevascular aortic space measuring 15 x 20 mm, retrocaval pretracheal adenopathy measuring 11 x 15 mm. *Questionable low-attenuation area within the right lobe of the liver on image number 57 correlates with the previously seen lesions on the prior CT of the abdomen refer to prior dictation. Electronically Signed   By: Fredrich Jefferson M.D.   On: 04/05/2024 07:59   DG Chest Portable 1 View Result Date: 04/05/2024 CLINICAL DATA:  Bradycardia. EXAM: PORTABLE CHEST 1 VIEW COMPARISON:  None Available. FINDINGS: Low volume film. The cardio pericardial silhouette is enlarged. Streaky density at the left base suggest atelectasis. Two view study. Circular density identified in the right apex, potentially related to the anterior first rib end. No acute bony abnormality. Telemetry leads overlie the chest. IMPRESSION: 1. Low volume film with streaky density at the left base suggesting atelectasis. 2. Circular density in the right apex, potentially related to the anterior first rib end. CT chest without contrast recommended to exclude pulmonary parenchymal lesion. Electronically Signed   By: Donnal Fusi M.D.   On: 04/05/2024 06:24    Assessment/Plan There are no diagnoses linked to this encounter.   Family/ staff Communication: ***  Labs/tests ordered:  ***

## 2024-04-27 ENCOUNTER — Telehealth: Payer: Self-pay | Admitting: Adult Health

## 2024-04-27 DIAGNOSIS — F01A0 Vascular dementia, mild, without behavioral disturbance, psychotic disturbance, mood disturbance, and anxiety: Secondary | ICD-10-CM | POA: Diagnosis not present

## 2024-04-27 DIAGNOSIS — R2689 Other abnormalities of gait and mobility: Secondary | ICD-10-CM | POA: Diagnosis not present

## 2024-04-27 DIAGNOSIS — I483 Typical atrial flutter: Secondary | ICD-10-CM | POA: Diagnosis not present

## 2024-04-27 DIAGNOSIS — M6289 Other specified disorders of muscle: Secondary | ICD-10-CM | POA: Diagnosis not present

## 2024-04-27 DIAGNOSIS — R41841 Cognitive communication deficit: Secondary | ICD-10-CM | POA: Diagnosis not present

## 2024-04-27 DIAGNOSIS — M62561 Muscle wasting and atrophy, not elsewhere classified, right lower leg: Secondary | ICD-10-CM | POA: Diagnosis not present

## 2024-04-27 MED ORDER — DOXYCYCLINE HYCLATE 100 MG PO TABS: 100.0000 mg | ORAL_TABLET | Freq: Two times a day (BID) | ORAL | Status: AC

## 2024-04-27 NOTE — Telephone Encounter (Signed)
 Nurse called to report CXR results done for cough and congestion which indicated infrahilar inflammation vs. Pneumonia Doxycycline prescribed.

## 2024-04-28 DIAGNOSIS — R2689 Other abnormalities of gait and mobility: Secondary | ICD-10-CM | POA: Diagnosis not present

## 2024-04-28 DIAGNOSIS — M62561 Muscle wasting and atrophy, not elsewhere classified, right lower leg: Secondary | ICD-10-CM | POA: Diagnosis not present

## 2024-04-28 DIAGNOSIS — M6289 Other specified disorders of muscle: Secondary | ICD-10-CM | POA: Diagnosis not present

## 2024-04-28 DIAGNOSIS — R41841 Cognitive communication deficit: Secondary | ICD-10-CM | POA: Diagnosis not present

## 2024-04-28 DIAGNOSIS — I483 Typical atrial flutter: Secondary | ICD-10-CM | POA: Diagnosis not present

## 2024-04-28 DIAGNOSIS — F01A0 Vascular dementia, mild, without behavioral disturbance, psychotic disturbance, mood disturbance, and anxiety: Secondary | ICD-10-CM | POA: Diagnosis not present

## 2024-04-29 DIAGNOSIS — R41841 Cognitive communication deficit: Secondary | ICD-10-CM | POA: Diagnosis not present

## 2024-04-29 DIAGNOSIS — I483 Typical atrial flutter: Secondary | ICD-10-CM | POA: Diagnosis not present

## 2024-04-29 DIAGNOSIS — R2689 Other abnormalities of gait and mobility: Secondary | ICD-10-CM | POA: Diagnosis not present

## 2024-04-29 DIAGNOSIS — F01A0 Vascular dementia, mild, without behavioral disturbance, psychotic disturbance, mood disturbance, and anxiety: Secondary | ICD-10-CM | POA: Diagnosis not present

## 2024-04-29 DIAGNOSIS — M6289 Other specified disorders of muscle: Secondary | ICD-10-CM | POA: Diagnosis not present

## 2024-04-29 DIAGNOSIS — M62561 Muscle wasting and atrophy, not elsewhere classified, right lower leg: Secondary | ICD-10-CM | POA: Diagnosis not present

## 2024-04-30 DIAGNOSIS — R2689 Other abnormalities of gait and mobility: Secondary | ICD-10-CM | POA: Diagnosis not present

## 2024-04-30 DIAGNOSIS — M62561 Muscle wasting and atrophy, not elsewhere classified, right lower leg: Secondary | ICD-10-CM | POA: Diagnosis not present

## 2024-04-30 DIAGNOSIS — M6289 Other specified disorders of muscle: Secondary | ICD-10-CM | POA: Diagnosis not present

## 2024-04-30 DIAGNOSIS — F01A0 Vascular dementia, mild, without behavioral disturbance, psychotic disturbance, mood disturbance, and anxiety: Secondary | ICD-10-CM | POA: Diagnosis not present

## 2024-04-30 DIAGNOSIS — R41841 Cognitive communication deficit: Secondary | ICD-10-CM | POA: Diagnosis not present

## 2024-04-30 DIAGNOSIS — I483 Typical atrial flutter: Secondary | ICD-10-CM | POA: Diagnosis not present

## 2024-05-01 DIAGNOSIS — I483 Typical atrial flutter: Secondary | ICD-10-CM | POA: Diagnosis not present

## 2024-05-01 DIAGNOSIS — F01A0 Vascular dementia, mild, without behavioral disturbance, psychotic disturbance, mood disturbance, and anxiety: Secondary | ICD-10-CM | POA: Diagnosis not present

## 2024-05-01 DIAGNOSIS — M6289 Other specified disorders of muscle: Secondary | ICD-10-CM | POA: Diagnosis not present

## 2024-05-01 DIAGNOSIS — R2689 Other abnormalities of gait and mobility: Secondary | ICD-10-CM | POA: Diagnosis not present

## 2024-05-01 DIAGNOSIS — M62561 Muscle wasting and atrophy, not elsewhere classified, right lower leg: Secondary | ICD-10-CM | POA: Diagnosis not present

## 2024-05-01 DIAGNOSIS — R41841 Cognitive communication deficit: Secondary | ICD-10-CM | POA: Diagnosis not present

## 2024-05-02 DIAGNOSIS — R41841 Cognitive communication deficit: Secondary | ICD-10-CM | POA: Diagnosis not present

## 2024-05-02 DIAGNOSIS — M6289 Other specified disorders of muscle: Secondary | ICD-10-CM | POA: Diagnosis not present

## 2024-05-02 DIAGNOSIS — M62561 Muscle wasting and atrophy, not elsewhere classified, right lower leg: Secondary | ICD-10-CM | POA: Diagnosis not present

## 2024-05-02 DIAGNOSIS — R2689 Other abnormalities of gait and mobility: Secondary | ICD-10-CM | POA: Diagnosis not present

## 2024-05-02 DIAGNOSIS — F01A0 Vascular dementia, mild, without behavioral disturbance, psychotic disturbance, mood disturbance, and anxiety: Secondary | ICD-10-CM | POA: Diagnosis not present

## 2024-05-02 DIAGNOSIS — I483 Typical atrial flutter: Secondary | ICD-10-CM | POA: Diagnosis not present

## 2024-05-03 DIAGNOSIS — R41841 Cognitive communication deficit: Secondary | ICD-10-CM | POA: Diagnosis not present

## 2024-05-03 DIAGNOSIS — F01A0 Vascular dementia, mild, without behavioral disturbance, psychotic disturbance, mood disturbance, and anxiety: Secondary | ICD-10-CM | POA: Diagnosis not present

## 2024-05-03 DIAGNOSIS — M62561 Muscle wasting and atrophy, not elsewhere classified, right lower leg: Secondary | ICD-10-CM | POA: Diagnosis not present

## 2024-05-03 DIAGNOSIS — R2689 Other abnormalities of gait and mobility: Secondary | ICD-10-CM | POA: Diagnosis not present

## 2024-05-03 DIAGNOSIS — I483 Typical atrial flutter: Secondary | ICD-10-CM | POA: Diagnosis not present

## 2024-05-03 DIAGNOSIS — M6289 Other specified disorders of muscle: Secondary | ICD-10-CM | POA: Diagnosis not present

## 2024-05-04 DIAGNOSIS — M6289 Other specified disorders of muscle: Secondary | ICD-10-CM | POA: Diagnosis not present

## 2024-05-04 DIAGNOSIS — R2689 Other abnormalities of gait and mobility: Secondary | ICD-10-CM | POA: Diagnosis not present

## 2024-05-04 DIAGNOSIS — I483 Typical atrial flutter: Secondary | ICD-10-CM | POA: Diagnosis not present

## 2024-05-04 DIAGNOSIS — M62561 Muscle wasting and atrophy, not elsewhere classified, right lower leg: Secondary | ICD-10-CM | POA: Diagnosis not present

## 2024-05-04 DIAGNOSIS — R41841 Cognitive communication deficit: Secondary | ICD-10-CM | POA: Diagnosis not present

## 2024-05-04 DIAGNOSIS — F01A0 Vascular dementia, mild, without behavioral disturbance, psychotic disturbance, mood disturbance, and anxiety: Secondary | ICD-10-CM | POA: Diagnosis not present

## 2024-05-05 DIAGNOSIS — M6289 Other specified disorders of muscle: Secondary | ICD-10-CM | POA: Diagnosis not present

## 2024-05-05 DIAGNOSIS — F01A0 Vascular dementia, mild, without behavioral disturbance, psychotic disturbance, mood disturbance, and anxiety: Secondary | ICD-10-CM | POA: Diagnosis not present

## 2024-05-05 DIAGNOSIS — R41841 Cognitive communication deficit: Secondary | ICD-10-CM | POA: Diagnosis not present

## 2024-05-05 DIAGNOSIS — I483 Typical atrial flutter: Secondary | ICD-10-CM | POA: Diagnosis not present

## 2024-05-05 DIAGNOSIS — R2689 Other abnormalities of gait and mobility: Secondary | ICD-10-CM | POA: Diagnosis not present

## 2024-05-05 DIAGNOSIS — M62561 Muscle wasting and atrophy, not elsewhere classified, right lower leg: Secondary | ICD-10-CM | POA: Diagnosis not present

## 2024-05-06 DIAGNOSIS — M6289 Other specified disorders of muscle: Secondary | ICD-10-CM | POA: Diagnosis not present

## 2024-05-06 DIAGNOSIS — M62561 Muscle wasting and atrophy, not elsewhere classified, right lower leg: Secondary | ICD-10-CM | POA: Diagnosis not present

## 2024-05-06 DIAGNOSIS — R2689 Other abnormalities of gait and mobility: Secondary | ICD-10-CM | POA: Diagnosis not present

## 2024-05-06 DIAGNOSIS — I483 Typical atrial flutter: Secondary | ICD-10-CM | POA: Diagnosis not present

## 2024-05-06 DIAGNOSIS — F01A0 Vascular dementia, mild, without behavioral disturbance, psychotic disturbance, mood disturbance, and anxiety: Secondary | ICD-10-CM | POA: Diagnosis not present

## 2024-05-06 DIAGNOSIS — R41841 Cognitive communication deficit: Secondary | ICD-10-CM | POA: Diagnosis not present

## 2024-05-07 DIAGNOSIS — R41841 Cognitive communication deficit: Secondary | ICD-10-CM | POA: Diagnosis not present

## 2024-05-07 DIAGNOSIS — M62561 Muscle wasting and atrophy, not elsewhere classified, right lower leg: Secondary | ICD-10-CM | POA: Diagnosis not present

## 2024-05-07 DIAGNOSIS — I483 Typical atrial flutter: Secondary | ICD-10-CM | POA: Diagnosis not present

## 2024-05-07 DIAGNOSIS — R2689 Other abnormalities of gait and mobility: Secondary | ICD-10-CM | POA: Diagnosis not present

## 2024-05-07 DIAGNOSIS — F01A0 Vascular dementia, mild, without behavioral disturbance, psychotic disturbance, mood disturbance, and anxiety: Secondary | ICD-10-CM | POA: Diagnosis not present

## 2024-05-07 DIAGNOSIS — M6289 Other specified disorders of muscle: Secondary | ICD-10-CM | POA: Diagnosis not present

## 2024-05-09 DIAGNOSIS — R41841 Cognitive communication deficit: Secondary | ICD-10-CM | POA: Diagnosis not present

## 2024-05-09 DIAGNOSIS — M6289 Other specified disorders of muscle: Secondary | ICD-10-CM | POA: Diagnosis not present

## 2024-05-09 DIAGNOSIS — I483 Typical atrial flutter: Secondary | ICD-10-CM | POA: Diagnosis not present

## 2024-05-09 DIAGNOSIS — R2689 Other abnormalities of gait and mobility: Secondary | ICD-10-CM | POA: Diagnosis not present

## 2024-05-09 DIAGNOSIS — F01A0 Vascular dementia, mild, without behavioral disturbance, psychotic disturbance, mood disturbance, and anxiety: Secondary | ICD-10-CM | POA: Diagnosis not present

## 2024-05-09 DIAGNOSIS — M62561 Muscle wasting and atrophy, not elsewhere classified, right lower leg: Secondary | ICD-10-CM | POA: Diagnosis not present

## 2024-05-10 DIAGNOSIS — I483 Typical atrial flutter: Secondary | ICD-10-CM | POA: Diagnosis not present

## 2024-05-10 DIAGNOSIS — R41841 Cognitive communication deficit: Secondary | ICD-10-CM | POA: Diagnosis not present

## 2024-05-10 DIAGNOSIS — F01A0 Vascular dementia, mild, without behavioral disturbance, psychotic disturbance, mood disturbance, and anxiety: Secondary | ICD-10-CM | POA: Diagnosis not present

## 2024-05-10 DIAGNOSIS — M62561 Muscle wasting and atrophy, not elsewhere classified, right lower leg: Secondary | ICD-10-CM | POA: Diagnosis not present

## 2024-05-10 DIAGNOSIS — R2689 Other abnormalities of gait and mobility: Secondary | ICD-10-CM | POA: Diagnosis not present

## 2024-05-10 DIAGNOSIS — M6289 Other specified disorders of muscle: Secondary | ICD-10-CM | POA: Diagnosis not present

## 2024-05-11 DIAGNOSIS — F01A0 Vascular dementia, mild, without behavioral disturbance, psychotic disturbance, mood disturbance, and anxiety: Secondary | ICD-10-CM | POA: Diagnosis not present

## 2024-05-11 DIAGNOSIS — M6289 Other specified disorders of muscle: Secondary | ICD-10-CM | POA: Diagnosis not present

## 2024-05-11 DIAGNOSIS — R2689 Other abnormalities of gait and mobility: Secondary | ICD-10-CM | POA: Diagnosis not present

## 2024-05-11 DIAGNOSIS — M62561 Muscle wasting and atrophy, not elsewhere classified, right lower leg: Secondary | ICD-10-CM | POA: Diagnosis not present

## 2024-05-11 DIAGNOSIS — I483 Typical atrial flutter: Secondary | ICD-10-CM | POA: Diagnosis not present

## 2024-05-11 DIAGNOSIS — R41841 Cognitive communication deficit: Secondary | ICD-10-CM | POA: Diagnosis not present

## 2024-05-12 DIAGNOSIS — I483 Typical atrial flutter: Secondary | ICD-10-CM | POA: Diagnosis not present

## 2024-05-12 DIAGNOSIS — M62561 Muscle wasting and atrophy, not elsewhere classified, right lower leg: Secondary | ICD-10-CM | POA: Diagnosis not present

## 2024-05-12 DIAGNOSIS — F01A0 Vascular dementia, mild, without behavioral disturbance, psychotic disturbance, mood disturbance, and anxiety: Secondary | ICD-10-CM | POA: Diagnosis not present

## 2024-05-12 DIAGNOSIS — M6289 Other specified disorders of muscle: Secondary | ICD-10-CM | POA: Diagnosis not present

## 2024-05-12 DIAGNOSIS — R2689 Other abnormalities of gait and mobility: Secondary | ICD-10-CM | POA: Diagnosis not present

## 2024-05-12 DIAGNOSIS — R41841 Cognitive communication deficit: Secondary | ICD-10-CM | POA: Diagnosis not present

## 2024-05-13 DIAGNOSIS — M6289 Other specified disorders of muscle: Secondary | ICD-10-CM | POA: Diagnosis not present

## 2024-05-13 DIAGNOSIS — I483 Typical atrial flutter: Secondary | ICD-10-CM | POA: Diagnosis not present

## 2024-05-13 DIAGNOSIS — R2689 Other abnormalities of gait and mobility: Secondary | ICD-10-CM | POA: Diagnosis not present

## 2024-05-13 DIAGNOSIS — R41841 Cognitive communication deficit: Secondary | ICD-10-CM | POA: Diagnosis not present

## 2024-05-13 DIAGNOSIS — M62561 Muscle wasting and atrophy, not elsewhere classified, right lower leg: Secondary | ICD-10-CM | POA: Diagnosis not present

## 2024-05-13 DIAGNOSIS — F01A0 Vascular dementia, mild, without behavioral disturbance, psychotic disturbance, mood disturbance, and anxiety: Secondary | ICD-10-CM | POA: Diagnosis not present

## 2024-05-16 DIAGNOSIS — I483 Typical atrial flutter: Secondary | ICD-10-CM | POA: Diagnosis not present

## 2024-05-16 DIAGNOSIS — R2689 Other abnormalities of gait and mobility: Secondary | ICD-10-CM | POA: Diagnosis not present

## 2024-05-16 DIAGNOSIS — M62561 Muscle wasting and atrophy, not elsewhere classified, right lower leg: Secondary | ICD-10-CM | POA: Diagnosis not present

## 2024-05-16 DIAGNOSIS — R41841 Cognitive communication deficit: Secondary | ICD-10-CM | POA: Diagnosis not present

## 2024-05-16 DIAGNOSIS — M6289 Other specified disorders of muscle: Secondary | ICD-10-CM | POA: Diagnosis not present

## 2024-05-16 DIAGNOSIS — F01A0 Vascular dementia, mild, without behavioral disturbance, psychotic disturbance, mood disturbance, and anxiety: Secondary | ICD-10-CM | POA: Diagnosis not present

## 2024-05-17 DIAGNOSIS — I483 Typical atrial flutter: Secondary | ICD-10-CM | POA: Diagnosis not present

## 2024-05-17 DIAGNOSIS — M62561 Muscle wasting and atrophy, not elsewhere classified, right lower leg: Secondary | ICD-10-CM | POA: Diagnosis not present

## 2024-05-17 DIAGNOSIS — R2689 Other abnormalities of gait and mobility: Secondary | ICD-10-CM | POA: Diagnosis not present

## 2024-05-17 DIAGNOSIS — M6289 Other specified disorders of muscle: Secondary | ICD-10-CM | POA: Diagnosis not present

## 2024-05-17 DIAGNOSIS — F01A0 Vascular dementia, mild, without behavioral disturbance, psychotic disturbance, mood disturbance, and anxiety: Secondary | ICD-10-CM | POA: Diagnosis not present

## 2024-05-17 DIAGNOSIS — R41841 Cognitive communication deficit: Secondary | ICD-10-CM | POA: Diagnosis not present

## 2024-05-18 DIAGNOSIS — R41841 Cognitive communication deficit: Secondary | ICD-10-CM | POA: Diagnosis not present

## 2024-05-18 DIAGNOSIS — M62561 Muscle wasting and atrophy, not elsewhere classified, right lower leg: Secondary | ICD-10-CM | POA: Diagnosis not present

## 2024-05-18 DIAGNOSIS — I483 Typical atrial flutter: Secondary | ICD-10-CM | POA: Diagnosis not present

## 2024-05-18 DIAGNOSIS — M6289 Other specified disorders of muscle: Secondary | ICD-10-CM | POA: Diagnosis not present

## 2024-05-18 DIAGNOSIS — F01A0 Vascular dementia, mild, without behavioral disturbance, psychotic disturbance, mood disturbance, and anxiety: Secondary | ICD-10-CM | POA: Diagnosis not present

## 2024-05-18 DIAGNOSIS — R2689 Other abnormalities of gait and mobility: Secondary | ICD-10-CM | POA: Diagnosis not present

## 2024-05-19 DIAGNOSIS — I483 Typical atrial flutter: Secondary | ICD-10-CM | POA: Diagnosis not present

## 2024-05-19 DIAGNOSIS — R41841 Cognitive communication deficit: Secondary | ICD-10-CM | POA: Diagnosis not present

## 2024-05-19 DIAGNOSIS — F01A0 Vascular dementia, mild, without behavioral disturbance, psychotic disturbance, mood disturbance, and anxiety: Secondary | ICD-10-CM | POA: Diagnosis not present

## 2024-05-19 DIAGNOSIS — R2689 Other abnormalities of gait and mobility: Secondary | ICD-10-CM | POA: Diagnosis not present

## 2024-05-19 DIAGNOSIS — M62561 Muscle wasting and atrophy, not elsewhere classified, right lower leg: Secondary | ICD-10-CM | POA: Diagnosis not present

## 2024-05-19 DIAGNOSIS — M6289 Other specified disorders of muscle: Secondary | ICD-10-CM | POA: Diagnosis not present

## 2024-05-20 ENCOUNTER — Ambulatory Visit: Payer: Self-pay | Admitting: Cardiology

## 2024-05-20 ENCOUNTER — Ambulatory Visit (INDEPENDENT_AMBULATORY_CARE_PROVIDER_SITE_OTHER)

## 2024-05-20 DIAGNOSIS — M6289 Other specified disorders of muscle: Secondary | ICD-10-CM | POA: Diagnosis not present

## 2024-05-20 DIAGNOSIS — R41841 Cognitive communication deficit: Secondary | ICD-10-CM | POA: Diagnosis not present

## 2024-05-20 DIAGNOSIS — I483 Typical atrial flutter: Secondary | ICD-10-CM | POA: Diagnosis not present

## 2024-05-20 DIAGNOSIS — I4891 Unspecified atrial fibrillation: Secondary | ICD-10-CM | POA: Diagnosis not present

## 2024-05-20 DIAGNOSIS — F01A0 Vascular dementia, mild, without behavioral disturbance, psychotic disturbance, mood disturbance, and anxiety: Secondary | ICD-10-CM | POA: Diagnosis not present

## 2024-05-20 DIAGNOSIS — I4892 Unspecified atrial flutter: Secondary | ICD-10-CM | POA: Diagnosis not present

## 2024-05-20 DIAGNOSIS — M62561 Muscle wasting and atrophy, not elsewhere classified, right lower leg: Secondary | ICD-10-CM | POA: Diagnosis not present

## 2024-05-20 DIAGNOSIS — R2689 Other abnormalities of gait and mobility: Secondary | ICD-10-CM | POA: Diagnosis not present

## 2024-05-20 LAB — CUP PACEART REMOTE DEVICE CHECK
Battery Remaining Longevity: 129 mo
Battery Voltage: 3.2 V
Brady Statistic AP VP Percent: 55.07 %
Brady Statistic AP VS Percent: 0.17 %
Brady Statistic AS VP Percent: 41.38 %
Brady Statistic AS VS Percent: 3.39 %
Brady Statistic RA Percent Paced: 56.04 %
Brady Statistic RV Percent Paced: 96.44 %
Date Time Interrogation Session: 20250627013733
Implantable Lead Connection Status: 753985
Implantable Lead Connection Status: 753985
Implantable Lead Implant Date: 20250515
Implantable Lead Implant Date: 20250515
Implantable Lead Location: 753859
Implantable Lead Location: 753860
Implantable Lead Model: 3830
Implantable Lead Model: 5076
Implantable Pulse Generator Implant Date: 20250515
Lead Channel Impedance Value: 342 Ohm
Lead Channel Impedance Value: 380 Ohm
Lead Channel Impedance Value: 494 Ohm
Lead Channel Impedance Value: 513 Ohm
Lead Channel Pacing Threshold Amplitude: 0.625 V
Lead Channel Pacing Threshold Amplitude: 0.75 V
Lead Channel Pacing Threshold Pulse Width: 0.4 ms
Lead Channel Pacing Threshold Pulse Width: 0.4 ms
Lead Channel Sensing Intrinsic Amplitude: 2.125 mV
Lead Channel Sensing Intrinsic Amplitude: 2.125 mV
Lead Channel Sensing Intrinsic Amplitude: 27.875 mV
Lead Channel Sensing Intrinsic Amplitude: 27.875 mV
Lead Channel Setting Pacing Amplitude: 3.5 V
Lead Channel Setting Pacing Amplitude: 3.5 V
Lead Channel Setting Pacing Pulse Width: 0.4 ms
Lead Channel Setting Sensing Sensitivity: 0.9 mV
Zone Setting Status: 755011
Zone Setting Status: 755011

## 2024-05-23 DIAGNOSIS — R2689 Other abnormalities of gait and mobility: Secondary | ICD-10-CM | POA: Diagnosis not present

## 2024-05-23 DIAGNOSIS — F01A0 Vascular dementia, mild, without behavioral disturbance, psychotic disturbance, mood disturbance, and anxiety: Secondary | ICD-10-CM | POA: Diagnosis not present

## 2024-05-23 DIAGNOSIS — R41841 Cognitive communication deficit: Secondary | ICD-10-CM | POA: Diagnosis not present

## 2024-05-23 DIAGNOSIS — M62561 Muscle wasting and atrophy, not elsewhere classified, right lower leg: Secondary | ICD-10-CM | POA: Diagnosis not present

## 2024-05-23 DIAGNOSIS — I483 Typical atrial flutter: Secondary | ICD-10-CM | POA: Diagnosis not present

## 2024-05-23 DIAGNOSIS — M6289 Other specified disorders of muscle: Secondary | ICD-10-CM | POA: Diagnosis not present

## 2024-05-24 ENCOUNTER — Telehealth: Payer: Self-pay | Admitting: Internal Medicine

## 2024-05-24 DIAGNOSIS — R41841 Cognitive communication deficit: Secondary | ICD-10-CM | POA: Diagnosis not present

## 2024-05-24 DIAGNOSIS — M62562 Muscle wasting and atrophy, not elsewhere classified, left lower leg: Secondary | ICD-10-CM | POA: Diagnosis not present

## 2024-05-24 DIAGNOSIS — M6289 Other specified disorders of muscle: Secondary | ICD-10-CM | POA: Diagnosis not present

## 2024-05-24 DIAGNOSIS — Z950 Presence of cardiac pacemaker: Secondary | ICD-10-CM | POA: Diagnosis not present

## 2024-05-24 DIAGNOSIS — M62561 Muscle wasting and atrophy, not elsewhere classified, right lower leg: Secondary | ICD-10-CM | POA: Diagnosis not present

## 2024-05-24 DIAGNOSIS — R2689 Other abnormalities of gait and mobility: Secondary | ICD-10-CM | POA: Diagnosis not present

## 2024-05-24 DIAGNOSIS — F01A0 Vascular dementia, mild, without behavioral disturbance, psychotic disturbance, mood disturbance, and anxiety: Secondary | ICD-10-CM | POA: Diagnosis not present

## 2024-05-24 DIAGNOSIS — I483 Typical atrial flutter: Secondary | ICD-10-CM | POA: Diagnosis not present

## 2024-05-24 DIAGNOSIS — I2481 Acute coronary microvascular dysfunction: Secondary | ICD-10-CM | POA: Diagnosis not present

## 2024-05-24 DIAGNOSIS — Z9181 History of falling: Secondary | ICD-10-CM | POA: Diagnosis not present

## 2024-05-24 NOTE — Telephone Encounter (Signed)
 [facility]Forms placed in the provider's folder need to be filled out.  1st notification: information is needed within 30 days.

## 2024-05-25 DIAGNOSIS — I483 Typical atrial flutter: Secondary | ICD-10-CM | POA: Diagnosis not present

## 2024-05-25 DIAGNOSIS — M62561 Muscle wasting and atrophy, not elsewhere classified, right lower leg: Secondary | ICD-10-CM | POA: Diagnosis not present

## 2024-05-25 DIAGNOSIS — R41841 Cognitive communication deficit: Secondary | ICD-10-CM | POA: Diagnosis not present

## 2024-05-25 DIAGNOSIS — R2689 Other abnormalities of gait and mobility: Secondary | ICD-10-CM | POA: Diagnosis not present

## 2024-05-25 DIAGNOSIS — M6289 Other specified disorders of muscle: Secondary | ICD-10-CM | POA: Diagnosis not present

## 2024-05-25 DIAGNOSIS — F01A0 Vascular dementia, mild, without behavioral disturbance, psychotic disturbance, mood disturbance, and anxiety: Secondary | ICD-10-CM | POA: Diagnosis not present

## 2024-05-26 DIAGNOSIS — R2689 Other abnormalities of gait and mobility: Secondary | ICD-10-CM | POA: Diagnosis not present

## 2024-05-26 DIAGNOSIS — M62561 Muscle wasting and atrophy, not elsewhere classified, right lower leg: Secondary | ICD-10-CM | POA: Diagnosis not present

## 2024-05-26 DIAGNOSIS — M6289 Other specified disorders of muscle: Secondary | ICD-10-CM | POA: Diagnosis not present

## 2024-05-26 DIAGNOSIS — F01A0 Vascular dementia, mild, without behavioral disturbance, psychotic disturbance, mood disturbance, and anxiety: Secondary | ICD-10-CM | POA: Diagnosis not present

## 2024-05-26 DIAGNOSIS — I483 Typical atrial flutter: Secondary | ICD-10-CM | POA: Diagnosis not present

## 2024-05-26 DIAGNOSIS — R41841 Cognitive communication deficit: Secondary | ICD-10-CM | POA: Diagnosis not present

## 2024-05-27 DIAGNOSIS — F01A0 Vascular dementia, mild, without behavioral disturbance, psychotic disturbance, mood disturbance, and anxiety: Secondary | ICD-10-CM | POA: Diagnosis not present

## 2024-05-27 DIAGNOSIS — R41841 Cognitive communication deficit: Secondary | ICD-10-CM | POA: Diagnosis not present

## 2024-05-27 DIAGNOSIS — I483 Typical atrial flutter: Secondary | ICD-10-CM | POA: Diagnosis not present

## 2024-05-27 DIAGNOSIS — M62561 Muscle wasting and atrophy, not elsewhere classified, right lower leg: Secondary | ICD-10-CM | POA: Diagnosis not present

## 2024-05-27 DIAGNOSIS — M6289 Other specified disorders of muscle: Secondary | ICD-10-CM | POA: Diagnosis not present

## 2024-05-27 DIAGNOSIS — R2689 Other abnormalities of gait and mobility: Secondary | ICD-10-CM | POA: Diagnosis not present

## 2024-05-29 DIAGNOSIS — M6289 Other specified disorders of muscle: Secondary | ICD-10-CM | POA: Diagnosis not present

## 2024-05-29 DIAGNOSIS — M62561 Muscle wasting and atrophy, not elsewhere classified, right lower leg: Secondary | ICD-10-CM | POA: Diagnosis not present

## 2024-05-29 DIAGNOSIS — R2689 Other abnormalities of gait and mobility: Secondary | ICD-10-CM | POA: Diagnosis not present

## 2024-05-29 DIAGNOSIS — F01A0 Vascular dementia, mild, without behavioral disturbance, psychotic disturbance, mood disturbance, and anxiety: Secondary | ICD-10-CM | POA: Diagnosis not present

## 2024-05-29 DIAGNOSIS — I483 Typical atrial flutter: Secondary | ICD-10-CM | POA: Diagnosis not present

## 2024-05-29 DIAGNOSIS — R41841 Cognitive communication deficit: Secondary | ICD-10-CM | POA: Diagnosis not present

## 2024-05-30 ENCOUNTER — Encounter: Admitting: Adult Health

## 2024-05-30 DIAGNOSIS — R41841 Cognitive communication deficit: Secondary | ICD-10-CM | POA: Diagnosis not present

## 2024-05-30 DIAGNOSIS — F01A0 Vascular dementia, mild, without behavioral disturbance, psychotic disturbance, mood disturbance, and anxiety: Secondary | ICD-10-CM | POA: Diagnosis not present

## 2024-05-30 DIAGNOSIS — M6289 Other specified disorders of muscle: Secondary | ICD-10-CM | POA: Diagnosis not present

## 2024-05-30 DIAGNOSIS — I483 Typical atrial flutter: Secondary | ICD-10-CM | POA: Diagnosis not present

## 2024-05-30 DIAGNOSIS — M62561 Muscle wasting and atrophy, not elsewhere classified, right lower leg: Secondary | ICD-10-CM | POA: Diagnosis not present

## 2024-05-30 DIAGNOSIS — R2689 Other abnormalities of gait and mobility: Secondary | ICD-10-CM | POA: Diagnosis not present

## 2024-05-31 DIAGNOSIS — I483 Typical atrial flutter: Secondary | ICD-10-CM | POA: Diagnosis not present

## 2024-05-31 DIAGNOSIS — M62561 Muscle wasting and atrophy, not elsewhere classified, right lower leg: Secondary | ICD-10-CM | POA: Diagnosis not present

## 2024-05-31 DIAGNOSIS — F01A0 Vascular dementia, mild, without behavioral disturbance, psychotic disturbance, mood disturbance, and anxiety: Secondary | ICD-10-CM | POA: Diagnosis not present

## 2024-05-31 DIAGNOSIS — R2689 Other abnormalities of gait and mobility: Secondary | ICD-10-CM | POA: Diagnosis not present

## 2024-05-31 DIAGNOSIS — M6289 Other specified disorders of muscle: Secondary | ICD-10-CM | POA: Diagnosis not present

## 2024-05-31 DIAGNOSIS — R41841 Cognitive communication deficit: Secondary | ICD-10-CM | POA: Diagnosis not present

## 2024-06-01 DIAGNOSIS — F01A0 Vascular dementia, mild, without behavioral disturbance, psychotic disturbance, mood disturbance, and anxiety: Secondary | ICD-10-CM | POA: Diagnosis not present

## 2024-06-01 DIAGNOSIS — I483 Typical atrial flutter: Secondary | ICD-10-CM | POA: Diagnosis not present

## 2024-06-01 DIAGNOSIS — R41841 Cognitive communication deficit: Secondary | ICD-10-CM | POA: Diagnosis not present

## 2024-06-01 DIAGNOSIS — R2689 Other abnormalities of gait and mobility: Secondary | ICD-10-CM | POA: Diagnosis not present

## 2024-06-01 DIAGNOSIS — M62561 Muscle wasting and atrophy, not elsewhere classified, right lower leg: Secondary | ICD-10-CM | POA: Diagnosis not present

## 2024-06-01 DIAGNOSIS — M6289 Other specified disorders of muscle: Secondary | ICD-10-CM | POA: Diagnosis not present

## 2024-06-02 ENCOUNTER — Non-Acute Institutional Stay (SKILLED_NURSING_FACILITY): Payer: Self-pay | Admitting: Adult Health

## 2024-06-02 ENCOUNTER — Encounter: Payer: Self-pay | Admitting: Adult Health

## 2024-06-02 DIAGNOSIS — E782 Mixed hyperlipidemia: Secondary | ICD-10-CM | POA: Diagnosis not present

## 2024-06-02 DIAGNOSIS — N401 Enlarged prostate with lower urinary tract symptoms: Secondary | ICD-10-CM | POA: Diagnosis not present

## 2024-06-02 DIAGNOSIS — R3914 Feeling of incomplete bladder emptying: Secondary | ICD-10-CM

## 2024-06-02 DIAGNOSIS — R001 Bradycardia, unspecified: Secondary | ICD-10-CM | POA: Diagnosis not present

## 2024-06-02 DIAGNOSIS — F0150 Vascular dementia without behavioral disturbance: Secondary | ICD-10-CM

## 2024-06-02 DIAGNOSIS — R41841 Cognitive communication deficit: Secondary | ICD-10-CM | POA: Diagnosis not present

## 2024-06-02 DIAGNOSIS — R2681 Unsteadiness on feet: Secondary | ICD-10-CM | POA: Diagnosis not present

## 2024-06-02 DIAGNOSIS — F01A0 Vascular dementia, mild, without behavioral disturbance, psychotic disturbance, mood disturbance, and anxiety: Secondary | ICD-10-CM | POA: Diagnosis not present

## 2024-06-02 DIAGNOSIS — M62561 Muscle wasting and atrophy, not elsewhere classified, right lower leg: Secondary | ICD-10-CM | POA: Diagnosis not present

## 2024-06-02 DIAGNOSIS — R2689 Other abnormalities of gait and mobility: Secondary | ICD-10-CM | POA: Diagnosis not present

## 2024-06-02 DIAGNOSIS — I483 Typical atrial flutter: Secondary | ICD-10-CM | POA: Diagnosis not present

## 2024-06-02 DIAGNOSIS — I10 Essential (primary) hypertension: Secondary | ICD-10-CM | POA: Diagnosis not present

## 2024-06-02 DIAGNOSIS — M170 Bilateral primary osteoarthritis of knee: Secondary | ICD-10-CM

## 2024-06-02 DIAGNOSIS — M6289 Other specified disorders of muscle: Secondary | ICD-10-CM | POA: Diagnosis not present

## 2024-06-02 NOTE — Progress Notes (Addendum)
 Location:  [facility] Room Number: 157 A Place of Service:  SNF [phone]) Provider:  Tawni America, NP    Patient Care Team: Charlanne Fredia CROME, MD as PCP - General (Internal Medicine) Thukkani, Arun K, MD as PCP - Cardiology (Cardiology)  Extended Emergency Contact Information Primary Emergency Contact: Salminen,BETTY A Address: [address]          [place], [place] [address] [place] Home Phone: [phone] Relation: Spouse Secondary Emergency Contact: Anstine,Cindy Mobile Phone: [phone] Relation: Daughter  Code Status:  DNR Goals of care: Advanced Directive information    04/26/2024    3:48 PM  Advanced Directives  Does Patient Have a Medical Advance Directive? Yes  Type of Advance Directive Living will;Out of facility DNR (pink MOST or yellow form)  Does patient want to make changes to medical advance directive? No - Patient declined     Chief Complaint  Patient presents with   Routine Visit    HPI:  Pt is a 89 y.o. male seen today for medical management of chronic diseases.   He was hospitalized from Apr 05, 2024, to Apr 08, 2024, following a fall without loss of consciousness. During the emergency department visit, he was found to have an irregular rhythm and bradycardia, leading to the placement of a pacemaker on Apr 07, 2024. He is not experiencing palpitations or shortness of breath since the pacemaker placement.  He experiences generalized weakness and intermittently requires a lift for transfers but can walk with a walker at times. He can usually get up on his own but sometimes struggles due to technique. He prefers to manage independently without assistance. He has some incontinence and needs help with lower body dressing.  He has a history of benign prostatic hyperplasia (BPH) and is scheduled for a cystoscopy on June 10, 2024 due to hematuria and UTI.  He was treated for a cough on April 26, 2024, with a chest  x-ray showing possible pneumonia, and was placed on doxycycline  for seven days. Symptoms have mostly resolved, though he still has a slight cough and congestion with sputum production.  He experiences chronic right hip and knee  pain and is on scheduled Tylenol  twice a day for arthritic pain. He reports a recent episode of left knee pain causing cramps in his lower leg, but currently feels pain-free after repositioning his leg.  He has vascular dementia with an MMSE score of 22 out of 30 as of Apr 10, 2024, and experiences some confusion but remains verbal with no behavioral concerns.  He is planning a move to skilled care, which will be closer to his wife.  Past Surgical History:  Procedure Laterality Date   INNER EAR SURGERY     PACEMAKER IMPLANT N/A 04/07/2024   Procedure: PACEMAKER IMPLANT;  Surgeon: Cindie Ole DASEN, MD;  Location: [facility] INVASIVE CV LAB;  Service: Cardiovascular;  Laterality: N/A;   SQUAMOUS CELL CARCINOMA EXCISION     on chest    Allergies  Allergen Reactions   Atorvastatin Other (See Comments)    Myalgia    Outpatient Encounter Medications as of 06/02/2024  Medication Sig   acetaminophen  (TYLENOL ) 500 MG tablet Take 1,000 mg by mouth 2 (two) times daily. May take one additional 1000mg  dose as needed for pain   Dextromethorphan-guaiFENesin (ROBAFEN DM [facility]/CHEST CONGEST PO) Take 10 mLs by mouth every 6 (six) hours as needed. 10 mL, oral, Every 6 Hours - PRN, Robafen DM (avoid if pt has high  blood pressure) 10mL p.o. q 6 hours PRN cough and congestion x 2 days. If persists > 2 days or fever over 100,notify provider.   diclofenac  Sodium (VOLTAREN ) 1 % GEL Apply 4 g topically in the morning, at noon, and at bedtime.   ezetimibe  (ZETIA ) 10 MG tablet Take 10 mg by mouth daily.   finasteride  (PROSCAR ) 5 MG tablet Take 5 mg by mouth in the morning.   fluticasone  (FLONASE ) 50 MCG/ACT nasal spray Place 1 spray into both nostrils 2 (two) times daily as needed for allergies or  rhinitis.   galantamine  (RAZADYNE  ER) 8 MG 24 hr capsule Take 8 mg by mouth daily with breakfast.   ipratropium-albuterol (DUONEB) 0.5-2.5 (3) MG/3ML SOLN Take 3 mLs by nebulization 2 (two) times daily as needed. 1, inhalation, Twice A Day, Duonebs BID x 2 days, then BID PRN x 10 days for wheezing   losartan  (COZAAR ) 25 MG tablet Take 25 mg by mouth every morning.   Multiple Vitamins-Minerals (CENTRUM SILVER) CHEW Chew 1 tablet by mouth in the morning.   simvastatin  (ZOCOR ) 40 MG tablet Take 40 mg by mouth every evening.   tamsulosin  (FLOMAX ) 0.4 MG CAPS capsule Take 0.4 mg by mouth daily.   triamcinolone  ointment (KENALOG ) 0.1 % Apply 1 Application topically 2 (two) times daily as needed.   zinc oxide (ENDIT) 20 % ointment Apply 1 Application topically every 8 (eight) hours as needed for irritation.   guaiFENesin (MUCINEX) 600 MG 12 hr tablet Take 600 mg by mouth 2 (two) times daily. 600mg , oral, Twice A Day, Mucinex 600 mg po BID for 2 days. If persists> 2 days or fever over 100, notify provider. (Patient not taking: Reported on 06/02/2024)   No facility-administered encounter medications on file as of 06/02/2024.    Review of Systems  Constitutional:  Positive for activity change. Negative for appetite change, chills, diaphoresis, fatigue, fever and unexpected weight change.  Respiratory:  Negative for cough, shortness of breath, wheezing and stridor.   Cardiovascular:  Positive for leg swelling. Negative for chest pain and palpitations.  Gastrointestinal:  Negative for abdominal distention, abdominal pain, constipation and diarrhea.  Genitourinary:  Negative for difficulty urinating and dysuria.  Musculoskeletal:  Positive for arthralgias and gait problem. Negative for back pain, joint swelling and myalgias.  Neurological:  Negative for dizziness, seizures, syncope, facial asymmetry, speech difficulty, weakness and headaches.  Hematological:  Negative for adenopathy. Does not bruise/bleed  easily.  Psychiatric/Behavioral:  Positive for confusion. Negative for agitation and behavioral problems.     Immunization History  Administered Date(s) Administered   Fluad Trivalent(High Dose 65+) 09/15/2023   Influenza Split 03/28/2010, 09/24/2010, 12/17/2011, 02/28/2013, 08/15/2013, 08/23/2014, 08/05/2020   Influenza, High Dose Seasonal PF 08/15/2015, 07/29/2016, 08/25/2017   Influenza-Unspecified 08/04/2018, 07/14/2019   Moderna Covid-19 Vaccine  Bivalent Booster 45yrs & up 09/15/2023   Moderna SARS-COV2 Booster Vaccination 06/12/2021   PFIZER(Purple Top)SARS-COV-2 Vaccination 08/04/2019, 08/23/2019, 03/13/2020, 09/06/2021   PNEUMOCOCCAL CONJUGATE-20 12/20/2021   Pfizer Covid-19 Vaccine Bivalent Booster 46yrs & up 09/29/2022, 03/19/2023   Pneumococcal Polysaccharide-23 08/18/2007, 03/28/2010, 12/20/2021   Pneumococcal-Unspecified 02/28/2013   RSV,unspecified 11/25/2022   Td (Adult),5 Lf Tetanus Toxid, Preservative Free 09/14/2009   Tdap 09/14/2009, 03/28/2010   Tetanus 08/20/2023   Zoster Recombinant(Shingrix) 03/10/2018, 06/15/2018   Zoster, Live 03/28/2010, 12/17/2011, 03/10/2018, 10/07/2023   Pertinent  Health Maintenance Due  Topic Date Due   INFLUENZA VACCINE  06/24/2024      08/18/2023   10:02 AM 02/23/2024    1:46 PM  Fall Risk  Falls in the past year? 1 0  Was there an injury with Fall? 1 0  Fall Risk Category Calculator 3 0  Patient at Risk for Falls Due to History of fall(s);Impaired balance/gait;Impaired mobility Impaired balance/gait;Impaired mobility  Fall risk Follow up Falls evaluation completed Falls evaluation completed   Functional Status Survey:    Vitals:   06/02/24 0849  BP: (!) 148/72  Pulse: 72  Resp: 14  Temp: 97.6 F (36.4 C)  SpO2: 95%  Weight: 198 lb 6.4 oz (90 kg)  Height: 5' 9 (1.753 m)   Body mass index is 29.3 kg/m. Physical Exam Vitals and nursing note reviewed.  Constitutional:      Appearance: Normal appearance.  HENT:      Head: Normocephalic and atraumatic.     Mouth/Throat:     Pharynx: Oropharynx is clear.  Cardiovascular:     Rate and Rhythm: Normal rate. Rhythm irregular.     Heart sounds: No murmur heard. Pulmonary:     Effort: Pulmonary effort is normal. No respiratory distress.     Breath sounds: Normal breath sounds. No wheezing.  Abdominal:     General: Bowel sounds are normal. There is no distension.     Palpations: Abdomen is soft.     Tenderness: There is no abdominal tenderness.  Musculoskeletal:     Cervical back: Normal range of motion. No rigidity.     Right lower leg: No edema.     Left lower leg: No edema.     Comments: Trace ankle edema  Lymphadenopathy:     Cervical: No cervical adenopathy.  Skin:    General: Skin is warm and dry.     Comments: Steri strips to left chest Healed incision with no redness or drainage.   Neurological:     Mental Status: He is alert. Mental status is at baseline.  Psychiatric:        Mood and Affect: Mood normal.     Labs reviewed: Recent Labs    04/05/24 0549 04/05/24 1300 04/07/24 0539 04/12/24 0000  NA 140  --  138 139  K 4.3  --  4.1 5.1  CL 109  --  102 102  CO2 23  --  25 25*  GLUCOSE 127*  --  121*  --   BUN 17  --  15 18  CREATININE 1.06 0.97 1.02 1.0  CALCIUM 9.0  --  8.9 9.2  MG 2.2  --  2.2  --   PHOS  --   --  3.4  --    Recent Labs    11/19/23 0000  ALBUMIN 4.3   Recent Labs    04/05/24 0549 04/05/24 1300 04/07/24 0539 04/12/24 0000  WBC 10.3 9.0 11.2* 7.2  HGB 14.1 14.0 15.9 16.3  HCT 43.5 43.9 47.4 48  MCV 96.0 96.7 92.9  --   PLT 206 208 236 239   Lab Results  Component Value Date   TSH 4.478 04/05/2024   No results found for: HGBA1C Lab Results  Component Value Date   CHOL 116 11/19/2023   HDL 55 11/19/2023   LDLCALC 46 11/19/2023   TRIG 73 11/19/2023    Significant Diagnostic Results in last 30 days:  CUP PACEART REMOTE DEVICE CHECK Result Date: 05/20/2024 PPM scheduled remote  reviewed. Normal device function.  Presenting rhythm: AP-VP Next remote 91 days. AB, CVRS   Assessment/Plan  Gait instability Walking short distance with a walker and at times needs a  stand up lift Progressive weakness due to advancing dementia s/p hospitalization Fall risk Needs skilled care  Pacemaker follow-up Remote monitoring in place. -healing -ensure follow up  Afib/flutter Rate is controlled Followed by cardiology  Not on DOAC due to risk   HTN Increase losartan  to 50 mg  Repeat BMP to check K  Vascular dementia [place]  due to rash  MRI 01/15/24 moderate chronic microvascular ischemic disease and atrophy MMSE score 22/30. Confusion present, verbal, no behavioral issues. Galantamine    Chronic hip pain Managed with scheduled Tylenol . Currently pain-free, recent left knee pain episode. - Continue scheduled Tylenol  for arthritic pain.  Benign prostatic hyperplasia (BPH) Awaiting cystoscopy on June 10, 2024 due to hx of hematuria Remains on Proscar  and FLomax .  Perihilar pneumonia  Symptoms resolved post-doxycycline . Mild cough and sputum present but improved.  Consider repeat imaging due to adenopathy and questionable low attenuation area of the right lobe of the liver found on CT of the chest 04/05/24  Goals of Care Planning move to skilled care near wife.

## 2024-06-03 DIAGNOSIS — M6289 Other specified disorders of muscle: Secondary | ICD-10-CM | POA: Diagnosis not present

## 2024-06-03 DIAGNOSIS — R41841 Cognitive communication deficit: Secondary | ICD-10-CM | POA: Diagnosis not present

## 2024-06-03 DIAGNOSIS — F01A0 Vascular dementia, mild, without behavioral disturbance, psychotic disturbance, mood disturbance, and anxiety: Secondary | ICD-10-CM | POA: Diagnosis not present

## 2024-06-03 DIAGNOSIS — I483 Typical atrial flutter: Secondary | ICD-10-CM | POA: Diagnosis not present

## 2024-06-03 DIAGNOSIS — M62561 Muscle wasting and atrophy, not elsewhere classified, right lower leg: Secondary | ICD-10-CM | POA: Diagnosis not present

## 2024-06-03 DIAGNOSIS — R2689 Other abnormalities of gait and mobility: Secondary | ICD-10-CM | POA: Diagnosis not present

## 2024-06-04 DIAGNOSIS — I483 Typical atrial flutter: Secondary | ICD-10-CM | POA: Diagnosis not present

## 2024-06-04 DIAGNOSIS — M6289 Other specified disorders of muscle: Secondary | ICD-10-CM | POA: Diagnosis not present

## 2024-06-04 DIAGNOSIS — R41841 Cognitive communication deficit: Secondary | ICD-10-CM | POA: Diagnosis not present

## 2024-06-04 DIAGNOSIS — M62561 Muscle wasting and atrophy, not elsewhere classified, right lower leg: Secondary | ICD-10-CM | POA: Diagnosis not present

## 2024-06-04 DIAGNOSIS — F01A0 Vascular dementia, mild, without behavioral disturbance, psychotic disturbance, mood disturbance, and anxiety: Secondary | ICD-10-CM | POA: Diagnosis not present

## 2024-06-04 DIAGNOSIS — R2689 Other abnormalities of gait and mobility: Secondary | ICD-10-CM | POA: Diagnosis not present

## 2024-06-05 DIAGNOSIS — F01A0 Vascular dementia, mild, without behavioral disturbance, psychotic disturbance, mood disturbance, and anxiety: Secondary | ICD-10-CM | POA: Diagnosis not present

## 2024-06-05 DIAGNOSIS — M62561 Muscle wasting and atrophy, not elsewhere classified, right lower leg: Secondary | ICD-10-CM | POA: Diagnosis not present

## 2024-06-05 DIAGNOSIS — M6289 Other specified disorders of muscle: Secondary | ICD-10-CM | POA: Diagnosis not present

## 2024-06-05 DIAGNOSIS — I483 Typical atrial flutter: Secondary | ICD-10-CM | POA: Diagnosis not present

## 2024-06-05 DIAGNOSIS — R2689 Other abnormalities of gait and mobility: Secondary | ICD-10-CM | POA: Diagnosis not present

## 2024-06-05 DIAGNOSIS — R41841 Cognitive communication deficit: Secondary | ICD-10-CM | POA: Diagnosis not present

## 2024-06-06 DIAGNOSIS — R2689 Other abnormalities of gait and mobility: Secondary | ICD-10-CM | POA: Diagnosis not present

## 2024-06-06 DIAGNOSIS — R41841 Cognitive communication deficit: Secondary | ICD-10-CM | POA: Diagnosis not present

## 2024-06-06 DIAGNOSIS — M62561 Muscle wasting and atrophy, not elsewhere classified, right lower leg: Secondary | ICD-10-CM | POA: Diagnosis not present

## 2024-06-06 DIAGNOSIS — F01A0 Vascular dementia, mild, without behavioral disturbance, psychotic disturbance, mood disturbance, and anxiety: Secondary | ICD-10-CM | POA: Diagnosis not present

## 2024-06-06 DIAGNOSIS — M6289 Other specified disorders of muscle: Secondary | ICD-10-CM | POA: Diagnosis not present

## 2024-06-06 DIAGNOSIS — I483 Typical atrial flutter: Secondary | ICD-10-CM | POA: Diagnosis not present

## 2024-06-07 DIAGNOSIS — F01A0 Vascular dementia, mild, without behavioral disturbance, psychotic disturbance, mood disturbance, and anxiety: Secondary | ICD-10-CM | POA: Diagnosis not present

## 2024-06-07 DIAGNOSIS — I483 Typical atrial flutter: Secondary | ICD-10-CM | POA: Diagnosis not present

## 2024-06-07 DIAGNOSIS — R41841 Cognitive communication deficit: Secondary | ICD-10-CM | POA: Diagnosis not present

## 2024-06-07 DIAGNOSIS — M6289 Other specified disorders of muscle: Secondary | ICD-10-CM | POA: Diagnosis not present

## 2024-06-07 DIAGNOSIS — M62561 Muscle wasting and atrophy, not elsewhere classified, right lower leg: Secondary | ICD-10-CM | POA: Diagnosis not present

## 2024-06-07 DIAGNOSIS — R2689 Other abnormalities of gait and mobility: Secondary | ICD-10-CM | POA: Diagnosis not present

## 2024-06-08 DIAGNOSIS — F01A0 Vascular dementia, mild, without behavioral disturbance, psychotic disturbance, mood disturbance, and anxiety: Secondary | ICD-10-CM | POA: Diagnosis not present

## 2024-06-08 DIAGNOSIS — R2689 Other abnormalities of gait and mobility: Secondary | ICD-10-CM | POA: Diagnosis not present

## 2024-06-08 DIAGNOSIS — I483 Typical atrial flutter: Secondary | ICD-10-CM | POA: Diagnosis not present

## 2024-06-08 DIAGNOSIS — M6289 Other specified disorders of muscle: Secondary | ICD-10-CM | POA: Diagnosis not present

## 2024-06-08 DIAGNOSIS — R41841 Cognitive communication deficit: Secondary | ICD-10-CM | POA: Diagnosis not present

## 2024-06-08 DIAGNOSIS — M62561 Muscle wasting and atrophy, not elsewhere classified, right lower leg: Secondary | ICD-10-CM | POA: Diagnosis not present

## 2024-06-09 DIAGNOSIS — R2689 Other abnormalities of gait and mobility: Secondary | ICD-10-CM | POA: Diagnosis not present

## 2024-06-09 DIAGNOSIS — I483 Typical atrial flutter: Secondary | ICD-10-CM | POA: Diagnosis not present

## 2024-06-09 DIAGNOSIS — M6289 Other specified disorders of muscle: Secondary | ICD-10-CM | POA: Diagnosis not present

## 2024-06-09 DIAGNOSIS — R41841 Cognitive communication deficit: Secondary | ICD-10-CM | POA: Diagnosis not present

## 2024-06-09 DIAGNOSIS — F01A0 Vascular dementia, mild, without behavioral disturbance, psychotic disturbance, mood disturbance, and anxiety: Secondary | ICD-10-CM | POA: Diagnosis not present

## 2024-06-09 DIAGNOSIS — I10 Essential (primary) hypertension: Secondary | ICD-10-CM | POA: Diagnosis not present

## 2024-06-09 DIAGNOSIS — M62561 Muscle wasting and atrophy, not elsewhere classified, right lower leg: Secondary | ICD-10-CM | POA: Diagnosis not present

## 2024-06-09 LAB — BASIC METABOLIC PANEL WITH GFR
BUN: 29 — AB (ref 4–21)
CO2: 21 (ref 13–22)
Chloride: 101 (ref 99–108)
Creatinine: 1 (ref 0.6–1.3)
Glucose: 173
Potassium: 4.9 meq/L (ref 3.5–5.1)
Sodium: 139 (ref 137–147)

## 2024-06-09 LAB — COMPREHENSIVE METABOLIC PANEL WITH GFR
Calcium: 9.4 (ref 8.7–10.7)
eGFR: 73

## 2024-06-10 DIAGNOSIS — M6289 Other specified disorders of muscle: Secondary | ICD-10-CM | POA: Diagnosis not present

## 2024-06-10 DIAGNOSIS — F01A0 Vascular dementia, mild, without behavioral disturbance, psychotic disturbance, mood disturbance, and anxiety: Secondary | ICD-10-CM | POA: Diagnosis not present

## 2024-06-10 DIAGNOSIS — R41841 Cognitive communication deficit: Secondary | ICD-10-CM | POA: Diagnosis not present

## 2024-06-10 DIAGNOSIS — R3914 Feeling of incomplete bladder emptying: Secondary | ICD-10-CM | POA: Diagnosis not present

## 2024-06-10 DIAGNOSIS — M62561 Muscle wasting and atrophy, not elsewhere classified, right lower leg: Secondary | ICD-10-CM | POA: Diagnosis not present

## 2024-06-10 DIAGNOSIS — N401 Enlarged prostate with lower urinary tract symptoms: Secondary | ICD-10-CM | POA: Diagnosis not present

## 2024-06-10 DIAGNOSIS — R2689 Other abnormalities of gait and mobility: Secondary | ICD-10-CM | POA: Diagnosis not present

## 2024-06-10 DIAGNOSIS — R310 Gross hematuria: Secondary | ICD-10-CM | POA: Diagnosis not present

## 2024-06-10 DIAGNOSIS — I483 Typical atrial flutter: Secondary | ICD-10-CM | POA: Diagnosis not present

## 2024-06-11 DIAGNOSIS — I483 Typical atrial flutter: Secondary | ICD-10-CM | POA: Diagnosis not present

## 2024-06-11 DIAGNOSIS — R2689 Other abnormalities of gait and mobility: Secondary | ICD-10-CM | POA: Diagnosis not present

## 2024-06-11 DIAGNOSIS — M6289 Other specified disorders of muscle: Secondary | ICD-10-CM | POA: Diagnosis not present

## 2024-06-11 DIAGNOSIS — M62561 Muscle wasting and atrophy, not elsewhere classified, right lower leg: Secondary | ICD-10-CM | POA: Diagnosis not present

## 2024-06-11 DIAGNOSIS — F01A0 Vascular dementia, mild, without behavioral disturbance, psychotic disturbance, mood disturbance, and anxiety: Secondary | ICD-10-CM | POA: Diagnosis not present

## 2024-06-11 DIAGNOSIS — R41841 Cognitive communication deficit: Secondary | ICD-10-CM | POA: Diagnosis not present

## 2024-06-13 DIAGNOSIS — M62561 Muscle wasting and atrophy, not elsewhere classified, right lower leg: Secondary | ICD-10-CM | POA: Diagnosis not present

## 2024-06-13 DIAGNOSIS — M6289 Other specified disorders of muscle: Secondary | ICD-10-CM | POA: Diagnosis not present

## 2024-06-13 DIAGNOSIS — R2689 Other abnormalities of gait and mobility: Secondary | ICD-10-CM | POA: Diagnosis not present

## 2024-06-13 DIAGNOSIS — F01A0 Vascular dementia, mild, without behavioral disturbance, psychotic disturbance, mood disturbance, and anxiety: Secondary | ICD-10-CM | POA: Diagnosis not present

## 2024-06-13 DIAGNOSIS — R41841 Cognitive communication deficit: Secondary | ICD-10-CM | POA: Diagnosis not present

## 2024-06-13 DIAGNOSIS — I483 Typical atrial flutter: Secondary | ICD-10-CM | POA: Diagnosis not present

## 2024-06-13 NOTE — Telephone Encounter (Signed)
 Done

## 2024-06-14 DIAGNOSIS — R41841 Cognitive communication deficit: Secondary | ICD-10-CM | POA: Diagnosis not present

## 2024-06-14 DIAGNOSIS — F01A0 Vascular dementia, mild, without behavioral disturbance, psychotic disturbance, mood disturbance, and anxiety: Secondary | ICD-10-CM | POA: Diagnosis not present

## 2024-06-14 DIAGNOSIS — M6289 Other specified disorders of muscle: Secondary | ICD-10-CM | POA: Diagnosis not present

## 2024-06-14 DIAGNOSIS — M62561 Muscle wasting and atrophy, not elsewhere classified, right lower leg: Secondary | ICD-10-CM | POA: Diagnosis not present

## 2024-06-14 DIAGNOSIS — I483 Typical atrial flutter: Secondary | ICD-10-CM | POA: Diagnosis not present

## 2024-06-14 DIAGNOSIS — R2689 Other abnormalities of gait and mobility: Secondary | ICD-10-CM | POA: Diagnosis not present

## 2024-06-15 DIAGNOSIS — M6289 Other specified disorders of muscle: Secondary | ICD-10-CM | POA: Diagnosis not present

## 2024-06-15 DIAGNOSIS — R41841 Cognitive communication deficit: Secondary | ICD-10-CM | POA: Diagnosis not present

## 2024-06-15 DIAGNOSIS — R2689 Other abnormalities of gait and mobility: Secondary | ICD-10-CM | POA: Diagnosis not present

## 2024-06-15 DIAGNOSIS — I483 Typical atrial flutter: Secondary | ICD-10-CM | POA: Diagnosis not present

## 2024-06-15 DIAGNOSIS — F01A0 Vascular dementia, mild, without behavioral disturbance, psychotic disturbance, mood disturbance, and anxiety: Secondary | ICD-10-CM | POA: Diagnosis not present

## 2024-06-15 DIAGNOSIS — M62561 Muscle wasting and atrophy, not elsewhere classified, right lower leg: Secondary | ICD-10-CM | POA: Diagnosis not present

## 2024-06-16 DIAGNOSIS — I483 Typical atrial flutter: Secondary | ICD-10-CM | POA: Diagnosis not present

## 2024-06-16 DIAGNOSIS — R41841 Cognitive communication deficit: Secondary | ICD-10-CM | POA: Diagnosis not present

## 2024-06-16 DIAGNOSIS — F01A0 Vascular dementia, mild, without behavioral disturbance, psychotic disturbance, mood disturbance, and anxiety: Secondary | ICD-10-CM | POA: Diagnosis not present

## 2024-06-16 DIAGNOSIS — M62561 Muscle wasting and atrophy, not elsewhere classified, right lower leg: Secondary | ICD-10-CM | POA: Diagnosis not present

## 2024-06-16 DIAGNOSIS — R2689 Other abnormalities of gait and mobility: Secondary | ICD-10-CM | POA: Diagnosis not present

## 2024-06-16 DIAGNOSIS — M6289 Other specified disorders of muscle: Secondary | ICD-10-CM | POA: Diagnosis not present

## 2024-06-17 DIAGNOSIS — M6289 Other specified disorders of muscle: Secondary | ICD-10-CM | POA: Diagnosis not present

## 2024-06-17 DIAGNOSIS — F01A0 Vascular dementia, mild, without behavioral disturbance, psychotic disturbance, mood disturbance, and anxiety: Secondary | ICD-10-CM | POA: Diagnosis not present

## 2024-06-17 DIAGNOSIS — R2689 Other abnormalities of gait and mobility: Secondary | ICD-10-CM | POA: Diagnosis not present

## 2024-06-17 DIAGNOSIS — R41841 Cognitive communication deficit: Secondary | ICD-10-CM | POA: Diagnosis not present

## 2024-06-17 DIAGNOSIS — I483 Typical atrial flutter: Secondary | ICD-10-CM | POA: Diagnosis not present

## 2024-06-17 DIAGNOSIS — M62561 Muscle wasting and atrophy, not elsewhere classified, right lower leg: Secondary | ICD-10-CM | POA: Diagnosis not present

## 2024-06-19 DIAGNOSIS — M62561 Muscle wasting and atrophy, not elsewhere classified, right lower leg: Secondary | ICD-10-CM | POA: Diagnosis not present

## 2024-06-19 DIAGNOSIS — F01A0 Vascular dementia, mild, without behavioral disturbance, psychotic disturbance, mood disturbance, and anxiety: Secondary | ICD-10-CM | POA: Diagnosis not present

## 2024-06-19 DIAGNOSIS — I483 Typical atrial flutter: Secondary | ICD-10-CM | POA: Diagnosis not present

## 2024-06-19 DIAGNOSIS — R41841 Cognitive communication deficit: Secondary | ICD-10-CM | POA: Diagnosis not present

## 2024-06-19 DIAGNOSIS — R2689 Other abnormalities of gait and mobility: Secondary | ICD-10-CM | POA: Diagnosis not present

## 2024-06-19 DIAGNOSIS — M6289 Other specified disorders of muscle: Secondary | ICD-10-CM | POA: Diagnosis not present

## 2024-06-20 DIAGNOSIS — R41841 Cognitive communication deficit: Secondary | ICD-10-CM | POA: Diagnosis not present

## 2024-06-20 DIAGNOSIS — R2689 Other abnormalities of gait and mobility: Secondary | ICD-10-CM | POA: Diagnosis not present

## 2024-06-20 DIAGNOSIS — F01A0 Vascular dementia, mild, without behavioral disturbance, psychotic disturbance, mood disturbance, and anxiety: Secondary | ICD-10-CM | POA: Diagnosis not present

## 2024-06-20 DIAGNOSIS — M62561 Muscle wasting and atrophy, not elsewhere classified, right lower leg: Secondary | ICD-10-CM | POA: Diagnosis not present

## 2024-06-20 DIAGNOSIS — M6289 Other specified disorders of muscle: Secondary | ICD-10-CM | POA: Diagnosis not present

## 2024-06-20 DIAGNOSIS — I483 Typical atrial flutter: Secondary | ICD-10-CM | POA: Diagnosis not present

## 2024-06-21 DIAGNOSIS — R2689 Other abnormalities of gait and mobility: Secondary | ICD-10-CM | POA: Diagnosis not present

## 2024-06-21 DIAGNOSIS — M6289 Other specified disorders of muscle: Secondary | ICD-10-CM | POA: Diagnosis not present

## 2024-06-21 DIAGNOSIS — M62561 Muscle wasting and atrophy, not elsewhere classified, right lower leg: Secondary | ICD-10-CM | POA: Diagnosis not present

## 2024-06-21 DIAGNOSIS — I483 Typical atrial flutter: Secondary | ICD-10-CM | POA: Diagnosis not present

## 2024-06-21 DIAGNOSIS — R41841 Cognitive communication deficit: Secondary | ICD-10-CM | POA: Diagnosis not present

## 2024-06-21 DIAGNOSIS — F01A0 Vascular dementia, mild, without behavioral disturbance, psychotic disturbance, mood disturbance, and anxiety: Secondary | ICD-10-CM | POA: Diagnosis not present

## 2024-06-22 ENCOUNTER — Telehealth: Payer: Self-pay | Admitting: Orthopedic Surgery

## 2024-06-22 DIAGNOSIS — M62561 Muscle wasting and atrophy, not elsewhere classified, right lower leg: Secondary | ICD-10-CM | POA: Diagnosis not present

## 2024-06-22 DIAGNOSIS — R41841 Cognitive communication deficit: Secondary | ICD-10-CM | POA: Diagnosis not present

## 2024-06-22 DIAGNOSIS — I483 Typical atrial flutter: Secondary | ICD-10-CM | POA: Diagnosis not present

## 2024-06-22 DIAGNOSIS — F01A0 Vascular dementia, mild, without behavioral disturbance, psychotic disturbance, mood disturbance, and anxiety: Secondary | ICD-10-CM | POA: Diagnosis not present

## 2024-06-22 DIAGNOSIS — M6289 Other specified disorders of muscle: Secondary | ICD-10-CM | POA: Diagnosis not present

## 2024-06-22 DIAGNOSIS — R2689 Other abnormalities of gait and mobility: Secondary | ICD-10-CM | POA: Diagnosis not present

## 2024-06-22 NOTE — Telephone Encounter (Signed)
 06/20/2024 MMSE 24/30, correct sentence and shapes.

## 2024-06-23 DIAGNOSIS — M6289 Other specified disorders of muscle: Secondary | ICD-10-CM | POA: Diagnosis not present

## 2024-06-23 DIAGNOSIS — R41841 Cognitive communication deficit: Secondary | ICD-10-CM | POA: Diagnosis not present

## 2024-06-23 DIAGNOSIS — I483 Typical atrial flutter: Secondary | ICD-10-CM | POA: Diagnosis not present

## 2024-06-23 DIAGNOSIS — R2689 Other abnormalities of gait and mobility: Secondary | ICD-10-CM | POA: Diagnosis not present

## 2024-06-23 DIAGNOSIS — F01A0 Vascular dementia, mild, without behavioral disturbance, psychotic disturbance, mood disturbance, and anxiety: Secondary | ICD-10-CM | POA: Diagnosis not present

## 2024-06-23 DIAGNOSIS — M62561 Muscle wasting and atrophy, not elsewhere classified, right lower leg: Secondary | ICD-10-CM | POA: Diagnosis not present

## 2024-06-24 DIAGNOSIS — Z9181 History of falling: Secondary | ICD-10-CM | POA: Diagnosis not present

## 2024-06-24 DIAGNOSIS — I2481 Acute coronary microvascular dysfunction: Secondary | ICD-10-CM | POA: Diagnosis not present

## 2024-06-24 DIAGNOSIS — F01A0 Vascular dementia, mild, without behavioral disturbance, psychotic disturbance, mood disturbance, and anxiety: Secondary | ICD-10-CM | POA: Diagnosis not present

## 2024-06-24 DIAGNOSIS — M62562 Muscle wasting and atrophy, not elsewhere classified, left lower leg: Secondary | ICD-10-CM | POA: Diagnosis not present

## 2024-06-24 DIAGNOSIS — Z950 Presence of cardiac pacemaker: Secondary | ICD-10-CM | POA: Diagnosis not present

## 2024-06-24 DIAGNOSIS — M6289 Other specified disorders of muscle: Secondary | ICD-10-CM | POA: Diagnosis not present

## 2024-06-24 DIAGNOSIS — M62561 Muscle wasting and atrophy, not elsewhere classified, right lower leg: Secondary | ICD-10-CM | POA: Diagnosis not present

## 2024-06-24 DIAGNOSIS — I483 Typical atrial flutter: Secondary | ICD-10-CM | POA: Diagnosis not present

## 2024-06-24 DIAGNOSIS — R2689 Other abnormalities of gait and mobility: Secondary | ICD-10-CM | POA: Diagnosis not present

## 2024-06-24 DIAGNOSIS — R41841 Cognitive communication deficit: Secondary | ICD-10-CM | POA: Diagnosis not present

## 2024-06-27 DIAGNOSIS — M62561 Muscle wasting and atrophy, not elsewhere classified, right lower leg: Secondary | ICD-10-CM | POA: Diagnosis not present

## 2024-06-27 DIAGNOSIS — I483 Typical atrial flutter: Secondary | ICD-10-CM | POA: Diagnosis not present

## 2024-06-27 DIAGNOSIS — R41841 Cognitive communication deficit: Secondary | ICD-10-CM | POA: Diagnosis not present

## 2024-06-27 DIAGNOSIS — R2689 Other abnormalities of gait and mobility: Secondary | ICD-10-CM | POA: Diagnosis not present

## 2024-06-27 DIAGNOSIS — M6289 Other specified disorders of muscle: Secondary | ICD-10-CM | POA: Diagnosis not present

## 2024-06-27 DIAGNOSIS — F01A0 Vascular dementia, mild, without behavioral disturbance, psychotic disturbance, mood disturbance, and anxiety: Secondary | ICD-10-CM | POA: Diagnosis not present

## 2024-06-28 DIAGNOSIS — R41841 Cognitive communication deficit: Secondary | ICD-10-CM | POA: Diagnosis not present

## 2024-06-28 DIAGNOSIS — I483 Typical atrial flutter: Secondary | ICD-10-CM | POA: Diagnosis not present

## 2024-06-28 DIAGNOSIS — M62561 Muscle wasting and atrophy, not elsewhere classified, right lower leg: Secondary | ICD-10-CM | POA: Diagnosis not present

## 2024-06-28 DIAGNOSIS — F01A0 Vascular dementia, mild, without behavioral disturbance, psychotic disturbance, mood disturbance, and anxiety: Secondary | ICD-10-CM | POA: Diagnosis not present

## 2024-06-28 DIAGNOSIS — M6289 Other specified disorders of muscle: Secondary | ICD-10-CM | POA: Diagnosis not present

## 2024-06-28 DIAGNOSIS — R2689 Other abnormalities of gait and mobility: Secondary | ICD-10-CM | POA: Diagnosis not present

## 2024-06-29 DIAGNOSIS — M6289 Other specified disorders of muscle: Secondary | ICD-10-CM | POA: Diagnosis not present

## 2024-06-29 DIAGNOSIS — R2689 Other abnormalities of gait and mobility: Secondary | ICD-10-CM | POA: Diagnosis not present

## 2024-06-29 DIAGNOSIS — M62561 Muscle wasting and atrophy, not elsewhere classified, right lower leg: Secondary | ICD-10-CM | POA: Diagnosis not present

## 2024-06-29 DIAGNOSIS — I483 Typical atrial flutter: Secondary | ICD-10-CM | POA: Diagnosis not present

## 2024-06-29 DIAGNOSIS — R41841 Cognitive communication deficit: Secondary | ICD-10-CM | POA: Diagnosis not present

## 2024-06-29 DIAGNOSIS — F01A0 Vascular dementia, mild, without behavioral disturbance, psychotic disturbance, mood disturbance, and anxiety: Secondary | ICD-10-CM | POA: Diagnosis not present

## 2024-06-30 DIAGNOSIS — R41841 Cognitive communication deficit: Secondary | ICD-10-CM | POA: Diagnosis not present

## 2024-06-30 DIAGNOSIS — R2689 Other abnormalities of gait and mobility: Secondary | ICD-10-CM | POA: Diagnosis not present

## 2024-06-30 DIAGNOSIS — M6289 Other specified disorders of muscle: Secondary | ICD-10-CM | POA: Diagnosis not present

## 2024-06-30 DIAGNOSIS — F01A0 Vascular dementia, mild, without behavioral disturbance, psychotic disturbance, mood disturbance, and anxiety: Secondary | ICD-10-CM | POA: Diagnosis not present

## 2024-06-30 DIAGNOSIS — M62561 Muscle wasting and atrophy, not elsewhere classified, right lower leg: Secondary | ICD-10-CM | POA: Diagnosis not present

## 2024-06-30 DIAGNOSIS — I483 Typical atrial flutter: Secondary | ICD-10-CM | POA: Diagnosis not present

## 2024-07-04 DIAGNOSIS — R2689 Other abnormalities of gait and mobility: Secondary | ICD-10-CM | POA: Diagnosis not present

## 2024-07-04 DIAGNOSIS — M62561 Muscle wasting and atrophy, not elsewhere classified, right lower leg: Secondary | ICD-10-CM | POA: Diagnosis not present

## 2024-07-04 DIAGNOSIS — M6289 Other specified disorders of muscle: Secondary | ICD-10-CM | POA: Diagnosis not present

## 2024-07-04 DIAGNOSIS — I483 Typical atrial flutter: Secondary | ICD-10-CM | POA: Diagnosis not present

## 2024-07-04 DIAGNOSIS — F01A0 Vascular dementia, mild, without behavioral disturbance, psychotic disturbance, mood disturbance, and anxiety: Secondary | ICD-10-CM | POA: Diagnosis not present

## 2024-07-04 DIAGNOSIS — R41841 Cognitive communication deficit: Secondary | ICD-10-CM | POA: Diagnosis not present

## 2024-07-06 DIAGNOSIS — M62561 Muscle wasting and atrophy, not elsewhere classified, right lower leg: Secondary | ICD-10-CM | POA: Diagnosis not present

## 2024-07-06 DIAGNOSIS — R2689 Other abnormalities of gait and mobility: Secondary | ICD-10-CM | POA: Diagnosis not present

## 2024-07-06 DIAGNOSIS — M6289 Other specified disorders of muscle: Secondary | ICD-10-CM | POA: Diagnosis not present

## 2024-07-06 DIAGNOSIS — R41841 Cognitive communication deficit: Secondary | ICD-10-CM | POA: Diagnosis not present

## 2024-07-06 DIAGNOSIS — F01A0 Vascular dementia, mild, without behavioral disturbance, psychotic disturbance, mood disturbance, and anxiety: Secondary | ICD-10-CM | POA: Diagnosis not present

## 2024-07-06 DIAGNOSIS — I483 Typical atrial flutter: Secondary | ICD-10-CM | POA: Diagnosis not present

## 2024-07-07 DIAGNOSIS — R2689 Other abnormalities of gait and mobility: Secondary | ICD-10-CM | POA: Diagnosis not present

## 2024-07-07 DIAGNOSIS — R41841 Cognitive communication deficit: Secondary | ICD-10-CM | POA: Diagnosis not present

## 2024-07-07 DIAGNOSIS — M6289 Other specified disorders of muscle: Secondary | ICD-10-CM | POA: Diagnosis not present

## 2024-07-07 DIAGNOSIS — F01A0 Vascular dementia, mild, without behavioral disturbance, psychotic disturbance, mood disturbance, and anxiety: Secondary | ICD-10-CM | POA: Diagnosis not present

## 2024-07-07 DIAGNOSIS — I483 Typical atrial flutter: Secondary | ICD-10-CM | POA: Diagnosis not present

## 2024-07-07 DIAGNOSIS — M62561 Muscle wasting and atrophy, not elsewhere classified, right lower leg: Secondary | ICD-10-CM | POA: Diagnosis not present

## 2024-07-08 DIAGNOSIS — I483 Typical atrial flutter: Secondary | ICD-10-CM | POA: Diagnosis not present

## 2024-07-08 DIAGNOSIS — R2689 Other abnormalities of gait and mobility: Secondary | ICD-10-CM | POA: Diagnosis not present

## 2024-07-08 DIAGNOSIS — F01A0 Vascular dementia, mild, without behavioral disturbance, psychotic disturbance, mood disturbance, and anxiety: Secondary | ICD-10-CM | POA: Diagnosis not present

## 2024-07-08 DIAGNOSIS — M6289 Other specified disorders of muscle: Secondary | ICD-10-CM | POA: Diagnosis not present

## 2024-07-08 DIAGNOSIS — M62561 Muscle wasting and atrophy, not elsewhere classified, right lower leg: Secondary | ICD-10-CM | POA: Diagnosis not present

## 2024-07-08 DIAGNOSIS — R41841 Cognitive communication deficit: Secondary | ICD-10-CM | POA: Diagnosis not present

## 2024-07-09 DIAGNOSIS — F01A0 Vascular dementia, mild, without behavioral disturbance, psychotic disturbance, mood disturbance, and anxiety: Secondary | ICD-10-CM | POA: Diagnosis not present

## 2024-07-09 DIAGNOSIS — I483 Typical atrial flutter: Secondary | ICD-10-CM | POA: Diagnosis not present

## 2024-07-09 DIAGNOSIS — M6289 Other specified disorders of muscle: Secondary | ICD-10-CM | POA: Diagnosis not present

## 2024-07-09 DIAGNOSIS — R2689 Other abnormalities of gait and mobility: Secondary | ICD-10-CM | POA: Diagnosis not present

## 2024-07-09 DIAGNOSIS — M62561 Muscle wasting and atrophy, not elsewhere classified, right lower leg: Secondary | ICD-10-CM | POA: Diagnosis not present

## 2024-07-09 DIAGNOSIS — R41841 Cognitive communication deficit: Secondary | ICD-10-CM | POA: Diagnosis not present

## 2024-07-11 DIAGNOSIS — F01A0 Vascular dementia, mild, without behavioral disturbance, psychotic disturbance, mood disturbance, and anxiety: Secondary | ICD-10-CM | POA: Diagnosis not present

## 2024-07-11 DIAGNOSIS — R41841 Cognitive communication deficit: Secondary | ICD-10-CM | POA: Diagnosis not present

## 2024-07-11 DIAGNOSIS — M6289 Other specified disorders of muscle: Secondary | ICD-10-CM | POA: Diagnosis not present

## 2024-07-11 DIAGNOSIS — M62561 Muscle wasting and atrophy, not elsewhere classified, right lower leg: Secondary | ICD-10-CM | POA: Diagnosis not present

## 2024-07-11 DIAGNOSIS — R2689 Other abnormalities of gait and mobility: Secondary | ICD-10-CM | POA: Diagnosis not present

## 2024-07-11 DIAGNOSIS — I483 Typical atrial flutter: Secondary | ICD-10-CM | POA: Diagnosis not present

## 2024-07-11 NOTE — Progress Notes (Unsigned)
  Electrophysiology Office Note:   ID:  Daniel Moses, DOB 1934/02/28, MRN [id number]  Primary Cardiologist: Lurena MARLA Red, MD Electrophysiologist: OLE ONEIDA HOLTS, MD  {Click to update primary MD,subspecialty MD or APP then REFRESH:1}    History of Present Illness:   Daniel Moses is a 89 y.o. male with h/o symptomatic brady s/p PPM, HTN, and AFL seen today for routine electrophysiology followup.   Since last being seen in our clinic the patient reports doing ***.  he denies chest pain, palpitations, dyspnea, PND, orthopnea, nausea, vomiting, dizziness, syncope, edema, weight gain, or early satiety.   Review of systems complete and found to be negative unless listed in HPI.   EP Information / Studies Reviewed:    EKG is ordered today. Personal review as below.       PPM Interrogation-  reviewed in detail today,  See PACEART report.  Arrhythmia/Device History Medtronic Dual Chamber PPM 04/07/2024 for  symptomatic bradycardia (Left bundle lead)   Physical Exam:   VS:  There were no vitals taken for this visit.   Wt Readings from Last 3 Encounters:  06/02/24 198 lb 6.4 oz (90 kg)  04/26/24 192 lb 12.8 oz (87.5 kg)  04/11/24 194 lb (88 kg)     GEN: No acute distress  NECK: No JVD; No carotid bruits CARDIAC: {EPRHYTHM:28826}, no murmurs, rubs, gallops RESPIRATORY:  Clear to auscultation without rales, wheezing or rhonchi  ABDOMEN: Soft, non-tender, non-distended EXTREMITIES:  {EDEMA LEVEL:28147::No} edema; No deformity   ASSESSMENT AND PLAN:    Symptomatic bradycardia s/p Medtronic PPM  Normal PPM function See Pace Art report No changes today  H/o AFL Follow burden on device  HTN Stable on current regimen   {Click here to Review PMH, Prob List, Meds, Allergies, SHx, FHx  :1}   Disposition:   Follow up with {EPPROVIDERS:28135::EP Team} {EPFOLLOW UP:28173}  Signed, Ozell Prentice Passey, PA-C

## 2024-07-12 ENCOUNTER — Ambulatory Visit: Attending: Student | Admitting: Student

## 2024-07-12 ENCOUNTER — Encounter: Payer: Self-pay | Admitting: Student

## 2024-07-12 VITALS — BP 116/68 | HR 60 | Ht 70.0 in

## 2024-07-12 DIAGNOSIS — F01A0 Vascular dementia, mild, without behavioral disturbance, psychotic disturbance, mood disturbance, and anxiety: Secondary | ICD-10-CM | POA: Diagnosis not present

## 2024-07-12 DIAGNOSIS — R2689 Other abnormalities of gait and mobility: Secondary | ICD-10-CM | POA: Diagnosis not present

## 2024-07-12 DIAGNOSIS — M62561 Muscle wasting and atrophy, not elsewhere classified, right lower leg: Secondary | ICD-10-CM | POA: Diagnosis not present

## 2024-07-12 DIAGNOSIS — I4892 Unspecified atrial flutter: Secondary | ICD-10-CM | POA: Diagnosis not present

## 2024-07-12 DIAGNOSIS — R001 Bradycardia, unspecified: Secondary | ICD-10-CM

## 2024-07-12 DIAGNOSIS — I10 Essential (primary) hypertension: Secondary | ICD-10-CM | POA: Insufficient documentation

## 2024-07-12 DIAGNOSIS — R41841 Cognitive communication deficit: Secondary | ICD-10-CM | POA: Diagnosis not present

## 2024-07-12 DIAGNOSIS — I4891 Unspecified atrial fibrillation: Secondary | ICD-10-CM

## 2024-07-12 DIAGNOSIS — I483 Typical atrial flutter: Secondary | ICD-10-CM | POA: Diagnosis not present

## 2024-07-12 DIAGNOSIS — M6289 Other specified disorders of muscle: Secondary | ICD-10-CM | POA: Diagnosis not present

## 2024-07-12 LAB — CUP PACEART INCLINIC DEVICE CHECK
Battery Remaining Longevity: 143 mo
Battery Voltage: 3.18 V
Brady Statistic AP VP Percent: 61.05 %
Brady Statistic AP VS Percent: 0.15 %
Brady Statistic AS VP Percent: 35.54 %
Brady Statistic AS VS Percent: 3.26 %
Brady Statistic RA Percent Paced: 61.68 %
Brady Statistic RV Percent Paced: 96.59 %
Date Time Interrogation Session: 20250819125535
Implantable Lead Connection Status: 753985
Implantable Lead Connection Status: 753985
Implantable Lead Implant Date: 20250515
Implantable Lead Implant Date: 20250515
Implantable Lead Location: 753859
Implantable Lead Location: 753860
Implantable Lead Model: 3830
Implantable Lead Model: 5076
Implantable Pulse Generator Implant Date: 20250515
Lead Channel Impedance Value: 361 Ohm
Lead Channel Impedance Value: 380 Ohm
Lead Channel Impedance Value: 494 Ohm
Lead Channel Impedance Value: 513 Ohm
Lead Channel Pacing Threshold Amplitude: 0.625 V
Lead Channel Pacing Threshold Amplitude: 0.75 V
Lead Channel Pacing Threshold Pulse Width: 0.4 ms
Lead Channel Pacing Threshold Pulse Width: 0.4 ms
Lead Channel Sensing Intrinsic Amplitude: 2.25 mV
Lead Channel Sensing Intrinsic Amplitude: 2.625 mV
Lead Channel Sensing Intrinsic Amplitude: 27.125 mV
Lead Channel Sensing Intrinsic Amplitude: 27.125 mV
Lead Channel Setting Pacing Amplitude: 2 V
Lead Channel Setting Pacing Amplitude: 2 V
Lead Channel Setting Pacing Pulse Width: 0.4 ms
Lead Channel Setting Sensing Sensitivity: 0.9 mV
Zone Setting Status: 755011
Zone Setting Status: 755011

## 2024-07-12 NOTE — Patient Instructions (Signed)
 Medication Instructions:  Your physician recommends that you continue on your current medications as directed. Please refer to the Current Medication list given to you today.  *If you need a refill on your cardiac medications before your next appointment, please call your pharmacy*  Lab Work: None ordered If you have labs (blood work) drawn today and your tests are completely normal, you will receive your results only by: MyChart Message (if you have MyChart) OR A paper copy in the mail If you have any lab test that is abnormal or we need to change your treatment, we will call you to review the results.  Follow-Up: At [facility], you and your health needs are our priority.  As part of our continuing mission to provide you with exceptional heart care, our providers are all part of one team.  This team includes your primary Cardiologist (physician) and Advanced Practice Providers or APPs (Physician Assistants and Nurse Practitioners) who all work together to provide you with the care you need, when you need it.  Your next appointment:   1 year(s)  Provider:   You may see Boyce Byes, MD or one of the following Advanced Practice Providers on your designated Care Team:   Mertha Abrahams, PA-C Michael Andy Tillery, PA-C Suzann Riddle, NP Creighton Doffing, NP  We recommend signing up for the patient portal called MyChart.  Sign up information is provided on this After Visit Summary.  MyChart is used to connect with patients for Virtual Visits (Telemedicine).  Patients are able to view lab/test results, encounter notes, upcoming appointments, etc.  Non-urgent messages can be sent to your provider as well.   To learn more about what you can do with MyChart, go to [email or web address].

## 2024-07-14 ENCOUNTER — Ambulatory Visit: Payer: Self-pay | Admitting: Cardiology

## 2024-07-15 ENCOUNTER — Encounter: Payer: Self-pay | Admitting: Adult Health

## 2024-07-15 ENCOUNTER — Non-Acute Institutional Stay (SKILLED_NURSING_FACILITY): Payer: Self-pay | Admitting: Adult Health

## 2024-07-15 DIAGNOSIS — M6289 Other specified disorders of muscle: Secondary | ICD-10-CM | POA: Diagnosis not present

## 2024-07-15 DIAGNOSIS — N401 Enlarged prostate with lower urinary tract symptoms: Secondary | ICD-10-CM

## 2024-07-15 DIAGNOSIS — M62561 Muscle wasting and atrophy, not elsewhere classified, right lower leg: Secondary | ICD-10-CM | POA: Diagnosis not present

## 2024-07-15 DIAGNOSIS — I10 Essential (primary) hypertension: Secondary | ICD-10-CM | POA: Diagnosis not present

## 2024-07-15 DIAGNOSIS — F0150 Vascular dementia without behavioral disturbance: Secondary | ICD-10-CM

## 2024-07-15 DIAGNOSIS — R3914 Feeling of incomplete bladder emptying: Secondary | ICD-10-CM

## 2024-07-15 DIAGNOSIS — R2689 Other abnormalities of gait and mobility: Secondary | ICD-10-CM | POA: Diagnosis not present

## 2024-07-15 DIAGNOSIS — K769 Liver disease, unspecified: Secondary | ICD-10-CM | POA: Diagnosis not present

## 2024-07-15 DIAGNOSIS — I4891 Unspecified atrial fibrillation: Secondary | ICD-10-CM

## 2024-07-15 DIAGNOSIS — I4892 Unspecified atrial flutter: Secondary | ICD-10-CM | POA: Diagnosis not present

## 2024-07-15 DIAGNOSIS — I483 Typical atrial flutter: Secondary | ICD-10-CM | POA: Diagnosis not present

## 2024-07-15 DIAGNOSIS — G3184 Mild cognitive impairment, so stated: Secondary | ICD-10-CM

## 2024-07-15 DIAGNOSIS — F01A0 Vascular dementia, mild, without behavioral disturbance, psychotic disturbance, mood disturbance, and anxiety: Secondary | ICD-10-CM | POA: Diagnosis not present

## 2024-07-15 DIAGNOSIS — M170 Bilateral primary osteoarthritis of knee: Secondary | ICD-10-CM

## 2024-07-15 DIAGNOSIS — R41841 Cognitive communication deficit: Secondary | ICD-10-CM | POA: Diagnosis not present

## 2024-07-15 NOTE — Progress Notes (Unsigned)
 Location:  [facility] Room Number: 134-P Place of Service:  SNF [phone]) Provider: Darlean Maus, NP  Patient Care Team: Charlanne Fredia CROME, MD as PCP - General (Internal Medicine) Thukkani, Arun K, MD as PCP - Cardiology (Cardiology) Cindie Ole DASEN, MD as PCP - Electrophysiology (Cardiology)  Extended Emergency Contact Information Primary Emergency Contact: Kafer,BETTY A Address: [address]          [place], [place] [address] [place] Home Phone: [phone] Relation: Spouse Secondary Emergency Contact: Leveque,Cindy Mobile Phone: [phone] Relation: Daughter  Code Status:  DNR Goals of care: Advanced Directive information    04/26/2024    3:48 PM  Advanced Directives  Does Patient Have a Medical Advance Directive? Yes  Type of Advance Directive Living will;Out of facility DNR (pink MOST or yellow form)  Does patient want to make changes to medical advance directive? No - Patient declined     Chief Complaint  Patient presents with  . Medical Management of Chronic Issues    HPI:  Pt is a 89 y.o. male seen today for medical management of chronic diseases.  Resides in skilled care, retired Sports administrator. Wife lives in the  memory care unit.     He was hospitalized from Apr 05, 2024, to Apr 08, 2024, following a fall without loss of consciousness. During the emergency department visit, he was found to have an irregular rhythm and bradycardia, leading to the placement of a pacemaker on Apr 07, 2024. He is not experiencing palpitations or shortness of breath since the pacemaker placement.  CT of the chest 04/05/24 showed large nodular adenopathy, also questionable low attenuation area in the right lobe of the liver  Gained 8 lbs in the past 3 months.  BP soft today but other numbers are elevated at times 150-60/70-80  Saw cardiology 07/12/24 EKG showed AV dual paced rhythm. NSR, no changes to care.   He has a history of  benign prostatic hyperplasia (BPH) and is scheduled for a cystoscopy on June 10, 2024 due to hematuria and UTI.  He was treated for a cough on April 26, 2024, with a chest x-ray showing possible pneumonia, and was placed on doxycycline  for seven days. Symptoms have mostly resolved, though he still has a slight cough and congestion with sputum production.  He experiences chronic right hip and knee  pain and is on scheduled Tylenol  twice a day for arthritic pain. He reports a recent episode of left knee pain causing cramps in his lower leg, but currently feels pain-free after repositioning his leg.  Vascular dementia with an MMSE score of 24/30 06/20/24. Has seen neurology. MRI showed moderate chronic micro vascular changes  HLD on zetia  and zocor  Lab Results  Component Value Date   [facility] 46 11/19/2023    Past Medical History:  Diagnosis Date  . Arthritis    back and knees  . Benign prostatic hyperplasia with incomplete bladder emptying 06/15/2023  . Hyperlipidemia 06/15/2023  . Mild cognitive impairment 06/15/2023  . Primary hypertension 06/15/2023  . Primary osteoarthritis of both knees 06/15/2023  . Varicose veins   . Varicose veins of bilateral lower extremities with other complications 08/03/2013   IMO SNOMED Dx Update Oct 2024     Past Surgical History:  Procedure Laterality Date  . INNER EAR SURGERY    . PACEMAKER IMPLANT N/A 04/07/2024   Procedure: PACEMAKER IMPLANT;  Surgeon: Cindie Ole DASEN, MD;  Location: [facility] INVASIVE CV LAB;  Service: Cardiovascular;  Laterality:  N/A;  . SQUAMOUS CELL CARCINOMA EXCISION     on chest    Allergies  Allergen Reactions  . Atorvastatin Other (See Comments)    Myalgia    Outpatient Encounter Medications as of 07/15/2024  Medication Sig  . acetaminophen  (TYLENOL ) 500 MG tablet Take 1,000 mg by mouth 2 (two) times daily. May take one additional 1000mg  dose as needed for pain  . diclofenac  Sodium (VOLTAREN ) 1 % GEL Apply 4 g topically in  the morning, at noon, and at bedtime.  . ezetimibe  (ZETIA ) 10 MG tablet Take 10 mg by mouth daily.  . finasteride  (PROSCAR ) 5 MG tablet Take 5 mg by mouth in the morning.  . galantamine  (RAZADYNE  ER) 8 MG 24 hr capsule Take 8 mg by mouth daily with breakfast.  . losartan  (COZAAR ) 25 MG tablet Take 50 mg by mouth every morning.  . Multiple Vitamins-Minerals (CENTRUM SILVER) CHEW Chew 1 tablet by mouth in the morning.  . simvastatin  (ZOCOR ) 40 MG tablet Take 40 mg by mouth every evening.  . tamsulosin  (FLOMAX ) 0.4 MG CAPS capsule Take 0.4 mg by mouth daily.  . acetaminophen  (TYLENOL ) 500 MG tablet Take 500 mg by mouth daily as needed (Every 24 hours prn for breakthrough pain).  . Dextromethorphan-guaiFENesin (ROBAFEN DM CGH/CHEST CONGEST PO) Take 10 mLs by mouth every 6 (six) hours as needed. 10 mL, oral, Every 6 Hours - PRN, Robafen DM (avoid if pt has high blood pressure) 10mL p.o. q 6 hours PRN cough and congestion x 2 days. If persists > 2 days or fever over 100,notify provider.  . fluticasone  (CUTIVATE ) 0.005 % ointment Apply 1 Application topically 2 (two) times daily as needed (Apply to nose for skin issues).  . fluticasone  (FLONASE ) 50 MCG/ACT nasal spray Place 1 spray into both nostrils 2 (two) times daily as needed for allergies or rhinitis.  SABRA ipratropium-albuterol (DUONEB) 0.5-2.5 (3) MG/3ML SOLN Take 3 mLs by nebulization 2 (two) times daily as needed. 1, inhalation, Twice A Day, Duonebs BID x 2 days, then BID PRN x 10 days for wheezing  . menthol-cetylpyridinium (CEPACOL) 3 MG lozenge Take 1 lozenge by mouth as needed for sore throat.  . polyethylene glycol (MIRALAX / GLYCOLAX) 17 g packet Take 17 g by mouth as needed.  . triamcinolone  ointment (KENALOG ) 0.1 % Apply 1 Application topically 2 (two) times daily as needed.  . zinc oxide (ENDIT) 20 % ointment Apply 1 Application topically every 8 (eight) hours as needed for irritation.   No facility-administered encounter medications on  file as of 07/15/2024.    Review of Systems  Immunization History  Administered Date(s) Administered  . Fluad Trivalent(High Dose 65+) 09/15/2023  . Influenza Split 03/28/2010, 09/24/2010, 12/17/2011, 02/28/2013, 08/15/2013, 08/23/2014, 08/05/2020  . Influenza, High Dose Seasonal PF 08/15/2015, 07/29/2016, 08/25/2017  . Influenza-Unspecified 08/04/2018, 07/14/2019  . Moderna Covid-19 Vaccine  Bivalent Booster 14yrs & up 09/15/2023  . Moderna SARS-COV2 Booster Vaccination 06/12/2021  . PFIZER(Purple Top)SARS-COV-2 Vaccination 08/04/2019, 08/23/2019, 03/13/2020, 09/06/2021  . PNEUMOCOCCAL CONJUGATE-20 12/20/2021  . Pfizer Covid-19 Vaccine Bivalent Booster 36yrs & up 09/29/2022, 03/19/2023  . Pneumococcal Polysaccharide-23 08/18/2007, 03/28/2010, 12/20/2021  . Pneumococcal-Unspecified 02/28/2013  . RSV,unspecified 11/25/2022  . Td (Adult),5 Lf Tetanus Toxid, Preservative Free 09/14/2009  . Tdap 09/14/2009, 03/28/2010  . Tetanus 08/20/2023  . Zoster Recombinant(Shingrix) 03/10/2018, 06/15/2018  . Zoster, Live 03/28/2010, 12/17/2011, 03/10/2018, 10/07/2023   Pertinent  Health Maintenance Due  Topic Date Due  . INFLUENZA VACCINE  06/24/2024      08/18/2023  10:02 AM 02/23/2024    1:46 PM  Fall Risk  Falls in the past year? 1 0  Was there an injury with Fall? 1 0  Fall Risk Category Calculator 3 0  Patient at Risk for Falls Due to History of fall(s);Impaired balance/gait;Impaired mobility Impaired balance/gait;Impaired mobility  Fall risk Follow up Falls evaluation completed Falls evaluation completed   Functional Status Survey:    Vitals:   07/15/24 0835 07/15/24 0836  BP: (!) 100/56 102/63  Pulse: 64   Temp: 97.7 F (36.5 C)   SpO2: 95%   Weight: 200 lb 3.2 oz (90.8 kg)   Height: 5' 10 (1.778 m)    Body mass index is 28.73 kg/m. Physical Exam  Labs reviewed: Recent Labs    04/05/24 0549 04/05/24 1300 04/07/24 0539 04/12/24 0000 06/09/24 0000  NA 140  --  138  139 139  K 4.3  --  4.1 5.1 4.9  CL 109  --  102 102 101  CO2 23  --  25 25* 21  GLUCOSE 127*  --  121*  --   --   BUN 17  --  15 18 29*  CREATININE 1.06   < > 1.02 1.0 1.0  CALCIUM 9.0  --  8.9 9.2 9.4  MG 2.2  --  2.2  --   --   PHOS  --   --  3.4  --   --    < > = values in this interval not displayed.   Recent Labs    11/19/23 0000 03/03/24 0000  AST  --  33  ALT  --  39  ALKPHOS  --  64  ALBUMIN 4.3 4.0   Recent Labs    04/05/24 0549 04/05/24 1300 04/07/24 0539 04/12/24 0000  WBC 10.3 9.0 11.2* 7.2  HGB 14.1 14.0 15.9 16.3  HCT 43.5 43.9 47.4 48  MCV 96.0 96.7 92.9  --   PLT 206 208 236 239   Lab Results  Component Value Date   TSH 4.478 04/05/2024   No results found for: HGBA1C Lab Results  Component Value Date   CHOL 116 11/19/2023   HDL 55 11/19/2023   LDLCALC 46 11/19/2023   TRIG 73 11/19/2023    Significant Diagnostic Results in last 30 days:  CUP PACEART INCLINIC DEVICE CHECK Result Date: 07/12/2024 Normal in-clinic dual chamber pacemaker check. Presenting Rhythm: AP-VP . Routine testing of thresholds, sensing, and impedance demonstrate stable parameters. RA amplitude adjusted to chronic settings. Known AFL, low burden. Refuses OAC. 1 episode may represent NSVT, brief ~ 160 bpm. Estimated longevity 45yr26mo after programming changes. Pt enrolled in remote follow-up.   Assessment/Plan   Pacemaker follow-up Remote monitoring in place.  Afib/flutter Rate is controlled Followed by cardiology  Not on DOAC due to risk   HTN BP variable Continue losartan , continue to monitor.   Vascular dementia Off namenda  due to rash  MRI 01/15/24 moderate chronic microvascular ischemic disease and atrophy Galantamine    Chronic hip pain Managed with scheduled Tylenol . Currently pain-free, recent left knee pain episode. - Continue scheduled Tylenol  for arthritic pain.  Benign prostatic hyperplasia (BPH) Awaiting cystoscopy on June 10, 2024 due to hx of  hematuria Remains on Proscar  and FLomax .  Questionable findings on CT of the chest in May of 2025 with adenopathy in the chest and possible liver lesion

## 2024-07-18 ENCOUNTER — Encounter: Payer: Self-pay | Admitting: Adult Health

## 2024-07-18 ENCOUNTER — Other Ambulatory Visit (HOSPITAL_BASED_OUTPATIENT_CLINIC_OR_DEPARTMENT_OTHER): Payer: Self-pay | Admitting: Internal Medicine

## 2024-07-18 DIAGNOSIS — F01A0 Vascular dementia, mild, without behavioral disturbance, psychotic disturbance, mood disturbance, and anxiety: Secondary | ICD-10-CM | POA: Diagnosis not present

## 2024-07-18 DIAGNOSIS — R2689 Other abnormalities of gait and mobility: Secondary | ICD-10-CM | POA: Diagnosis not present

## 2024-07-18 DIAGNOSIS — M6289 Other specified disorders of muscle: Secondary | ICD-10-CM | POA: Diagnosis not present

## 2024-07-18 DIAGNOSIS — M62561 Muscle wasting and atrophy, not elsewhere classified, right lower leg: Secondary | ICD-10-CM | POA: Diagnosis not present

## 2024-07-18 DIAGNOSIS — R160 Hepatomegaly, not elsewhere classified: Secondary | ICD-10-CM

## 2024-07-18 DIAGNOSIS — K769 Liver disease, unspecified: Secondary | ICD-10-CM | POA: Insufficient documentation

## 2024-07-18 DIAGNOSIS — R41841 Cognitive communication deficit: Secondary | ICD-10-CM | POA: Diagnosis not present

## 2024-07-18 DIAGNOSIS — I483 Typical atrial flutter: Secondary | ICD-10-CM | POA: Diagnosis not present

## 2024-07-19 DIAGNOSIS — I10 Essential (primary) hypertension: Secondary | ICD-10-CM | POA: Diagnosis not present

## 2024-07-19 LAB — BASIC METABOLIC PANEL WITH GFR
BUN: 27 — AB (ref 4–21)
CO2: 25 — AB (ref 13–22)
Chloride: 103 (ref 99–108)
Creatinine: 1 (ref 0.6–1.3)
Glucose: 110
Potassium: 4.9 meq/L (ref 3.5–5.1)
Sodium: 139 (ref 137–147)

## 2024-07-19 LAB — COMPREHENSIVE METABOLIC PANEL WITH GFR: Calcium: 9.4 (ref 8.7–10.7)

## 2024-07-20 DIAGNOSIS — M62561 Muscle wasting and atrophy, not elsewhere classified, right lower leg: Secondary | ICD-10-CM | POA: Diagnosis not present

## 2024-07-20 DIAGNOSIS — R41841 Cognitive communication deficit: Secondary | ICD-10-CM | POA: Diagnosis not present

## 2024-07-20 DIAGNOSIS — F01A0 Vascular dementia, mild, without behavioral disturbance, psychotic disturbance, mood disturbance, and anxiety: Secondary | ICD-10-CM | POA: Diagnosis not present

## 2024-07-20 DIAGNOSIS — I483 Typical atrial flutter: Secondary | ICD-10-CM | POA: Diagnosis not present

## 2024-07-20 DIAGNOSIS — M6289 Other specified disorders of muscle: Secondary | ICD-10-CM | POA: Diagnosis not present

## 2024-07-20 DIAGNOSIS — R2689 Other abnormalities of gait and mobility: Secondary | ICD-10-CM | POA: Diagnosis not present

## 2024-07-22 DIAGNOSIS — R41841 Cognitive communication deficit: Secondary | ICD-10-CM | POA: Diagnosis not present

## 2024-07-22 DIAGNOSIS — I483 Typical atrial flutter: Secondary | ICD-10-CM | POA: Diagnosis not present

## 2024-07-22 DIAGNOSIS — F01A0 Vascular dementia, mild, without behavioral disturbance, psychotic disturbance, mood disturbance, and anxiety: Secondary | ICD-10-CM | POA: Diagnosis not present

## 2024-07-22 DIAGNOSIS — M6289 Other specified disorders of muscle: Secondary | ICD-10-CM | POA: Diagnosis not present

## 2024-07-22 DIAGNOSIS — M62561 Muscle wasting and atrophy, not elsewhere classified, right lower leg: Secondary | ICD-10-CM | POA: Diagnosis not present

## 2024-07-22 DIAGNOSIS — R2689 Other abnormalities of gait and mobility: Secondary | ICD-10-CM | POA: Diagnosis not present

## 2024-07-26 ENCOUNTER — Non-Acute Institutional Stay (SKILLED_NURSING_FACILITY): Payer: Self-pay | Admitting: Orthopedic Surgery

## 2024-07-26 ENCOUNTER — Encounter: Payer: Self-pay | Admitting: Orthopedic Surgery

## 2024-07-26 DIAGNOSIS — G8929 Other chronic pain: Secondary | ICD-10-CM

## 2024-07-26 DIAGNOSIS — F01A0 Vascular dementia, mild, without behavioral disturbance, psychotic disturbance, mood disturbance, and anxiety: Secondary | ICD-10-CM | POA: Diagnosis not present

## 2024-07-26 DIAGNOSIS — F0150 Vascular dementia without behavioral disturbance: Secondary | ICD-10-CM

## 2024-07-26 DIAGNOSIS — R41841 Cognitive communication deficit: Secondary | ICD-10-CM | POA: Diagnosis not present

## 2024-07-26 DIAGNOSIS — M25551 Pain in right hip: Secondary | ICD-10-CM | POA: Diagnosis not present

## 2024-07-26 DIAGNOSIS — R278 Other lack of coordination: Secondary | ICD-10-CM | POA: Diagnosis not present

## 2024-07-26 DIAGNOSIS — R2689 Other abnormalities of gait and mobility: Secondary | ICD-10-CM | POA: Diagnosis not present

## 2024-07-26 DIAGNOSIS — Z9181 History of falling: Secondary | ICD-10-CM | POA: Diagnosis not present

## 2024-07-26 MED ORDER — PREDNISONE 20 MG PO TABS: ORAL_TABLET | ORAL | Status: AC

## 2024-07-26 NOTE — Progress Notes (Signed)
 [facility] Room Number: 134-P Place of Service:  SNF [phone]) Provider:  Greig Cluster, NP  PCP: Charlanne Fredia CROME, MD  Patient Care Team: Charlanne Fredia CROME, MD as PCP - General (Internal Medicine) Thukkani, Arun K, MD as PCP - Cardiology (Cardiology) Cindie Ole DASEN, MD as PCP - Electrophysiology (Cardiology)  Extended Emergency Contact Information Primary Emergency Contact: Reddish,BETTY A Address: [address]          [place], [place] [address] [place] Home Phone: [phone] Relation: Spouse Secondary Emergency Contact: Nobles,Cindy Mobile Phone: [phone] Relation: Daughter  Code Status:  DNR Goals of care: Advanced Directive information    07/26/2024   12:34 PM  Advanced Directives  Does Patient Have a Medical Advance Directive? Yes  Type of Advance Directive Out of facility DNR (pink MOST or yellow form);Living will  Does patient want to make changes to medical advance directive? No - Patient declined     Chief Complaint  Patient presents with   Hip Pain    HPI:  Pt is a 89 y.o. male seen today for acute visit due to right hip pain.   He currently resides on the rehab unit at [facility]. PMH: Flutter/fibrillation, HTN, HLD, varicose veins, BPH, cognitive impairment and unstable gait.   He reports worsening right hip pain x 1 week. H/o right sciatica and OA of knees. He reports frequent ambulation and scheduled tylenol  are not having same affect on pain reduction. Pain rated 5/10 with ambulation, described as sharp, no radiation. Denies pain to right groin. No recent injury or fall. He has received steroid injections in the past. Unable to recall when. He cannot recall past orthopedic provider. He is able to ambulate with walker. Afebrile. Vitals stable.   MMSE 24/30 06/20/2024. No behaviors. Remains on galantamine .    Past Medical History:  Diagnosis Date   Arthritis    back and knees   Benign  prostatic hyperplasia with incomplete bladder emptying 06/15/2023   Hyperlipidemia 06/15/2023   Mild cognitive impairment 06/15/2023   Primary hypertension 06/15/2023   Primary osteoarthritis of both knees 06/15/2023   Varicose veins    Varicose veins of bilateral lower extremities with other complications 08/03/2013   IMO SNOMED Dx Update Oct 2024     Past Surgical History:  Procedure Laterality Date   INNER EAR SURGERY     PACEMAKER IMPLANT N/A 04/07/2024   Procedure: PACEMAKER IMPLANT;  Surgeon: Cindie Ole DASEN, MD;  Location: [facility] INVASIVE CV LAB;  Service: Cardiovascular;  Laterality: N/A;   SQUAMOUS CELL CARCINOMA EXCISION     on chest    Allergies  Allergen Reactions   Atorvastatin Other (See Comments)    Myalgia   Namenda  [Memantine ] Rash    Allergies as of 07/26/2024       Reactions   Atorvastatin Other (See Comments)   Myalgia   Namenda  [memantine ] Rash        Medication List        Accurate as of July 26, 2024 12:35 PM. If you have any questions, ask your nurse or doctor.          acetaminophen  500 MG tablet Commonly known as: TYLENOL  Take 1,000 mg by mouth 2 (two) times daily. May take one additional 1000mg  dose as needed for pain   acetaminophen  500 MG tablet Commonly known as: TYLENOL  Take 1,000 mg by mouth daily as needed (Every 24 hours prn for breakthrough pain).   Centrum Silver  Chew Chew 1 tablet by mouth in the morning.   diclofenac  Sodium 1 % Gel Commonly known as: Voltaren  Apply 4 g topically in the morning, at noon, and at bedtime.   Endit 20 % ointment Generic drug: zinc oxide Apply 1 Application topically every 8 (eight) hours as needed for irritation.   ezetimibe  10 MG tablet Commonly known as: ZETIA  Take 10 mg by mouth daily.   finasteride  5 MG tablet Commonly known as: PROSCAR  Take 5 mg by mouth in the morning.   fluticasone  0.005 % ointment Commonly known as: CUTIVATE  Apply 1 Application topically 2 (two) times  daily as needed (Apply to nose for skin issues).   fluticasone  50 MCG/ACT nasal spray Commonly known as: FLONASE  Place 1 spray into both nostrils 2 (two) times daily as needed for allergies or rhinitis.   galantamine  8 MG 24 hr capsule Commonly known as: RAZADYNE  ER Take 8 mg by mouth daily with breakfast.   losartan  25 MG tablet Commonly known as: COZAAR  Take 50 mg by mouth every morning.   menthol-cetylpyridinium 3 MG lozenge Commonly known as: CEPACOL Take 1 lozenge by mouth every 2 (two) hours as needed for sore throat.   polyethylene glycol 17 g packet Commonly known as: MIRALAX / GLYCOLAX Take 17 g by mouth as needed.   simvastatin  40 MG tablet Commonly known as: ZOCOR  Take 40 mg by mouth every evening.   tamsulosin  0.4 MG Caps capsule Commonly known as: FLOMAX  Take 0.4 mg by mouth daily.   triamcinolone  ointment 0.1 % Commonly known as: KENALOG  Apply 1 Application topically 2 (two) times daily as needed.        Review of Systems  Immunization History  Administered Date(s) Administered   Fluad Trivalent(High Dose 65+) 09/15/2023   INFLUENZA, HIGH DOSE SEASONAL PF 08/15/2015, 07/29/2016, 08/25/2017   Influenza Split 03/28/2010, 09/24/2010, 12/17/2011, 02/28/2013, 08/15/2013, 08/23/2014, 08/05/2020   Influenza-Unspecified 08/04/2018, 07/14/2019   Moderna Covid-19 Vaccine  Bivalent Booster 2yrs & up 09/15/2023   Moderna SARS-COV2 Booster Vaccination 06/12/2021   PFIZER(Purple Top)SARS-COV-2 Vaccination 08/04/2019, 08/23/2019, 03/13/2020, 09/06/2021   PNEUMOCOCCAL CONJUGATE-20 12/20/2021   Pfizer Covid-19 Vaccine Bivalent Booster 40yrs & up 09/29/2022, 03/19/2023   Pneumococcal Polysaccharide-23 08/18/2007, 03/28/2010, 12/20/2021   Pneumococcal-Unspecified 02/28/2013   RSV,unspecified 11/25/2022   Td (Adult),5 Lf Tetanus Toxid, Preservative Free 09/14/2009   Tdap 09/14/2009, 03/28/2010   Tetanus 08/20/2023   Zoster Recombinant(Shingrix) 03/10/2018,  06/15/2018   Zoster, Live 03/28/2010, 12/17/2011, 03/10/2018, 10/07/2023   Pertinent  Health Maintenance Due  Topic Date Due   INFLUENZA VACCINE  06/24/2024      08/18/2023   10:02 AM 02/23/2024    1:46 PM  Fall Risk  Falls in the past year? 1 0  Was there an injury with Fall? 1 0  Fall Risk Category Calculator 3 0  Patient at Risk for Falls Due to History of fall(s);Impaired balance/gait;Impaired mobility Impaired balance/gait;Impaired mobility  Fall risk Follow up Falls evaluation completed Falls evaluation completed   Functional Status Survey:    Vitals:   07/26/24 1226  BP: 108/72  Pulse: 60  Resp: 16  Temp: 97.6 F (36.4 C)  SpO2: 94%  Weight: 204 lb 6.4 oz (92.7 kg)  Height: 5' 10 (1.778 m)   Body mass index is 29.33 kg/m. Physical Exam Vitals reviewed.  Constitutional:      General: He is not in acute distress. HENT:     Head: Normocephalic.  Eyes:     General:  Right eye: No discharge.        Left eye: No discharge.  Cardiovascular:     Rate and Rhythm: Normal rate and regular rhythm.     Pulses: Normal pulses.     Heart sounds: Normal heart sounds.  Pulmonary:     Effort: Pulmonary effort is normal.     Breath sounds: Normal breath sounds.  Abdominal:     General: Bowel sounds are normal.     Palpations: Abdomen is soft.  Musculoskeletal:     Cervical back: Normal and neck supple.     Thoracic back: Normal.     Lumbar back: No swelling, deformity or tenderness. Normal range of motion.     Right hip: No tenderness or crepitus. Normal strength.     Comments: Limited ROM and crepitus to both knees  Skin:    General: Skin is warm.  Neurological:     General: No focal deficit present.     Mental Status: He is alert. Mental status is at baseline.     Motor: Weakness present.     Gait: Gait abnormal.  Psychiatric:        Mood and Affect: Mood normal.     Labs reviewed: Recent Labs    04/05/24 0549 04/05/24 1300 04/07/24 0539  04/12/24 0000 06/09/24 0000 07/19/24 0000  NA 140  --  138 139 139 139  K 4.3  --  4.1 5.1 4.9 4.9  CL 109  --  102 102 101 103  CO2 23  --  25 25* 21 25*  GLUCOSE 127*  --  121*  --   --   --   BUN 17  --  15 18 29* 27*  CREATININE 1.06   < > 1.02 1.0 1.0 1.0  CALCIUM 9.0  --  8.9 9.2 9.4 9.4  MG 2.2  --  2.2  --   --   --   PHOS  --   --  3.4  --   --   --    < > = values in this interval not displayed.   Recent Labs    11/19/23 0000 03/03/24 0000  AST  --  33  ALT  --  39  ALKPHOS  --  64  ALBUMIN 4.3 4.0   Recent Labs    04/05/24 0549 04/05/24 1300 04/07/24 0539 04/12/24 0000  WBC 10.3 9.0 11.2* 7.2  HGB 14.1 14.0 15.9 16.3  HCT 43.5 43.9 47.4 48  MCV 96.0 96.7 92.9  --   PLT 206 208 236 239   Lab Results  Component Value Date   TSH 4.478 04/05/2024   No results found for: HGBA1C Lab Results  Component Value Date   CHOL 116 11/19/2023   HDL 55 11/19/2023   LDLCALC 46 11/19/2023   TRIG 73 11/19/2023    Significant Diagnostic Results in last 30 days:  CUP PACEART INCLINIC DEVICE CHECK Result Date: 07/12/2024 Normal in-clinic dual chamber pacemaker check. Presenting Rhythm: AP-VP . Routine testing of thresholds, sensing, and impedance demonstrate stable parameters. RA amplitude adjusted to chronic settings. Known AFL, low burden. Refuses OAC. 1 episode may represent NSVT, brief ~ 160 bpm. Estimated longevity 33yr62mo after programming changes. Pt enrolled in remote follow-up.   Assessment/Plan 1. Chronic right hip pain (Primary) - increased pain > 1 week - scheduled tylenol  and voltaren  gel unsuccessful at reducing pain - no recent fall or injury - exam unremarkable - predniSONE  (DELTASONE ) 20 MG tablet; Take 2 tablets (  40 mg total) by mouth daily with breakfast for 5 days, THEN 1 tablet (20 mg total) daily with breakfast for 5 days. - consider xray right hip if no improvement  2. Vascular dementia without behavioral disturbance (HCC) - MMSE 24/30  05/2024 - no behaviors - weight stable - followed by neurology - ambulates with walker/ supervision - cont galantamine     Family/ staff Communication: plan discussed with patient and nurse  Labs/tests ordered: none

## 2024-07-27 DIAGNOSIS — F01A0 Vascular dementia, mild, without behavioral disturbance, psychotic disturbance, mood disturbance, and anxiety: Secondary | ICD-10-CM | POA: Diagnosis not present

## 2024-07-27 DIAGNOSIS — R41841 Cognitive communication deficit: Secondary | ICD-10-CM | POA: Diagnosis not present

## 2024-07-27 DIAGNOSIS — R278 Other lack of coordination: Secondary | ICD-10-CM | POA: Diagnosis not present

## 2024-07-27 DIAGNOSIS — R2689 Other abnormalities of gait and mobility: Secondary | ICD-10-CM | POA: Diagnosis not present

## 2024-07-27 DIAGNOSIS — Z9181 History of falling: Secondary | ICD-10-CM | POA: Diagnosis not present

## 2024-07-28 NOTE — Progress Notes (Signed)
 Remote pacemaker transmission.

## 2024-07-29 DIAGNOSIS — R2689 Other abnormalities of gait and mobility: Secondary | ICD-10-CM | POA: Diagnosis not present

## 2024-07-29 DIAGNOSIS — R278 Other lack of coordination: Secondary | ICD-10-CM | POA: Diagnosis not present

## 2024-07-29 DIAGNOSIS — Z9181 History of falling: Secondary | ICD-10-CM | POA: Diagnosis not present

## 2024-07-29 DIAGNOSIS — R41841 Cognitive communication deficit: Secondary | ICD-10-CM | POA: Diagnosis not present

## 2024-07-29 DIAGNOSIS — F01A0 Vascular dementia, mild, without behavioral disturbance, psychotic disturbance, mood disturbance, and anxiety: Secondary | ICD-10-CM | POA: Diagnosis not present

## 2024-08-01 DIAGNOSIS — R2689 Other abnormalities of gait and mobility: Secondary | ICD-10-CM | POA: Diagnosis not present

## 2024-08-01 DIAGNOSIS — F01A0 Vascular dementia, mild, without behavioral disturbance, psychotic disturbance, mood disturbance, and anxiety: Secondary | ICD-10-CM | POA: Diagnosis not present

## 2024-08-01 DIAGNOSIS — Z9181 History of falling: Secondary | ICD-10-CM | POA: Diagnosis not present

## 2024-08-01 DIAGNOSIS — R41841 Cognitive communication deficit: Secondary | ICD-10-CM | POA: Diagnosis not present

## 2024-08-01 DIAGNOSIS — R278 Other lack of coordination: Secondary | ICD-10-CM | POA: Diagnosis not present

## 2024-08-02 ENCOUNTER — Ambulatory Visit (HOSPITAL_BASED_OUTPATIENT_CLINIC_OR_DEPARTMENT_OTHER)
Admission: RE | Admit: 2024-08-02 | Discharge: 2024-08-02 | Disposition: A | Discharge status: Home / Self Care | Source: Ambulatory Visit | Attending: Internal Medicine | Admitting: Internal Medicine

## 2024-08-02 DIAGNOSIS — R160 Hepatomegaly, not elsewhere classified: Secondary | ICD-10-CM | POA: Diagnosis not present

## 2024-08-02 DIAGNOSIS — K769 Liver disease, unspecified: Secondary | ICD-10-CM | POA: Diagnosis not present

## 2024-08-02 DIAGNOSIS — N400 Enlarged prostate without lower urinary tract symptoms: Secondary | ICD-10-CM | POA: Diagnosis not present

## 2024-08-03 DIAGNOSIS — Z9181 History of falling: Secondary | ICD-10-CM | POA: Diagnosis not present

## 2024-08-03 DIAGNOSIS — R41841 Cognitive communication deficit: Secondary | ICD-10-CM | POA: Diagnosis not present

## 2024-08-03 DIAGNOSIS — R278 Other lack of coordination: Secondary | ICD-10-CM | POA: Diagnosis not present

## 2024-08-03 DIAGNOSIS — F01A0 Vascular dementia, mild, without behavioral disturbance, psychotic disturbance, mood disturbance, and anxiety: Secondary | ICD-10-CM | POA: Diagnosis not present

## 2024-08-03 DIAGNOSIS — R2689 Other abnormalities of gait and mobility: Secondary | ICD-10-CM | POA: Diagnosis not present

## 2024-08-05 DIAGNOSIS — R2689 Other abnormalities of gait and mobility: Secondary | ICD-10-CM | POA: Diagnosis not present

## 2024-08-05 DIAGNOSIS — R278 Other lack of coordination: Secondary | ICD-10-CM | POA: Diagnosis not present

## 2024-08-05 DIAGNOSIS — F01A0 Vascular dementia, mild, without behavioral disturbance, psychotic disturbance, mood disturbance, and anxiety: Secondary | ICD-10-CM | POA: Diagnosis not present

## 2024-08-05 DIAGNOSIS — R41841 Cognitive communication deficit: Secondary | ICD-10-CM | POA: Diagnosis not present

## 2024-08-05 DIAGNOSIS — Z9181 History of falling: Secondary | ICD-10-CM | POA: Diagnosis not present

## 2024-08-08 ENCOUNTER — Encounter: Payer: Self-pay | Admitting: Internal Medicine

## 2024-08-08 ENCOUNTER — Non-Acute Institutional Stay (SKILLED_NURSING_FACILITY): Payer: Self-pay | Admitting: Internal Medicine

## 2024-08-08 DIAGNOSIS — F0150 Vascular dementia without behavioral disturbance: Secondary | ICD-10-CM

## 2024-08-08 DIAGNOSIS — M25551 Pain in right hip: Secondary | ICD-10-CM | POA: Diagnosis not present

## 2024-08-08 DIAGNOSIS — G8929 Other chronic pain: Secondary | ICD-10-CM

## 2024-08-08 DIAGNOSIS — R41841 Cognitive communication deficit: Secondary | ICD-10-CM | POA: Diagnosis not present

## 2024-08-08 DIAGNOSIS — E782 Mixed hyperlipidemia: Secondary | ICD-10-CM

## 2024-08-08 DIAGNOSIS — Z9181 History of falling: Secondary | ICD-10-CM | POA: Diagnosis not present

## 2024-08-08 DIAGNOSIS — R278 Other lack of coordination: Secondary | ICD-10-CM | POA: Diagnosis not present

## 2024-08-08 DIAGNOSIS — M170 Bilateral primary osteoarthritis of knee: Secondary | ICD-10-CM | POA: Diagnosis not present

## 2024-08-08 DIAGNOSIS — M160 Bilateral primary osteoarthritis of hip: Secondary | ICD-10-CM | POA: Diagnosis not present

## 2024-08-08 DIAGNOSIS — R3914 Feeling of incomplete bladder emptying: Secondary | ICD-10-CM | POA: Diagnosis not present

## 2024-08-08 DIAGNOSIS — R2681 Unsteadiness on feet: Secondary | ICD-10-CM

## 2024-08-08 DIAGNOSIS — R2689 Other abnormalities of gait and mobility: Secondary | ICD-10-CM | POA: Diagnosis not present

## 2024-08-08 DIAGNOSIS — F01A0 Vascular dementia, mild, without behavioral disturbance, psychotic disturbance, mood disturbance, and anxiety: Secondary | ICD-10-CM | POA: Diagnosis not present

## 2024-08-08 DIAGNOSIS — N401 Enlarged prostate with lower urinary tract symptoms: Secondary | ICD-10-CM

## 2024-08-08 DIAGNOSIS — I10 Essential (primary) hypertension: Secondary | ICD-10-CM | POA: Diagnosis not present

## 2024-08-08 NOTE — Progress Notes (Signed)
 [facility] Room Number: 134-P Place of Service:  SNF [phone]) Provider:  Krystal Bring  PCP: Bring Fredia CROME, MD  Patient Care Team: Bring Fredia CROME, MD as PCP - General (Internal Medicine) Thukkani, Arun K, MD as PCP - Cardiology (Cardiology) Cindie Ole DASEN, MD as PCP - Electrophysiology (Cardiology)  Extended Emergency Contact Information Primary Emergency Contact: Pontillo,BETTY A Address: [address]          [place], [place] [address] [place] Home Phone: [phone] Relation: Spouse Secondary Emergency Contact: Dix,Cindy Mobile Phone: [phone] Relation: Daughter  Code Status:  DNR Goals of care: Advanced Directive information    08/08/2024   11:40 AM  Advanced Directives  Does Patient Have a Medical Advance Directive? Yes  Type of Advance Directive Living will;Out of facility DNR (pink MOST or yellow form)  Does patient want to make changes to medical advance directive? No - Patient declined     Chief Complaint  Patient presents with   Medical Management of Chronic Issues    Routine visit. Discuss the need for Influenza vaccine, Covid Booster, and AWV.    HPI:  Pt is a 89 y.o. male seen today for medical management of chronic diseases.    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  Was diagnosed with pneumonia in 6/25.  But states since then his cough has improved he still coughs up some mucus in the morning  Chronic right hip pain and knee pain.  Using Voltaren  was given IM dose of prednisone .  States the pain got better with that but continues to have the pain on the right hip.  Able to walk with his walker by using  the wheelchair when.  Patient also has had multiple falls as he is Kump[place] he slipped out of his recliner.  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  Past Medical History:  Diagnosis Date   Arthritis    back and knees   Benign prostatic hyperplasia with incomplete bladder emptying 06/15/2023   Hyperlipidemia 06/15/2023   Mild cognitive impairment 06/15/2023   Primary hypertension 06/15/2023   Primary osteoarthritis of both knees 06/15/2023   Varicose veins    Varicose veins of bilateral lower extremities with other complications 08/03/2013   IMO SNOMED Dx Update Oct 2024     Past Surgical History:  Procedure Laterality Date   INNER EAR SURGERY     PACEMAKER IMPLANT N/A 04/07/2024   Procedure: PACEMAKER IMPLANT;  Surgeon: Cindie Ole DASEN, MD;  Location: [facility];  Service: Cardiovascular;  Laterality: N/A;   SQUAMOUS CELL CARCINOMA EXCISION     on chest    Allergies  Allergen Reactions   Atorvastatin Other (See Comments)    Myalgia   Namenda  [Memantine ] Rash    Allergies as of 08/08/2024       Reactions   Atorvastatin Other (See Comments)   Myalgia   Namenda  [memantine ] Rash        Medication List        Accurate as of August 08, 2024 11:41 AM. If you have any questions, ask your nurse or doctor.  acetaminophen  500 MG tablet Commonly known as: TYLENOL  Take 1,000 mg by mouth 2 (two) times daily. May take one additional 1000mg  dose as needed for pain   acetaminophen  500 MG tablet Commonly known as: TYLENOL  Take 1,000 mg by mouth daily as needed (Every 24 hours prn for breakthrough pain).   Centrum Silver Chew Chew 1 tablet by mouth in the morning.   diclofenac  Sodium 1 % Gel Commonly known as: Voltaren  Apply 4 g topically in the morning, at noon, and at bedtime.   Endit 20 % ointment Generic drug: zinc oxide Apply 1 Application topically every 8 (eight) hours as needed for irritation.   ezetimibe  10 MG tablet Commonly  known as: ZETIA  Take 10 mg by mouth daily.   finasteride  5 MG tablet Commonly known as: PROSCAR  Take 5 mg by mouth in the morning.   fluticasone  0.005 % ointment Commonly known as: CUTIVATE  Apply 1 Application topically 2 (two) times daily.   galantamine  8 MG 24 hr capsule Commonly known as: RAZADYNE  ER Take 8 mg by mouth daily with breakfast.   losartan  25 MG tablet Commonly known as: COZAAR  Take 50 mg by mouth every morning.   menthol 3 MG lozenge Commonly known as: CEPACOL Take 1 lozenge by mouth every 2 (two) hours as needed for sore throat.   polyethylene glycol 17 g packet Commonly known as: MIRALAX / GLYCOLAX Take 17 g by mouth as needed.   simvastatin  40 MG tablet Commonly known as: ZOCOR  Take 40 mg by mouth every evening.   tamsulosin  0.4 MG Caps capsule Commonly known as: FLOMAX  Take 0.4 mg by mouth daily.   triamcinolone  ointment 0.1 % Commonly known as: KENALOG  Apply 1 Application topically 2 (two) times daily as needed.        Review of Systems  Constitutional:  Negative for activity change, appetite change and unexpected weight change.  HENT: Negative.    Respiratory:  Positive for cough. Negative for shortness of breath.   Cardiovascular:  Negative for leg swelling.  Gastrointestinal:  Negative for constipation.  Genitourinary:  Negative for frequency.  Musculoskeletal:  Positive for arthralgias and gait problem. Negative for myalgias.  Skin: Negative.  Negative for rash.  Neurological:  Negative for dizziness and weakness.  Psychiatric/Behavioral:  Negative for confusion and sleep disturbance.   All other systems reviewed and are negative.   Immunization History  Administered Date(s) Administered   Fluad Trivalent(High Dose 65+) 09/15/2023   INFLUENZA, HIGH DOSE SEASONAL PF 08/15/2015, 07/29/2016, 08/25/2017   Influenza Split 03/28/2010, 09/24/2010, 12/17/2011, 02/28/2013, 08/15/2013, 08/23/2014, 08/05/2020   Influenza-Unspecified  08/04/2018, 07/14/2019   Moderna Covid-19 Vaccine  Bivalent Booster 62yrs & up 09/15/2023   Moderna SARS-COV2 Booster Vaccination 06/12/2021   PFIZER(Purple Top)SARS-COV-2 Vaccination 08/04/2019, 08/23/2019, 03/13/2020, 09/06/2021   PNEUMOCOCCAL CONJUGATE-20 12/20/2021   Pfizer Covid-19 Vaccine Bivalent Booster 33yrs & up 09/29/2022, 03/19/2023   Pneumococcal Polysaccharide-23 08/18/2007, 03/28/2010, 12/20/2021   Pneumococcal-Unspecified 02/28/2013   RSV,unspecified 11/25/2022   Td (Adult),5 Lf Tetanus Toxid, Preservative Free 09/14/2009   Tdap 09/14/2009, 03/28/2010   Tetanus 08/20/2023   Zoster Recombinant(Shingrix) 03/10/2018, 06/15/2018   Zoster, Live 03/28/2010, 12/17/2011, 03/10/2018, 10/07/2023   Pertinent  Health Maintenance Due  Topic Date Due   Influenza Vaccine  06/24/2024      08/18/2023   10:02 AM 02/23/2024    1:46 PM  Fall Risk  Falls in the past year? 1 0  Was there an injury with Fall? 1 0  Fall Risk Category Calculator 3 0  Patient at  Risk for Falls Due to History of fall(s);Impaired balance/gait;Impaired mobility Impaired balance/gait;Impaired mobility  Fall risk Follow up Falls evaluation completed Falls evaluation completed   Functional Status Survey:    Vitals:   08/08/24 1130  BP: 134/82  Pulse: 75  Resp: 17  Temp: (!) 97.5 F (36.4 C)  SpO2: 95%  Weight: 204 lb 6.4 oz (92.7 kg)  Height: 5' 10 (1.778 m)   Body mass index is 29.33 kg/m. Physical Exam Vitals reviewed.  Constitutional:      Appearance: Normal appearance.  HENT:     Head: Normocephalic.     Nose: Nose normal.     Mouth/Throat:     Mouth: Mucous membranes are moist.     Pharynx: Oropharynx is clear.  Eyes:     Pupils: Pupils are equal, round, and reactive to light.  Cardiovascular:     Rate and Rhythm: Normal rate. Rhythm irregular.     Pulses: Normal pulses.     Heart sounds: No murmur heard. Pulmonary:     Effort: Pulmonary effort is normal. No respiratory distress.      Breath sounds: Normal breath sounds. No rales.  Abdominal:     General: Abdomen is flat. Bowel sounds are normal.     Palpations: Abdomen is soft.  Musculoskeletal:        General: No swelling.     Cervical back: Neck supple.  Skin:    General: Skin is warm.  Neurological:     General: No focal deficit present.     Mental Status: He is alert and oriented to person, place, and time.  Psychiatric:        Mood and Affect: Mood normal.        Thought Content: Thought content normal.     Labs reviewed: Recent Labs    04/05/24 0549 04/05/24 1300 04/07/24 0539 04/12/24 0000 06/09/24 0000 07/19/24 0000  NA 140  --  138 139 139 139  K 4.3  --  4.1 5.1 4.9 4.9  CL 109  --  102 102 101 103  CO2 23  --  25 25* 21 25*  GLUCOSE 127*  --  121*  --   --   --   BUN 17  --  15 18 29* 27*  CREATININE 1.06   < > 1.02 1.0 1.0 1.0  CALCIUM 9.0  --  8.9 9.2 9.4 9.4  MG 2.2  --  2.2  --   --   --   PHOS  --   --  3.4  --   --   --    < > = values in this interval not displayed.   Recent Labs    11/19/23 0000 03/03/24 0000  AST  --  33  ALT  --  39  ALKPHOS  --  64  ALBUMIN 4.3 4.0   Recent Labs    04/05/24 0549 04/05/24 1300 04/07/24 0539 04/12/24 0000  WBC 10.3 9.0 11.2* 7.2  HGB 14.1 14.0 15.9 16.3  HCT 43.5 43.9 47.4 48  MCV 96.0 96.7 92.9  --   PLT 206 208 236 239   Lab Results  Component Value Date   TSH 4.478 04/05/2024   No results found for: HGBA1C Lab Results  Component Value Date   CHOL 116 11/19/2023   HDL 55 11/19/2023   LDLCALC 46 11/19/2023   TRIG 73 11/19/2023    Significant Diagnostic Results in last 30 days:  CUP PACEART INCLINIC DEVICE CHECK Result  Date: 07/12/2024 Normal in-clinic dual chamber pacemaker check. Presenting Rhythm: AP-VP . Routine testing of thresholds, sensing, and impedance demonstrate stable parameters. RA amplitude adjusted to chronic settings. Known AFL, low burden. Refuses OAC. 1 episode may represent NSVT, brief ~ 160 bpm.  Estimated longevity 18yr5mo after programming changes. Pt enrolled in remote follow-up.   Assessment/Plan 1. Chronic right hip pain (Primary) Does not want to see ortho But due to his recent Falls Will check Xray Tylenol  and Voltaren  for now working  2. Primary osteoarthritis of both knees   3. Vascular dementia without behavioral disturbance (HCC) MMSE 25/30 On Galantamine  Had possible rash on Namenda   4. Primary hypertension Losartan  was increased few moths ago for high BP But he has had few falls No Dizzy Check Orthostatics  5. Benign prostatic hyperplasia with incomplete bladder emptying Flomax  and Proscar   6. Mixed hyperlipidemia Statin and Zetia  7. Unstable gait Power chair eval and OT Eval 8 Cough He does not want inhalers   Family/ staff Communication:   Labs/tests ordered:

## 2024-08-10 DIAGNOSIS — F01A0 Vascular dementia, mild, without behavioral disturbance, psychotic disturbance, mood disturbance, and anxiety: Secondary | ICD-10-CM | POA: Diagnosis not present

## 2024-08-10 DIAGNOSIS — R278 Other lack of coordination: Secondary | ICD-10-CM | POA: Diagnosis not present

## 2024-08-10 DIAGNOSIS — Z9181 History of falling: Secondary | ICD-10-CM | POA: Diagnosis not present

## 2024-08-10 DIAGNOSIS — R41841 Cognitive communication deficit: Secondary | ICD-10-CM | POA: Diagnosis not present

## 2024-08-10 DIAGNOSIS — R2689 Other abnormalities of gait and mobility: Secondary | ICD-10-CM | POA: Diagnosis not present

## 2024-08-12 DIAGNOSIS — R41841 Cognitive communication deficit: Secondary | ICD-10-CM | POA: Diagnosis not present

## 2024-08-12 DIAGNOSIS — R278 Other lack of coordination: Secondary | ICD-10-CM | POA: Diagnosis not present

## 2024-08-12 DIAGNOSIS — R2689 Other abnormalities of gait and mobility: Secondary | ICD-10-CM | POA: Diagnosis not present

## 2024-08-12 DIAGNOSIS — F01A0 Vascular dementia, mild, without behavioral disturbance, psychotic disturbance, mood disturbance, and anxiety: Secondary | ICD-10-CM | POA: Diagnosis not present

## 2024-08-12 DIAGNOSIS — Z9181 History of falling: Secondary | ICD-10-CM | POA: Diagnosis not present

## 2024-08-15 DIAGNOSIS — R2689 Other abnormalities of gait and mobility: Secondary | ICD-10-CM | POA: Diagnosis not present

## 2024-08-15 DIAGNOSIS — Z9181 History of falling: Secondary | ICD-10-CM | POA: Diagnosis not present

## 2024-08-15 DIAGNOSIS — R41841 Cognitive communication deficit: Secondary | ICD-10-CM | POA: Diagnosis not present

## 2024-08-15 DIAGNOSIS — F01A0 Vascular dementia, mild, without behavioral disturbance, psychotic disturbance, mood disturbance, and anxiety: Secondary | ICD-10-CM | POA: Diagnosis not present

## 2024-08-15 DIAGNOSIS — R278 Other lack of coordination: Secondary | ICD-10-CM | POA: Diagnosis not present

## 2024-08-16 DIAGNOSIS — Z9181 History of falling: Secondary | ICD-10-CM | POA: Diagnosis not present

## 2024-08-16 DIAGNOSIS — F01A0 Vascular dementia, mild, without behavioral disturbance, psychotic disturbance, mood disturbance, and anxiety: Secondary | ICD-10-CM | POA: Diagnosis not present

## 2024-08-16 DIAGNOSIS — R278 Other lack of coordination: Secondary | ICD-10-CM | POA: Diagnosis not present

## 2024-08-16 DIAGNOSIS — R41841 Cognitive communication deficit: Secondary | ICD-10-CM | POA: Diagnosis not present

## 2024-08-16 DIAGNOSIS — R2689 Other abnormalities of gait and mobility: Secondary | ICD-10-CM | POA: Diagnosis not present

## 2024-08-17 DIAGNOSIS — R278 Other lack of coordination: Secondary | ICD-10-CM | POA: Diagnosis not present

## 2024-08-17 DIAGNOSIS — R2689 Other abnormalities of gait and mobility: Secondary | ICD-10-CM | POA: Diagnosis not present

## 2024-08-17 DIAGNOSIS — Z9181 History of falling: Secondary | ICD-10-CM | POA: Diagnosis not present

## 2024-08-17 DIAGNOSIS — R41841 Cognitive communication deficit: Secondary | ICD-10-CM | POA: Diagnosis not present

## 2024-08-17 DIAGNOSIS — F01A0 Vascular dementia, mild, without behavioral disturbance, psychotic disturbance, mood disturbance, and anxiety: Secondary | ICD-10-CM | POA: Diagnosis not present

## 2024-08-18 DIAGNOSIS — R41841 Cognitive communication deficit: Secondary | ICD-10-CM | POA: Diagnosis not present

## 2024-08-18 DIAGNOSIS — F01A0 Vascular dementia, mild, without behavioral disturbance, psychotic disturbance, mood disturbance, and anxiety: Secondary | ICD-10-CM | POA: Diagnosis not present

## 2024-08-18 DIAGNOSIS — R2689 Other abnormalities of gait and mobility: Secondary | ICD-10-CM | POA: Diagnosis not present

## 2024-08-18 DIAGNOSIS — Z9181 History of falling: Secondary | ICD-10-CM | POA: Diagnosis not present

## 2024-08-18 DIAGNOSIS — R278 Other lack of coordination: Secondary | ICD-10-CM | POA: Diagnosis not present

## 2024-08-19 ENCOUNTER — Ambulatory Visit

## 2024-08-19 ENCOUNTER — Ambulatory Visit: Payer: Self-pay | Admitting: Cardiology

## 2024-08-19 DIAGNOSIS — R278 Other lack of coordination: Secondary | ICD-10-CM | POA: Diagnosis not present

## 2024-08-19 DIAGNOSIS — I4892 Unspecified atrial flutter: Secondary | ICD-10-CM

## 2024-08-19 DIAGNOSIS — R2689 Other abnormalities of gait and mobility: Secondary | ICD-10-CM | POA: Diagnosis not present

## 2024-08-19 DIAGNOSIS — R41841 Cognitive communication deficit: Secondary | ICD-10-CM | POA: Diagnosis not present

## 2024-08-19 DIAGNOSIS — Z9181 History of falling: Secondary | ICD-10-CM | POA: Diagnosis not present

## 2024-08-19 DIAGNOSIS — F01A0 Vascular dementia, mild, without behavioral disturbance, psychotic disturbance, mood disturbance, and anxiety: Secondary | ICD-10-CM | POA: Diagnosis not present

## 2024-08-19 DIAGNOSIS — I4891 Unspecified atrial fibrillation: Secondary | ICD-10-CM

## 2024-08-19 LAB — CUP PACEART REMOTE DEVICE CHECK
Battery Remaining Longevity: 139 mo
Battery Voltage: 3.18 V
Brady Statistic AP VP Percent: 67.61 %
Brady Statistic AP VS Percent: 0.24 %
Brady Statistic AS VP Percent: 28.56 %
Brady Statistic AS VS Percent: 3.6 %
Brady Statistic RA Percent Paced: 68.34 %
Brady Statistic RV Percent Paced: 96.15 %
Date Time Interrogation Session: 20250925195229
Implantable Lead Connection Status: 753985
Implantable Lead Connection Status: 753985
Implantable Lead Implant Date: 20250515
Implantable Lead Implant Date: 20250515
Implantable Lead Location: 753859
Implantable Lead Location: 753860
Implantable Lead Model: 3830
Implantable Lead Model: 5076
Implantable Pulse Generator Implant Date: 20250515
Lead Channel Impedance Value: 323 Ohm
Lead Channel Impedance Value: 342 Ohm
Lead Channel Impedance Value: 456 Ohm
Lead Channel Impedance Value: 475 Ohm
Lead Channel Pacing Threshold Amplitude: 0.625 V
Lead Channel Pacing Threshold Amplitude: 0.75 V
Lead Channel Pacing Threshold Pulse Width: 0.4 ms
Lead Channel Pacing Threshold Pulse Width: 0.4 ms
Lead Channel Sensing Intrinsic Amplitude: 1.25 mV
Lead Channel Sensing Intrinsic Amplitude: 1.25 mV
Lead Channel Sensing Intrinsic Amplitude: 28 mV
Lead Channel Sensing Intrinsic Amplitude: 28 mV
Lead Channel Setting Pacing Amplitude: 2 V
Lead Channel Setting Pacing Amplitude: 2 V
Lead Channel Setting Pacing Pulse Width: 0.4 ms
Lead Channel Setting Sensing Sensitivity: 0.9 mV
Zone Setting Status: 755011
Zone Setting Status: 755011

## 2024-08-22 DIAGNOSIS — F01A0 Vascular dementia, mild, without behavioral disturbance, psychotic disturbance, mood disturbance, and anxiety: Secondary | ICD-10-CM | POA: Diagnosis not present

## 2024-08-22 DIAGNOSIS — R41841 Cognitive communication deficit: Secondary | ICD-10-CM | POA: Diagnosis not present

## 2024-08-22 DIAGNOSIS — Z9181 History of falling: Secondary | ICD-10-CM | POA: Diagnosis not present

## 2024-08-22 DIAGNOSIS — R278 Other lack of coordination: Secondary | ICD-10-CM | POA: Diagnosis not present

## 2024-08-22 DIAGNOSIS — R2689 Other abnormalities of gait and mobility: Secondary | ICD-10-CM | POA: Diagnosis not present

## 2024-08-23 DIAGNOSIS — Z9181 History of falling: Secondary | ICD-10-CM | POA: Diagnosis not present

## 2024-08-23 DIAGNOSIS — Z23 Encounter for immunization: Secondary | ICD-10-CM | POA: Diagnosis not present

## 2024-08-23 DIAGNOSIS — R41841 Cognitive communication deficit: Secondary | ICD-10-CM | POA: Diagnosis not present

## 2024-08-23 DIAGNOSIS — F01A0 Vascular dementia, mild, without behavioral disturbance, psychotic disturbance, mood disturbance, and anxiety: Secondary | ICD-10-CM | POA: Diagnosis not present

## 2024-08-23 DIAGNOSIS — R2689 Other abnormalities of gait and mobility: Secondary | ICD-10-CM | POA: Diagnosis not present

## 2024-08-23 DIAGNOSIS — R278 Other lack of coordination: Secondary | ICD-10-CM | POA: Diagnosis not present

## 2024-08-24 DIAGNOSIS — R278 Other lack of coordination: Secondary | ICD-10-CM | POA: Diagnosis not present

## 2024-08-24 DIAGNOSIS — R2689 Other abnormalities of gait and mobility: Secondary | ICD-10-CM | POA: Diagnosis not present

## 2024-08-24 DIAGNOSIS — Z9181 History of falling: Secondary | ICD-10-CM | POA: Diagnosis not present

## 2024-08-24 DIAGNOSIS — F01A0 Vascular dementia, mild, without behavioral disturbance, psychotic disturbance, mood disturbance, and anxiety: Secondary | ICD-10-CM | POA: Diagnosis not present

## 2024-08-24 DIAGNOSIS — R41841 Cognitive communication deficit: Secondary | ICD-10-CM | POA: Diagnosis not present

## 2024-08-24 NOTE — Progress Notes (Signed)
 Remote PPM Transmission

## 2024-08-25 DIAGNOSIS — Z9181 History of falling: Secondary | ICD-10-CM | POA: Diagnosis not present

## 2024-08-25 DIAGNOSIS — R41841 Cognitive communication deficit: Secondary | ICD-10-CM | POA: Diagnosis not present

## 2024-08-25 DIAGNOSIS — R2689 Other abnormalities of gait and mobility: Secondary | ICD-10-CM | POA: Diagnosis not present

## 2024-08-25 DIAGNOSIS — F01A0 Vascular dementia, mild, without behavioral disturbance, psychotic disturbance, mood disturbance, and anxiety: Secondary | ICD-10-CM | POA: Diagnosis not present

## 2024-08-25 DIAGNOSIS — R278 Other lack of coordination: Secondary | ICD-10-CM | POA: Diagnosis not present

## 2024-08-26 DIAGNOSIS — F01A0 Vascular dementia, mild, without behavioral disturbance, psychotic disturbance, mood disturbance, and anxiety: Secondary | ICD-10-CM | POA: Diagnosis not present

## 2024-08-26 DIAGNOSIS — R2689 Other abnormalities of gait and mobility: Secondary | ICD-10-CM | POA: Diagnosis not present

## 2024-08-26 DIAGNOSIS — Z9181 History of falling: Secondary | ICD-10-CM | POA: Diagnosis not present

## 2024-08-26 DIAGNOSIS — R41841 Cognitive communication deficit: Secondary | ICD-10-CM | POA: Diagnosis not present

## 2024-08-26 DIAGNOSIS — R278 Other lack of coordination: Secondary | ICD-10-CM | POA: Diagnosis not present

## 2024-08-27 DIAGNOSIS — F01A0 Vascular dementia, mild, without behavioral disturbance, psychotic disturbance, mood disturbance, and anxiety: Secondary | ICD-10-CM | POA: Diagnosis not present

## 2024-08-27 DIAGNOSIS — R2689 Other abnormalities of gait and mobility: Secondary | ICD-10-CM | POA: Diagnosis not present

## 2024-08-27 DIAGNOSIS — R278 Other lack of coordination: Secondary | ICD-10-CM | POA: Diagnosis not present

## 2024-08-27 DIAGNOSIS — Z9181 History of falling: Secondary | ICD-10-CM | POA: Diagnosis not present

## 2024-08-27 DIAGNOSIS — R41841 Cognitive communication deficit: Secondary | ICD-10-CM | POA: Diagnosis not present

## 2024-08-29 DIAGNOSIS — F01A0 Vascular dementia, mild, without behavioral disturbance, psychotic disturbance, mood disturbance, and anxiety: Secondary | ICD-10-CM | POA: Diagnosis not present

## 2024-08-29 DIAGNOSIS — Z9181 History of falling: Secondary | ICD-10-CM | POA: Diagnosis not present

## 2024-08-29 DIAGNOSIS — R41841 Cognitive communication deficit: Secondary | ICD-10-CM | POA: Diagnosis not present

## 2024-08-29 DIAGNOSIS — R278 Other lack of coordination: Secondary | ICD-10-CM | POA: Diagnosis not present

## 2024-08-29 DIAGNOSIS — R2689 Other abnormalities of gait and mobility: Secondary | ICD-10-CM | POA: Diagnosis not present

## 2024-08-30 DIAGNOSIS — R2689 Other abnormalities of gait and mobility: Secondary | ICD-10-CM | POA: Diagnosis not present

## 2024-08-30 DIAGNOSIS — Z9181 History of falling: Secondary | ICD-10-CM | POA: Diagnosis not present

## 2024-08-30 DIAGNOSIS — E1159 Type 2 diabetes mellitus with other circulatory complications: Secondary | ICD-10-CM | POA: Diagnosis not present

## 2024-08-30 DIAGNOSIS — L84 Corns and callosities: Secondary | ICD-10-CM | POA: Diagnosis not present

## 2024-08-30 DIAGNOSIS — B351 Tinea unguium: Secondary | ICD-10-CM | POA: Diagnosis not present

## 2024-08-30 DIAGNOSIS — F01A0 Vascular dementia, mild, without behavioral disturbance, psychotic disturbance, mood disturbance, and anxiety: Secondary | ICD-10-CM | POA: Diagnosis not present

## 2024-08-30 DIAGNOSIS — R278 Other lack of coordination: Secondary | ICD-10-CM | POA: Diagnosis not present

## 2024-08-30 DIAGNOSIS — R41841 Cognitive communication deficit: Secondary | ICD-10-CM | POA: Diagnosis not present

## 2024-08-31 DIAGNOSIS — F01A0 Vascular dementia, mild, without behavioral disturbance, psychotic disturbance, mood disturbance, and anxiety: Secondary | ICD-10-CM | POA: Diagnosis not present

## 2024-08-31 DIAGNOSIS — R2689 Other abnormalities of gait and mobility: Secondary | ICD-10-CM | POA: Diagnosis not present

## 2024-08-31 DIAGNOSIS — Z9181 History of falling: Secondary | ICD-10-CM | POA: Diagnosis not present

## 2024-08-31 DIAGNOSIS — R41841 Cognitive communication deficit: Secondary | ICD-10-CM | POA: Diagnosis not present

## 2024-08-31 DIAGNOSIS — R278 Other lack of coordination: Secondary | ICD-10-CM | POA: Diagnosis not present

## 2024-09-01 ENCOUNTER — Non-Acute Institutional Stay (SKILLED_NURSING_FACILITY): Payer: Self-pay | Admitting: Adult Health

## 2024-09-01 ENCOUNTER — Encounter: Payer: Self-pay | Admitting: Adult Health

## 2024-09-01 DIAGNOSIS — R278 Other lack of coordination: Secondary | ICD-10-CM | POA: Diagnosis not present

## 2024-09-01 DIAGNOSIS — Z0000 Encounter for general adult medical examination without abnormal findings: Secondary | ICD-10-CM | POA: Diagnosis not present

## 2024-09-01 DIAGNOSIS — R41841 Cognitive communication deficit: Secondary | ICD-10-CM | POA: Diagnosis not present

## 2024-09-01 DIAGNOSIS — R2689 Other abnormalities of gait and mobility: Secondary | ICD-10-CM | POA: Diagnosis not present

## 2024-09-01 DIAGNOSIS — Z9181 History of falling: Secondary | ICD-10-CM | POA: Diagnosis not present

## 2024-09-01 DIAGNOSIS — F01A0 Vascular dementia, mild, without behavioral disturbance, psychotic disturbance, mood disturbance, and anxiety: Secondary | ICD-10-CM | POA: Diagnosis not present

## 2024-09-01 NOTE — Patient Instructions (Signed)
 [name] , Thank you for taking time to come for your Medicare Wellness Visit. I appreciate your ongoing commitment to your health goals. Please review the following plan we discussed and let me know if I can assist you in the future.   Screening recommendations/referrals: Colonoscopy aged out Recommended yearly ophthalmology/optometry visit for glaucoma screening and checkup Recommended yearly dental visit for hygiene and checkup  Vaccinations: Influenza vaccine due annually in September/October Pneumococcal vaccine up to date  Tdap vaccine up to date  Shingles vaccine up to date     Advanced directives: reviewed   Conditions/risks identified: fall risk  Attends exercise classes and works with PT  Next appointment: 1 year   Preventive Care 17 Years and Older, Male Preventive care refers to lifestyle choices and visits with your health care provider that can promote health and wellness. What does preventive care include? A yearly physical exam. This is also called an annual well check. Dental exams once or twice a year. Routine eye exams. Ask your health care provider how often you should have your eyes checked. Personal lifestyle choices, including: Daily care of your teeth and gums. Regular physical activity. Eating a healthy diet. Avoiding tobacco and drug use. Limiting alcohol  use. Practicing safe sex. Taking low doses of aspirin every day. Taking vitamin and mineral supplements as recommended by your health care provider. What happens during an annual well check? The services and screenings done by your health care provider during your annual well check will depend on your age, overall health, lifestyle risk factors, and family history of disease. Counseling  Your health care provider may ask you questions about your: Alcohol  use. Tobacco use. Drug use. Emotional well-being. Home and relationship well-being. Sexual activity. Eating habits. History of falls. Memory  and ability to understand (cognition). Work and work Astronomer. Screening  You may have the following tests or measurements: Height, weight, and BMI. Blood pressure. Lipid and cholesterol levels. These may be checked every 5 years, or more frequently if you are over 69 years old. Skin check. Lung cancer screening. You may have this screening every year starting at age 14 if you have a 30-pack-year history of smoking and currently smoke or have quit within the past 15 years. Fecal occult blood test (FOBT) of the stool. You may have this test every year starting at age 62. Flexible sigmoidoscopy or colonoscopy. You may have a sigmoidoscopy every 5 years or a colonoscopy every 10 years starting at age 58. Prostate cancer screening. Recommendations will vary depending on your family history and other risks. Hepatitis C blood test. Hepatitis B blood test. Sexually transmitted disease (STD) testing. Diabetes screening. This is done by checking your blood sugar (glucose) after you have not eaten for a while (fasting). You may have this done every 1-3 years. Abdominal aortic aneurysm (AAA) screening. You may need this if you are a current or former smoker. Osteoporosis. You may be screened starting at age 22 if you are at high risk. Talk with your health care provider about your test results, treatment options, and if necessary, the need for more tests. Vaccines  Your health care provider may recommend certain vaccines, such as: Influenza vaccine. This is recommended every year. Tetanus, diphtheria, and acellular pertussis (Tdap, Td) vaccine. You may need a Td booster every 10 years. Zoster vaccine. You may need this after age 3. Pneumococcal 13-valent conjugate (PCV13) vaccine. One dose is recommended after age 23. Pneumococcal polysaccharide (PPSV23) vaccine. One dose is recommended after age  65. Talk to your health care provider about which screenings and vaccines you need and how often you  need them. This information is not intended to replace advice given to you by your health care provider. Make sure you discuss any questions you have with your health care provider. Document Released: 12/07/2015 Document Revised: 07/30/2016 Document Reviewed: 09/11/2015 Elsevier Interactive Patient Education  2017 [employer].  Fall Prevention in the Home Falls can cause injuries. They can happen to people of all ages. There are many things you can do to make your home safe and to help prevent falls. What can I do on the outside of my home? Regularly fix the edges of walkways and driveways and fix any cracks. Remove anything that might make you trip as you walk through a door, such as a raised step or threshold. Trim any bushes or trees on the path to your home. Use bright outdoor lighting. Clear any walking paths of anything that might make someone trip, such as rocks or tools. Regularly check to see if handrails are loose or broken. Make sure that both sides of any steps have handrails. Any raised decks and porches should have guardrails on the edges. Have any leaves, snow, or ice cleared regularly. Use sand or salt on walking paths during winter. Clean up any spills in your garage right away. This includes oil or grease spills. What can I do in the bathroom? Use night lights. Install grab bars by the toilet and in the tub and shower. Do not use towel bars as grab bars. Use non-skid mats or decals in the tub or shower. If you need to sit down in the shower, use a plastic, non-slip stool. Keep the floor dry. Clean up any water that spills on the floor as soon as it happens. Remove soap buildup in the tub or shower regularly. Attach bath mats securely with double-sided non-slip rug tape. Do not have throw rugs and other things on the floor that can make you trip. What can I do in the bedroom? Use night lights. Make sure that you have a light by your bed that is easy to reach. Do not  use any sheets or blankets that are too big for your bed. They should not hang down onto the floor. Have a firm chair that has side arms. You can use this for support while you get dressed. Do not have throw rugs and other things on the floor that can make you trip. What can I do in the kitchen? Clean up any spills right away. Avoid walking on wet floors. Keep items that you use a lot in easy-to-reach places. If you need to reach something above you, use a strong step stool that has a grab bar. Keep electrical cords out of the way. Do not use floor polish or wax that makes floors slippery. If you must use wax, use non-skid floor wax. Do not have throw rugs and other things on the floor that can make you trip. What can I do with my stairs? Do not leave any items on the stairs. Make sure that there are handrails on both sides of the stairs and use them. Fix handrails that are broken or loose. Make sure that handrails are as long as the stairways. Check any carpeting to make sure that it is firmly attached to the stairs. Fix any carpet that is loose or worn. Avoid having throw rugs at the top or bottom of the stairs. If you do have  throw rugs, attach them to the floor with carpet tape. Make sure that you have a light switch at the top of the stairs and the bottom of the stairs. If you do not have them, ask someone to add them for you. What else can I do to help prevent falls? Wear shoes that: Do not have high heels. Have rubber bottoms. Are comfortable and fit you well. Are closed at the toe. Do not wear sandals. If you use a stepladder: Make sure that it is fully opened. Do not climb a closed stepladder. Make sure that both sides of the stepladder are locked into place. Ask someone to hold it for you, if possible. Clearly mark and make sure that you can see: Any grab bars or handrails. First and last steps. Where the edge of each step is. Use tools that help you move around (mobility  aids) if they are needed. These include: Canes. Walkers. Scooters. Crutches. Turn on the lights when you go into a dark area. Replace any light bulbs as soon as they burn out. Set up your furniture so you have a clear path. Avoid moving your furniture around. If any of your floors are uneven, fix them. If there are any pets around you, be aware of where they are. Review your medicines with your doctor. Some medicines can make you feel dizzy. This can increase your chance of falling. Ask your doctor what other things that you can do to help prevent falls. This information is not intended to replace advice given to you by your health care provider. Make sure you discuss any questions you have with your health care provider. Document Released: 09/06/2009 Document Revised: 04/17/2016 Document Reviewed: 12/15/2014 Elsevier Interactive Patient Education  2017 [employer].

## 2024-09-01 NOTE — Progress Notes (Addendum)
 Subjective:   Daniel Moses is a 89 y.o. male who presents for Medicare Annual/Subsequent preventive examination at [facility]  Visit Complete: In person  Patient Medicare AWV questionnaire was completed by the patient on 09/01/2024; I have confirmed that all information answered by patient is correct and no changes since this date.  Cardiac Risk Factors include: advanced age (>68men, >67 women);hypertension;male gender     Objective:    Today's Vitals   09/01/24 0949  BP: 119/73  Pulse: 62  Resp: 16  Temp: 97.6 F (36.4 C)  SpO2: 95%  Weight: 208 lb 6.4 oz (94.5 kg)  Height: 5' 10 (1.778 m)   Body mass index is 29.9 kg/m.     09/01/2024    9:56 AM 08/08/2024   11:40 AM 07/26/2024   12:34 PM 04/26/2024    3:48 PM 04/05/2024    6:05 PM 02/23/2024    1:47 PM 01/11/2024    2:37 PM  Advanced Directives  Does Patient Have a Medical Advance Directive? Yes Yes Yes Yes No Yes Yes  Type of Advance Directive Living will;Out of facility DNR (pink MOST or yellow form) Living will;Out of facility DNR (pink MOST or yellow form) Out of facility DNR (pink MOST or yellow form);Living will Living will;Out of facility DNR (pink MOST or yellow form)  Living will;Out of facility DNR (pink MOST or yellow form) Living will;Out of facility DNR (pink MOST or yellow form)  Does patient want to make changes to medical advance directive? No - Patient declined No - Patient declined No - Patient declined No - Patient declined   No - Patient declined  Would patient like information on creating a medical advance directive?     No - Patient declined    Pre-existing out of facility DNR order (yellow form or pink MOST form)      Yellow form placed in chart (order not valid for inpatient use)     Current Medications (verified) Outpatient Encounter Medications as of 09/01/2024  Medication Sig   acetaminophen  (TYLENOL ) 500 MG tablet Take 1,000 mg by mouth 2 (two) times daily. May  take one additional 1000mg  dose as needed for pain   acetaminophen  (TYLENOL ) 500 MG tablet Take 1,000 mg by mouth daily as needed (Every 24 hours prn for breakthrough pain).   diclofenac  Sodium (VOLTAREN ) 1 % GEL Apply 4 g topically in the morning, at noon, and at bedtime.   ezetimibe  (ZETIA ) 10 MG tablet Take 10 mg by mouth daily.   finasteride  (PROSCAR ) 5 MG tablet Take 5 mg by mouth in the morning.   fluticasone  (CUTIVATE ) 0.005 % ointment Apply 1 Application topically 2 (two) times daily.   galantamine  (RAZADYNE  ER) 8 MG 24 hr capsule Take 8 mg by mouth daily with breakfast.   losartan  (COZAAR ) 25 MG tablet Take 50 mg by mouth every morning.   menthol-cetylpyridinium (CEPACOL) 3 MG lozenge Take 1 lozenge by mouth every 2 (two) hours as needed for sore throat.   Multiple Vitamins-Minerals (CENTRUM SILVER) CHEW Chew 1 tablet by mouth in the morning.   ondansetron (ZOFRAN) 4 MG tablet Take 4 mg by mouth every 6 (six) hours as needed for nausea or vomiting.   polyethylene glycol (MIRALAX / GLYCOLAX) 17 g packet Take 17 g by mouth as needed.   simvastatin  (ZOCOR ) 40 MG tablet Take 40 mg by mouth every evening.   tamsulosin  (FLOMAX ) 0.4 MG CAPS capsule Take 0.4 mg by mouth daily.  triamcinolone  ointment (KENALOG ) 0.1 % Apply 1 Application topically 2 (two) times daily.   zinc oxide (ENDIT) 20 % ointment Apply 1 Application topically every 8 (eight) hours as needed for irritation.   No facility-administered encounter medications on file as of 09/01/2024.    Allergies (verified) Atorvastatin and Namenda  [memantine ]   History: Past Medical History:  Diagnosis Date   Arthritis    back and knees   Benign prostatic hyperplasia with incomplete bladder emptying 06/15/2023   Hyperlipidemia 06/15/2023   Mild cognitive impairment 06/15/2023   Primary hypertension 06/15/2023   Primary osteoarthritis of both knees 06/15/2023   Varicose veins    Varicose veins of bilateral lower extremities with  other complications 08/03/2013   IMO SNOMED Dx Update Oct 2024     Past Surgical History:  Procedure Laterality Date   INNER EAR SURGERY     PACEMAKER IMPLANT N/A 04/07/2024   Procedure: PACEMAKER IMPLANT;  Surgeon: Cindie Ole DASEN, MD;  Location: [facility];  Service: Cardiovascular;  Laterality: N/A;   SQUAMOUS CELL CARCINOMA EXCISION     on chest   Family History  Problem Relation Age of Onset   Hypertension Mother    Other Mother        varicose veins   Cancer Father    Social History   Socioeconomic History   Marital status: Married    Spouse name: Not on file   Number of children: Not on file   Years of education: Not on file   Highest education level: Not on file  Occupational History   Not on file  Tobacco Use   Smoking status: Some Days    Types: Cigars   Smokeless tobacco: Never  Substance and Sexual Activity   Alcohol  use: No   Drug use: No   Sexual activity: Not on file  Other Topics Concern   Not on file  Social History Narrative   Not on file   Social Drivers of Health   Financial Resource Strain: Not on file  Food Insecurity: No Food Insecurity (04/06/2024)   Hunger Vital Sign    Worried About Running Out of Food in the Last Year: Never true    Ran Out of Food in the Last Year: Never true  Transportation Needs: No Transportation Needs (04/06/2024)   PRAPARE - Administrator, Civil Service (Medical): No    Lack of Transportation (Non-Medical): No  Physical Activity: Not on file  Stress: Not on file  Social Connections: Moderately Isolated (04/06/2024)   Social Connection and Isolation Panel    Frequency of Communication with Friends and Family: More than three times a week    Frequency of Social Gatherings with Friends and Family: More than three times a week    Attends Religious Services: Never    Database administrator or Organizations: No    Attends Engineer, structural: Never    Marital Status: Married     Tobacco Counseling Ready to quit: Not Answered Counseling given: Not Answered   Clinical Intake:  Pre-visit preparation completed: No  Pain : 0-10 Pain Type: Chronic pain Pain Location: Knee Pain Orientation: Right, Left Pain Descriptors / Indicators: Aching Pain Onset: More than a month ago Pain Frequency: Intermittent     Nutritional Status: BMI 25 -29 Overweight Diabetes: No  How often do you need to have someone help you when you read instructions, pamphlets, or other written materials from your doctor or pharmacy?: 2 - Rarely What  is the last grade level you completed in school?: physican     Information entered by :: Tawni America NP   Activities of Daily Living    09/01/2024    2:57 PM 04/06/2024    4:46 PM  In your present state of health, do you have any difficulty performing the following activities:  Hearing? 1   Vision? 1   Dressing or bathing? 1   Doing errands, shopping? 1 0  Preparing Food and eating ? Y   Using the Toilet? Y   In the past six months, have you accidently leaked urine? Y   Do you have problems with loss of bowel control? N   Managing your Medications? Y   Managing your Finances? Y   Housekeeping or managing your Housekeeping? Y     Patient Care Team: Charlanne Fredia CROME, MD as PCP - General (Internal Medicine) Thukkani, Arun K, MD as PCP - Cardiology (Cardiology) Cindie Ole DASEN, MD as PCP - Electrophysiology (Cardiology)  Indicate any recent Medical Services you may have received from other than [facility] providers in the past year (date may be approximate).     Assessment:   This is a routine wellness examination for [place].  Hearing/Vision screen No results found.   Goals Addressed   None    Depression Screen    09/01/2024    2:05 PM 02/23/2024    1:46 PM 08/18/2023   10:02 AM  PHQ 2/9 Scores  PHQ - 2 Score 0 0 0    Fall Risk    09/01/2024    2:04 PM 02/23/2024    1:46 PM 08/18/2023   10:02 AM  Fall Risk    Falls in the past year? 1 0 1  Number falls in past yr: 1 0 1  Injury with Fall? 1 0 1  Risk for fall due to : History of fall(s) Impaired balance/gait;Impaired mobility History of fall(s);Impaired balance/gait;Impaired mobility  Follow up Falls evaluation completed Falls evaluation completed Falls evaluation completed    MEDICARE RISK AT HOME: Medicare Risk at Home Any stairs in or around the home?: No If so, are there any without handrails?: No Home free of loose throw rugs in walkways, pet beds, electrical cords, etc?: Yes Adequate lighting in your home to reduce risk of falls?: Yes Life alert?: No Use of a cane, walker or w/c?: Yes Grab bars in the bathroom?: Yes Shower chair or bench in shower?: Yes Elevated toilet seat or a handicapped toilet?: Yes  TIMED UP AND GO:  Was the test performed?  No    Cognitive Function:    01/19/2024    3:38 PM  MMSE - Mini Mental State Exam  Orientation to time 4  Orientation to Place 5  Registration 3  Attention/ Calculation 3  Recall 2  Language- name 2 objects 2  Language- repeat 1  Language- follow 3 step command 3  Language- read & follow direction 1  Write a sentence 1  Copy design 0  Total score 25        09/01/2024    2:05 PM  6CIT Screen  What Year? 0 points  What month? 0 points  What time? 0 points  Count back from 20 0 points  Months in reverse 4 points  Repeat phrase 0 points  Total Score 4 points    Immunizations Immunization History  Administered Date(s) Administered   Fluad Trivalent(High Dose 65+) 09/15/2023   INFLUENZA, HIGH DOSE SEASONAL PF 08/15/2015, 07/29/2016, 08/25/2017, 08/23/2024  Influenza Split 03/28/2010, 09/24/2010, 12/17/2011, 02/28/2013, 08/15/2013, 08/23/2014, 08/05/2020   Influenza-Unspecified 08/04/2018, 07/14/2019   Moderna Covid-19 Fall Seasonal Vaccine 77yrs & older 08/23/2024   Moderna Covid-19 Vaccine  Bivalent Booster 3yrs & up 09/15/2023   Moderna SARS-COV2 Booster  Vaccination 06/12/2021   PFIZER(Purple Top)SARS-COV-2 Vaccination 08/04/2019, 08/23/2019, 03/13/2020, 09/06/2021   PNEUMOCOCCAL CONJUGATE-20 12/20/2021   Pfizer Covid-19 Vaccine Bivalent Booster 63yrs & up 09/29/2022, 03/19/2023   Pneumococcal Polysaccharide-23 08/18/2007, 03/28/2010, 12/20/2021   Pneumococcal-Unspecified 02/28/2013   RSV,unspecified 11/25/2022   Td (Adult),5 Lf Tetanus Toxid, Preservative Free 09/14/2009   Tdap 09/14/2009, 03/28/2010   Tetanus 08/20/2023   Zoster Recombinant(Shingrix) 03/10/2018, 06/15/2018   Zoster, Live 03/28/2010, 12/17/2011, 03/10/2018, 10/07/2023    TDAP status: Up to date  Flu Vaccine status: Up to date  Pneumococcal vaccine status: Up to date  Covid-19 vaccine status: Completed vaccines  Qualifies for Shingles Vaccine? Yes   Zostavax completed Yes   Shingrix Completed?: Yes  Screening Tests Health Maintenance  Topic Date Due   COVID-19 Vaccine (9 - Pfizer risk 2025-26 season) 02/20/2025   Medicare Annual Wellness (AWV)  09/01/2025   DTaP/Tdap/Td (5 - Td or Tdap) 08/19/2033   Pneumococcal Vaccine: 50+ Years  Completed   Influenza Vaccine  Completed   Zoster Vaccines- Shingrix  Completed   Meningococcal B Vaccine  Aged Out    Health Maintenance  There are no preventive care reminders to display for this patient.   Colorectal cancer screening: No longer required.   Lung Cancer Screening: (Low Dose CT Chest recommended if Age 70-80 years, 20 pack-year currently smoking OR have quit w/in 15years.) does not qualify.   Lung Cancer Screening Referral: NA  Additional Screening:  Hepatitis C Screening: does not qualify; Completed NA  Vision Screening: Recommended annual ophthalmology exams for early detection of glaucoma and other disorders of the eye. Is the patient up to date with their annual eye exam?  No  Who is the provider or what is the name of the office in which the patient attends annual eye exams? [facility] If pt is not  established with a provider, would they like to be referred to a provider to establish care? No .   Dental Screening: Recommended annual dental exams for proper oral hygiene  Diabetic Foot Exam:NA  Community Resource Referral / Chronic Care Management: CRR required this visit?  No   CCM required this visit?  No     Plan:     I have personally reviewed and noted the following in the patient's chart:   Medical and social history Use of alcohol , tobacco or illicit drugs  Current medications and supplements including opioid prescriptions. Patient is not currently taking opioid prescriptions. Functional ability and status Nutritional status Physical activity Advanced directives List of other physicians Hospitalizations, surgeries, and ER visits in previous 12 months Vitals Screenings to include cognitive, depression, and falls Referrals and appointments  In addition, I have reviewed and discussed with patient certain preventive protocols, quality metrics, and best practice recommendations. A written personalized care plan for preventive services as well as general preventive health recommendations were provided to patient.     Tawni America, NP   09/01/2024   After Visit Summary: (In Person-Printed) AVS printed and given to the patient  Nurse Notes: NA

## 2024-09-02 DIAGNOSIS — F01A0 Vascular dementia, mild, without behavioral disturbance, psychotic disturbance, mood disturbance, and anxiety: Secondary | ICD-10-CM | POA: Diagnosis not present

## 2024-09-02 DIAGNOSIS — R41841 Cognitive communication deficit: Secondary | ICD-10-CM | POA: Diagnosis not present

## 2024-09-02 DIAGNOSIS — Z9181 History of falling: Secondary | ICD-10-CM | POA: Diagnosis not present

## 2024-09-02 DIAGNOSIS — R278 Other lack of coordination: Secondary | ICD-10-CM | POA: Diagnosis not present

## 2024-09-02 DIAGNOSIS — R2689 Other abnormalities of gait and mobility: Secondary | ICD-10-CM | POA: Diagnosis not present

## 2024-09-05 DIAGNOSIS — R41841 Cognitive communication deficit: Secondary | ICD-10-CM | POA: Diagnosis not present

## 2024-09-05 DIAGNOSIS — F01A0 Vascular dementia, mild, without behavioral disturbance, psychotic disturbance, mood disturbance, and anxiety: Secondary | ICD-10-CM | POA: Diagnosis not present

## 2024-09-05 DIAGNOSIS — Z9181 History of falling: Secondary | ICD-10-CM | POA: Diagnosis not present

## 2024-09-05 DIAGNOSIS — R278 Other lack of coordination: Secondary | ICD-10-CM | POA: Diagnosis not present

## 2024-09-05 DIAGNOSIS — R2689 Other abnormalities of gait and mobility: Secondary | ICD-10-CM | POA: Diagnosis not present

## 2024-09-06 DIAGNOSIS — R278 Other lack of coordination: Secondary | ICD-10-CM | POA: Diagnosis not present

## 2024-09-06 DIAGNOSIS — R41841 Cognitive communication deficit: Secondary | ICD-10-CM | POA: Diagnosis not present

## 2024-09-06 DIAGNOSIS — R2689 Other abnormalities of gait and mobility: Secondary | ICD-10-CM | POA: Diagnosis not present

## 2024-09-06 DIAGNOSIS — Z9181 History of falling: Secondary | ICD-10-CM | POA: Diagnosis not present

## 2024-09-06 DIAGNOSIS — F01A0 Vascular dementia, mild, without behavioral disturbance, psychotic disturbance, mood disturbance, and anxiety: Secondary | ICD-10-CM | POA: Diagnosis not present

## 2024-09-07 DIAGNOSIS — Z9181 History of falling: Secondary | ICD-10-CM | POA: Diagnosis not present

## 2024-09-07 DIAGNOSIS — F01A0 Vascular dementia, mild, without behavioral disturbance, psychotic disturbance, mood disturbance, and anxiety: Secondary | ICD-10-CM | POA: Diagnosis not present

## 2024-09-07 DIAGNOSIS — R41841 Cognitive communication deficit: Secondary | ICD-10-CM | POA: Diagnosis not present

## 2024-09-07 DIAGNOSIS — R2689 Other abnormalities of gait and mobility: Secondary | ICD-10-CM | POA: Diagnosis not present

## 2024-09-07 DIAGNOSIS — R278 Other lack of coordination: Secondary | ICD-10-CM | POA: Diagnosis not present

## 2024-09-08 DIAGNOSIS — F01A0 Vascular dementia, mild, without behavioral disturbance, psychotic disturbance, mood disturbance, and anxiety: Secondary | ICD-10-CM | POA: Diagnosis not present

## 2024-09-08 DIAGNOSIS — Z9181 History of falling: Secondary | ICD-10-CM | POA: Diagnosis not present

## 2024-09-08 DIAGNOSIS — R278 Other lack of coordination: Secondary | ICD-10-CM | POA: Diagnosis not present

## 2024-09-08 DIAGNOSIS — R2689 Other abnormalities of gait and mobility: Secondary | ICD-10-CM | POA: Diagnosis not present

## 2024-09-08 DIAGNOSIS — R41841 Cognitive communication deficit: Secondary | ICD-10-CM | POA: Diagnosis not present

## 2024-09-09 DIAGNOSIS — R2689 Other abnormalities of gait and mobility: Secondary | ICD-10-CM | POA: Diagnosis not present

## 2024-09-09 DIAGNOSIS — Z9181 History of falling: Secondary | ICD-10-CM | POA: Diagnosis not present

## 2024-09-09 DIAGNOSIS — R41841 Cognitive communication deficit: Secondary | ICD-10-CM | POA: Diagnosis not present

## 2024-09-09 DIAGNOSIS — F01A0 Vascular dementia, mild, without behavioral disturbance, psychotic disturbance, mood disturbance, and anxiety: Secondary | ICD-10-CM | POA: Diagnosis not present

## 2024-09-09 DIAGNOSIS — R278 Other lack of coordination: Secondary | ICD-10-CM | POA: Diagnosis not present

## 2024-09-12 ENCOUNTER — Encounter: Payer: Self-pay | Admitting: Adult Health

## 2024-09-12 ENCOUNTER — Non-Acute Institutional Stay (SKILLED_NURSING_FACILITY): Payer: Self-pay | Admitting: Adult Health

## 2024-09-12 DIAGNOSIS — R001 Bradycardia, unspecified: Secondary | ICD-10-CM

## 2024-09-12 DIAGNOSIS — R2681 Unsteadiness on feet: Secondary | ICD-10-CM

## 2024-09-12 DIAGNOSIS — K769 Liver disease, unspecified: Secondary | ICD-10-CM | POA: Diagnosis not present

## 2024-09-12 DIAGNOSIS — N401 Enlarged prostate with lower urinary tract symptoms: Secondary | ICD-10-CM | POA: Diagnosis not present

## 2024-09-12 DIAGNOSIS — R278 Other lack of coordination: Secondary | ICD-10-CM | POA: Diagnosis not present

## 2024-09-12 DIAGNOSIS — E782 Mixed hyperlipidemia: Secondary | ICD-10-CM

## 2024-09-12 DIAGNOSIS — R41841 Cognitive communication deficit: Secondary | ICD-10-CM | POA: Diagnosis not present

## 2024-09-12 DIAGNOSIS — F0150 Vascular dementia without behavioral disturbance: Secondary | ICD-10-CM

## 2024-09-12 DIAGNOSIS — R3914 Feeling of incomplete bladder emptying: Secondary | ICD-10-CM | POA: Diagnosis not present

## 2024-09-12 DIAGNOSIS — I10 Essential (primary) hypertension: Secondary | ICD-10-CM

## 2024-09-12 DIAGNOSIS — Z9181 History of falling: Secondary | ICD-10-CM | POA: Diagnosis not present

## 2024-09-12 DIAGNOSIS — M170 Bilateral primary osteoarthritis of knee: Secondary | ICD-10-CM | POA: Diagnosis not present

## 2024-09-12 DIAGNOSIS — F01A0 Vascular dementia, mild, without behavioral disturbance, psychotic disturbance, mood disturbance, and anxiety: Secondary | ICD-10-CM | POA: Diagnosis not present

## 2024-09-12 DIAGNOSIS — R2689 Other abnormalities of gait and mobility: Secondary | ICD-10-CM | POA: Diagnosis not present

## 2024-09-12 NOTE — Assessment & Plan Note (Signed)
Controlled. Continue losartan.

## 2024-09-12 NOTE — Assessment & Plan Note (Addendum)
 No current issues Continue Proscar  and finasteride   Follow up with urology for cystoscopy

## 2024-09-12 NOTE — Progress Notes (Signed)
 Location:  [facility] Room Number: 134 P Place of Service:  SNF [phone]) Provider:  Tawni America, NP   Patient Care Team: Charlanne Fredia CROME, MD as PCP - General (Internal Medicine) Thukkani, Arun K, MD as PCP - Cardiology (Cardiology) Cindie Ole DASEN, MD as PCP - Electrophysiology (Cardiology)  Extended Emergency Contact Information Primary Emergency Contact: Flax,BETTY A Address: [address]          [place], [place] [address] [place] Home Phone: [phone] Relation: Spouse Secondary Emergency Contact: Wigger,Cindy Mobile Phone: [phone] Relation: Daughter  Code Status:  DNR Goals of care: Advanced Directive information    09/01/2024    9:56 AM  Advanced Directives  Does Patient Have a Medical Advance Directive? Yes  Type of Advance Directive Living will;Out of facility DNR (pink MOST or yellow form)  Does patient want to make changes to medical advance directive? No - Patient declined     Chief Complaint  Patient presents with   Routine Visit    HPI:  Pt is a 89 y.o. male seen today for medical management of chronic diseases.    Resides in skilled care, retired Sports administrator. Wife lives in the  memory care unit.     He was hospitalized from Apr 05, 2024, to Apr 08, 2024, following a fall without loss of consciousness. During the emergency department visit, he was found to have an irregular rhythm and bradycardia, leading to the placement of a pacemaker on Apr 07, 2024. He is not experiencing palpitations or shortness of breath since the pacemaker placement.  CT of the chest 04/05/24 showed large nodular adenopathy, also questionable low attenuation area in the right lobe of the liver. Follow up CT of the chest/abd 08/02/24 indicated decreased in lesion of the right liver no follow up recommended. Decrease lymph nodes and stable 7 mm left upper lobe pulmonary nodule.   BP 126/64  Pacemaker check 08/19/24 AV dual  paced rhythm 19 AF events Hx afib, not on DOAC due to falls.   He has a history of benign prostatic hyperplasia (BPH) and is scheduled for a cystoscopy on June 10, 2024 due to hematuria and UTI. He returned and the procedure was delayed until the next visit per patient early next year.   He experiences chronic right hip and bilateral knee  pain and is on scheduled Tylenol  twice a day for arthritic pain. Also takes voltaren . Took prednisone  in Sept which helped initially and now says his knee is bothering him more. He is using a WC more for longer distances.   HLD on zetia  and zocor  Lab Results  Component Value Date   LDLCALC 46 11/19/2023    Has gained 10 lbs since July. He reports that he is eating well and more sedentary due to his knees.   Past Medical History:  Diagnosis Date   Arthritis    back and knees   Benign prostatic hyperplasia with incomplete bladder emptying 06/15/2023   Hyperlipidemia 06/15/2023   Mild cognitive impairment 06/15/2023   Primary hypertension 06/15/2023   Primary osteoarthritis of both knees 06/15/2023   Varicose veins    Varicose veins of bilateral lower extremities with other complications 08/03/2013   IMO SNOMED Dx Update Oct 2024     Past Surgical History:  Procedure Laterality Date   INNER EAR SURGERY     PACEMAKER IMPLANT N/A 04/07/2024   Procedure: PACEMAKER IMPLANT;  Surgeon: Cindie Ole DASEN, MD;  Location: [facility];  Service: Cardiovascular;  Laterality: N/A;   SQUAMOUS CELL CARCINOMA EXCISION     on chest    Allergies  Allergen Reactions   Atorvastatin Other (See Comments)    Myalgia   Namenda  [Memantine ] Rash    Outpatient Encounter Medications as of 09/12/2024  Medication Sig   acetaminophen  (TYLENOL ) 500 MG tablet Take 1,000 mg by mouth 2 (two) times daily. May take one additional 1000mg  dose as needed for pain   acetaminophen  (TYLENOL ) 500 MG tablet Take 1,000 mg by mouth daily as needed (Every 24 hours prn for  breakthrough pain).   diclofenac  Sodium (VOLTAREN ) 1 % GEL Apply 4 g topically in the morning, at noon, and at bedtime.   ezetimibe  (ZETIA ) 10 MG tablet Take 10 mg by mouth daily.   finasteride  (PROSCAR ) 5 MG tablet Take 5 mg by mouth in the morning.   fluticasone  (CUTIVATE ) 0.005 % ointment Apply 1 Application topically 2 (two) times daily.   galantamine  (RAZADYNE  ER) 8 MG 24 hr capsule Take 8 mg by mouth daily with breakfast.   losartan  (COZAAR ) 25 MG tablet Take 50 mg by mouth every morning.   menthol-cetylpyridinium (CEPACOL) 3 MG lozenge Take 1 lozenge by mouth every 2 (two) hours as needed for sore throat.   Multiple Vitamins-Minerals (CENTRUM SILVER) CHEW Chew 1 tablet by mouth in the morning.   polyethylene glycol (MIRALAX / GLYCOLAX) 17 g packet Take 17 g by mouth as needed.   simvastatin  (ZOCOR ) 40 MG tablet Take 40 mg by mouth every evening.   tamsulosin  (FLOMAX ) 0.4 MG CAPS capsule Take 0.4 mg by mouth daily.   triamcinolone  ointment (KENALOG ) 0.1 % Apply 1 Application topically 2 (two) times daily.   zinc oxide (ENDIT) 20 % ointment Apply 1 Application topically every 8 (eight) hours as needed for irritation.   ondansetron (ZOFRAN) 4 MG tablet Take 4 mg by mouth every 6 (six) hours as needed for nausea or vomiting. (Patient not taking: Reported on 09/12/2024)   No facility-administered encounter medications on file as of 09/12/2024.    Review of Systems  Constitutional:  Negative for activity change, appetite change, chills, diaphoresis, fatigue and fever.  HENT:  Negative for congestion.   Respiratory:  Negative for cough, shortness of breath and wheezing.   Cardiovascular:  Negative for chest pain and leg swelling.  Gastrointestinal:  Negative for abdominal distention, abdominal pain, constipation, diarrhea, nausea and vomiting.  Genitourinary:  Negative for difficulty urinating, dysuria and urgency.  Musculoskeletal:  Positive for arthralgias (chronic knee pain) and gait  problem. Negative for back pain, myalgias and neck pain.  Skin:  Negative for rash.  Neurological:  Negative for dizziness and weakness.  Psychiatric/Behavioral:  Negative for confusion.        Memory loss    Immunization History  Administered Date(s) Administered   Fluad Trivalent(High Dose 65+) 09/15/2023   INFLUENZA, HIGH DOSE SEASONAL PF 08/15/2015, 07/29/2016, 08/25/2017, 08/23/2024   Influenza Split 03/28/2010, 09/24/2010, 12/17/2011, 02/28/2013, 08/15/2013, 08/23/2014, 08/05/2020   Influenza-Unspecified 08/04/2018, 07/14/2019   Moderna Covid-19 Fall Seasonal Vaccine 31yrs & older 08/23/2024   Moderna Covid-19 Vaccine  Bivalent Booster 25yrs & up 09/15/2023   Moderna SARS-COV2 Booster Vaccination 06/12/2021   PFIZER(Purple Top)SARS-COV-2 Vaccination 08/04/2019, 08/23/2019, 03/13/2020, 09/06/2021   PNEUMOCOCCAL CONJUGATE-20 12/20/2021   Pfizer Covid-19 Vaccine Bivalent Booster 49yrs & up 09/29/2022, 03/19/2023   Pneumococcal Polysaccharide-23 08/18/2007, 03/28/2010, 12/20/2021   Pneumococcal-Unspecified 02/28/2013   RSV,unspecified 11/25/2022   Td (Adult),5 Lf Tetanus Toxid, Preservative Free 09/14/2009   Tdap 09/14/2009,  03/28/2010   Tetanus 08/20/2023   Zoster Recombinant(Shingrix) 03/10/2018, 06/15/2018   Zoster, Live 03/28/2010, 12/17/2011, 03/10/2018, 10/07/2023   Pertinent  Health Maintenance Due  Topic Date Due   Influenza Vaccine  Completed      08/18/2023   10:02 AM 02/23/2024    1:46 PM 09/01/2024    2:04 PM  Fall Risk  Falls in the past year? 1 0 1  Was there an injury with Fall? 1 0 1  Fall Risk Category Calculator 3 0 3  Patient at Risk for Falls Due to History of fall(s);Impaired balance/gait;Impaired mobility Impaired balance/gait;Impaired mobility History of fall(s)  Fall risk Follow up Falls evaluation completed Falls evaluation completed Falls evaluation completed   Functional Status Survey:    Vitals:   09/12/24 1119  BP: 126/64  Pulse: 71  Temp:  97.8 F (36.6 C)  SpO2: 96%  Weight: 208 lb 6.4 oz (94.5 kg)  Height: 5' 10 (1.778 m)   Body mass index is 29.9 kg/m. Physical Exam Vitals and nursing note reviewed.  Constitutional:      Appearance: Normal appearance.  HENT:     Head: Normocephalic and atraumatic.  Cardiovascular:     Rate and Rhythm: Normal rate and regular rhythm.     Heart sounds: No murmur heard. Pulmonary:     Effort: Pulmonary effort is normal. No respiratory distress.     Breath sounds: Normal breath sounds. No wheezing.  Abdominal:     General: Bowel sounds are normal. There is no distension.     Palpations: Abdomen is soft.     Tenderness: There is no abdominal tenderness.  Musculoskeletal:     Cervical back: No rigidity.     Right lower leg: No edema.     Left lower leg: No edema.     Comments: Crepitus to both knees  Lymphadenopathy:     Cervical: No cervical adenopathy.  Skin:    General: Skin is warm and dry.     Findings: Bruising (to both lower ext) present.  Neurological:     General: No focal deficit present.     Mental Status: He is alert and oriented to person, place, and time. Mental status is at baseline.  Psychiatric:        Mood and Affect: Mood normal.     Labs reviewed: Recent Labs    04/05/24 0549 04/05/24 1300 04/07/24 0539 04/12/24 0000 06/09/24 0000 07/19/24 0000  NA 140  --  138 139 139 139  K 4.3  --  4.1 5.1 4.9 4.9  CL 109  --  102 102 101 103  CO2 23  --  25 25* 21 25*  GLUCOSE 127*  --  121*  --   --   --   BUN 17  --  15 18 29* 27*  CREATININE 1.06   < > 1.02 1.0 1.0 1.0  CALCIUM 9.0  --  8.9 9.2 9.4 9.4  MG 2.2  --  2.2  --   --   --   PHOS  --   --  3.4  --   --   --    < > = values in this interval not displayed.   Recent Labs    11/19/23 0000 03/03/24 0000  AST  --  33  ALT  --  39  ALKPHOS  --  64  ALBUMIN 4.3 4.0   Recent Labs    04/05/24 0549 04/05/24 1300 04/07/24 0539 04/12/24 0000  WBC 10.3  9.0 11.2* 7.2  HGB 14.1 14.0 15.9  16.3  HCT 43.5 43.9 47.4 48  MCV 96.0 96.7 92.9  --   PLT 206 208 236 239   Lab Results  Component Value Date   TSH 4.478 04/05/2024   No results found for: HGBA1C Lab Results  Component Value Date   CHOL 116 11/19/2023   HDL 55 11/19/2023   LDLCALC 46 11/19/2023   TRIG 73 11/19/2023    Significant Diagnostic Results in last 30 days:  CUP PACEART REMOTE DEVICE CHECK Result Date: 08/19/2024 PPM Scheduled remote reviewed. Normal device function.  Presenting rhythm: AP/VP 19 AF events, longest duration 11sec, burden >0.1%, OAC contraindicated per PA report Next remote 91 days. LA, CVRS   Assessment/Plan  Vascular dementia without behavioral disturbance (HCC) Skilled level of care Oriented no confusion Short term memory loss Continue galantamine   Primary hypertension Controlled  Continue losartan    Primary osteoarthritis of both knees Chronic knee pain that limits mobility Continue tylenol  and voltaren  Follow up with ortho if further injection desired.   Liver lesion No further imaging recommend per CT report and discussion with Dr Charlanne.   Hyperlipidemia Continue Zetia  and Zocor    Bradycardia S/p pacemaker   Benign prostatic hyperplasia with incomplete bladder emptying No current issues Continue Proscar  and finasteride   Follow up with urology for cystoscopy   Unstable gait Uses a walker and WC Fall risk Fall prec   Labs/tests ordered:  NA

## 2024-09-12 NOTE — Assessment & Plan Note (Signed)
 S/p pacemaker

## 2024-09-12 NOTE — Assessment & Plan Note (Signed)
 No further imaging recommend per CT report and discussion with Dr Charlanne.

## 2024-09-12 NOTE — Assessment & Plan Note (Signed)
 Chronic knee pain that limits mobility Continue tylenol  and voltaren  Follow up with ortho if further injection desired.

## 2024-09-12 NOTE — Assessment & Plan Note (Signed)
 Skilled level of care Oriented no confusion Short term memory loss Continue galantamine 

## 2024-09-12 NOTE — Assessment & Plan Note (Signed)
 Uses a walker and WC Fall risk Fall prec

## 2024-09-12 NOTE — Assessment & Plan Note (Signed)
Continue Zetia and Zocor.

## 2024-09-13 DIAGNOSIS — R41841 Cognitive communication deficit: Secondary | ICD-10-CM | POA: Diagnosis not present

## 2024-09-13 DIAGNOSIS — F01A0 Vascular dementia, mild, without behavioral disturbance, psychotic disturbance, mood disturbance, and anxiety: Secondary | ICD-10-CM | POA: Diagnosis not present

## 2024-09-13 DIAGNOSIS — Z9181 History of falling: Secondary | ICD-10-CM | POA: Diagnosis not present

## 2024-09-13 DIAGNOSIS — R2689 Other abnormalities of gait and mobility: Secondary | ICD-10-CM | POA: Diagnosis not present

## 2024-09-13 DIAGNOSIS — R278 Other lack of coordination: Secondary | ICD-10-CM | POA: Diagnosis not present

## 2024-09-14 DIAGNOSIS — F01A0 Vascular dementia, mild, without behavioral disturbance, psychotic disturbance, mood disturbance, and anxiety: Secondary | ICD-10-CM | POA: Diagnosis not present

## 2024-09-14 DIAGNOSIS — Z9181 History of falling: Secondary | ICD-10-CM | POA: Diagnosis not present

## 2024-09-14 DIAGNOSIS — R2689 Other abnormalities of gait and mobility: Secondary | ICD-10-CM | POA: Diagnosis not present

## 2024-09-14 DIAGNOSIS — R41841 Cognitive communication deficit: Secondary | ICD-10-CM | POA: Diagnosis not present

## 2024-09-14 DIAGNOSIS — R278 Other lack of coordination: Secondary | ICD-10-CM | POA: Diagnosis not present

## 2024-09-15 DIAGNOSIS — F01A0 Vascular dementia, mild, without behavioral disturbance, psychotic disturbance, mood disturbance, and anxiety: Secondary | ICD-10-CM | POA: Diagnosis not present

## 2024-09-15 DIAGNOSIS — R278 Other lack of coordination: Secondary | ICD-10-CM | POA: Diagnosis not present

## 2024-09-15 DIAGNOSIS — Z9181 History of falling: Secondary | ICD-10-CM | POA: Diagnosis not present

## 2024-09-15 DIAGNOSIS — R41841 Cognitive communication deficit: Secondary | ICD-10-CM | POA: Diagnosis not present

## 2024-09-15 DIAGNOSIS — R2689 Other abnormalities of gait and mobility: Secondary | ICD-10-CM | POA: Diagnosis not present

## 2024-09-16 DIAGNOSIS — Z9181 History of falling: Secondary | ICD-10-CM | POA: Diagnosis not present

## 2024-09-16 DIAGNOSIS — R41841 Cognitive communication deficit: Secondary | ICD-10-CM | POA: Diagnosis not present

## 2024-09-16 DIAGNOSIS — R278 Other lack of coordination: Secondary | ICD-10-CM | POA: Diagnosis not present

## 2024-09-16 DIAGNOSIS — F01A0 Vascular dementia, mild, without behavioral disturbance, psychotic disturbance, mood disturbance, and anxiety: Secondary | ICD-10-CM | POA: Diagnosis not present

## 2024-09-16 DIAGNOSIS — R2689 Other abnormalities of gait and mobility: Secondary | ICD-10-CM | POA: Diagnosis not present

## 2024-09-19 DIAGNOSIS — R278 Other lack of coordination: Secondary | ICD-10-CM | POA: Diagnosis not present

## 2024-09-19 DIAGNOSIS — R2689 Other abnormalities of gait and mobility: Secondary | ICD-10-CM | POA: Diagnosis not present

## 2024-09-19 DIAGNOSIS — Z9181 History of falling: Secondary | ICD-10-CM | POA: Diagnosis not present

## 2024-09-19 DIAGNOSIS — F01A0 Vascular dementia, mild, without behavioral disturbance, psychotic disturbance, mood disturbance, and anxiety: Secondary | ICD-10-CM | POA: Diagnosis not present

## 2024-09-19 DIAGNOSIS — R41841 Cognitive communication deficit: Secondary | ICD-10-CM | POA: Diagnosis not present

## 2024-09-20 DIAGNOSIS — R2689 Other abnormalities of gait and mobility: Secondary | ICD-10-CM | POA: Diagnosis not present

## 2024-09-20 DIAGNOSIS — Z9181 History of falling: Secondary | ICD-10-CM | POA: Diagnosis not present

## 2024-09-20 DIAGNOSIS — R41841 Cognitive communication deficit: Secondary | ICD-10-CM | POA: Diagnosis not present

## 2024-09-20 DIAGNOSIS — R278 Other lack of coordination: Secondary | ICD-10-CM | POA: Diagnosis not present

## 2024-09-20 DIAGNOSIS — F01A0 Vascular dementia, mild, without behavioral disturbance, psychotic disturbance, mood disturbance, and anxiety: Secondary | ICD-10-CM | POA: Diagnosis not present

## 2024-09-21 DIAGNOSIS — R278 Other lack of coordination: Secondary | ICD-10-CM | POA: Diagnosis not present

## 2024-09-21 DIAGNOSIS — R41841 Cognitive communication deficit: Secondary | ICD-10-CM | POA: Diagnosis not present

## 2024-09-21 DIAGNOSIS — F01A0 Vascular dementia, mild, without behavioral disturbance, psychotic disturbance, mood disturbance, and anxiety: Secondary | ICD-10-CM | POA: Diagnosis not present

## 2024-09-21 DIAGNOSIS — Z9181 History of falling: Secondary | ICD-10-CM | POA: Diagnosis not present

## 2024-09-21 DIAGNOSIS — R2689 Other abnormalities of gait and mobility: Secondary | ICD-10-CM | POA: Diagnosis not present

## 2024-09-22 DIAGNOSIS — R278 Other lack of coordination: Secondary | ICD-10-CM | POA: Diagnosis not present

## 2024-09-22 DIAGNOSIS — R41841 Cognitive communication deficit: Secondary | ICD-10-CM | POA: Diagnosis not present

## 2024-09-22 DIAGNOSIS — R2689 Other abnormalities of gait and mobility: Secondary | ICD-10-CM | POA: Diagnosis not present

## 2024-09-22 DIAGNOSIS — Z9181 History of falling: Secondary | ICD-10-CM | POA: Diagnosis not present

## 2024-09-22 DIAGNOSIS — F01A0 Vascular dementia, mild, without behavioral disturbance, psychotic disturbance, mood disturbance, and anxiety: Secondary | ICD-10-CM | POA: Diagnosis not present

## 2024-09-23 DIAGNOSIS — R2689 Other abnormalities of gait and mobility: Secondary | ICD-10-CM | POA: Diagnosis not present

## 2024-09-23 DIAGNOSIS — R278 Other lack of coordination: Secondary | ICD-10-CM | POA: Diagnosis not present

## 2024-09-23 DIAGNOSIS — Z9181 History of falling: Secondary | ICD-10-CM | POA: Diagnosis not present

## 2024-09-23 DIAGNOSIS — F01A0 Vascular dementia, mild, without behavioral disturbance, psychotic disturbance, mood disturbance, and anxiety: Secondary | ICD-10-CM | POA: Diagnosis not present

## 2024-09-23 DIAGNOSIS — R41841 Cognitive communication deficit: Secondary | ICD-10-CM | POA: Diagnosis not present

## 2024-09-26 DIAGNOSIS — R2689 Other abnormalities of gait and mobility: Secondary | ICD-10-CM | POA: Diagnosis not present

## 2024-09-26 DIAGNOSIS — R278 Other lack of coordination: Secondary | ICD-10-CM | POA: Diagnosis not present

## 2024-09-26 DIAGNOSIS — R41841 Cognitive communication deficit: Secondary | ICD-10-CM | POA: Diagnosis not present

## 2024-09-26 DIAGNOSIS — Z9181 History of falling: Secondary | ICD-10-CM | POA: Diagnosis not present

## 2024-09-26 DIAGNOSIS — F01A0 Vascular dementia, mild, without behavioral disturbance, psychotic disturbance, mood disturbance, and anxiety: Secondary | ICD-10-CM | POA: Diagnosis not present

## 2024-09-27 DIAGNOSIS — Z9181 History of falling: Secondary | ICD-10-CM | POA: Diagnosis not present

## 2024-09-27 DIAGNOSIS — R278 Other lack of coordination: Secondary | ICD-10-CM | POA: Diagnosis not present

## 2024-09-27 DIAGNOSIS — R2689 Other abnormalities of gait and mobility: Secondary | ICD-10-CM | POA: Diagnosis not present

## 2024-09-27 DIAGNOSIS — R41841 Cognitive communication deficit: Secondary | ICD-10-CM | POA: Diagnosis not present

## 2024-09-27 DIAGNOSIS — F01A0 Vascular dementia, mild, without behavioral disturbance, psychotic disturbance, mood disturbance, and anxiety: Secondary | ICD-10-CM | POA: Diagnosis not present

## 2024-09-28 DIAGNOSIS — R278 Other lack of coordination: Secondary | ICD-10-CM | POA: Diagnosis not present

## 2024-09-28 DIAGNOSIS — F01A0 Vascular dementia, mild, without behavioral disturbance, psychotic disturbance, mood disturbance, and anxiety: Secondary | ICD-10-CM | POA: Diagnosis not present

## 2024-09-28 DIAGNOSIS — R2689 Other abnormalities of gait and mobility: Secondary | ICD-10-CM | POA: Diagnosis not present

## 2024-09-28 DIAGNOSIS — Z9181 History of falling: Secondary | ICD-10-CM | POA: Diagnosis not present

## 2024-09-28 DIAGNOSIS — R41841 Cognitive communication deficit: Secondary | ICD-10-CM | POA: Diagnosis not present

## 2024-09-29 DIAGNOSIS — R278 Other lack of coordination: Secondary | ICD-10-CM | POA: Diagnosis not present

## 2024-09-29 DIAGNOSIS — R41841 Cognitive communication deficit: Secondary | ICD-10-CM | POA: Diagnosis not present

## 2024-09-29 DIAGNOSIS — Z9181 History of falling: Secondary | ICD-10-CM | POA: Diagnosis not present

## 2024-09-29 DIAGNOSIS — F01A0 Vascular dementia, mild, without behavioral disturbance, psychotic disturbance, mood disturbance, and anxiety: Secondary | ICD-10-CM | POA: Diagnosis not present

## 2024-09-29 DIAGNOSIS — R2689 Other abnormalities of gait and mobility: Secondary | ICD-10-CM | POA: Diagnosis not present

## 2024-10-01 DIAGNOSIS — R2689 Other abnormalities of gait and mobility: Secondary | ICD-10-CM | POA: Diagnosis not present

## 2024-10-01 DIAGNOSIS — R278 Other lack of coordination: Secondary | ICD-10-CM | POA: Diagnosis not present

## 2024-10-01 DIAGNOSIS — Z9181 History of falling: Secondary | ICD-10-CM | POA: Diagnosis not present

## 2024-10-01 DIAGNOSIS — R41841 Cognitive communication deficit: Secondary | ICD-10-CM | POA: Diagnosis not present

## 2024-10-01 DIAGNOSIS — F01A0 Vascular dementia, mild, without behavioral disturbance, psychotic disturbance, mood disturbance, and anxiety: Secondary | ICD-10-CM | POA: Diagnosis not present

## 2024-10-03 DIAGNOSIS — R278 Other lack of coordination: Secondary | ICD-10-CM | POA: Diagnosis not present

## 2024-10-03 DIAGNOSIS — R41841 Cognitive communication deficit: Secondary | ICD-10-CM | POA: Diagnosis not present

## 2024-10-03 DIAGNOSIS — F01A0 Vascular dementia, mild, without behavioral disturbance, psychotic disturbance, mood disturbance, and anxiety: Secondary | ICD-10-CM | POA: Diagnosis not present

## 2024-10-03 DIAGNOSIS — R2689 Other abnormalities of gait and mobility: Secondary | ICD-10-CM | POA: Diagnosis not present

## 2024-10-03 DIAGNOSIS — Z9181 History of falling: Secondary | ICD-10-CM | POA: Diagnosis not present

## 2024-10-04 DIAGNOSIS — R278 Other lack of coordination: Secondary | ICD-10-CM | POA: Diagnosis not present

## 2024-10-04 DIAGNOSIS — R41841 Cognitive communication deficit: Secondary | ICD-10-CM | POA: Diagnosis not present

## 2024-10-04 DIAGNOSIS — Z9181 History of falling: Secondary | ICD-10-CM | POA: Diagnosis not present

## 2024-10-04 DIAGNOSIS — F01A0 Vascular dementia, mild, without behavioral disturbance, psychotic disturbance, mood disturbance, and anxiety: Secondary | ICD-10-CM | POA: Diagnosis not present

## 2024-10-04 DIAGNOSIS — R2689 Other abnormalities of gait and mobility: Secondary | ICD-10-CM | POA: Diagnosis not present

## 2024-10-05 DIAGNOSIS — F01A0 Vascular dementia, mild, without behavioral disturbance, psychotic disturbance, mood disturbance, and anxiety: Secondary | ICD-10-CM | POA: Diagnosis not present

## 2024-10-05 DIAGNOSIS — R278 Other lack of coordination: Secondary | ICD-10-CM | POA: Diagnosis not present

## 2024-10-05 DIAGNOSIS — Z9181 History of falling: Secondary | ICD-10-CM | POA: Diagnosis not present

## 2024-10-05 DIAGNOSIS — R2689 Other abnormalities of gait and mobility: Secondary | ICD-10-CM | POA: Diagnosis not present

## 2024-10-05 DIAGNOSIS — R41841 Cognitive communication deficit: Secondary | ICD-10-CM | POA: Diagnosis not present

## 2024-10-06 DIAGNOSIS — F01A0 Vascular dementia, mild, without behavioral disturbance, psychotic disturbance, mood disturbance, and anxiety: Secondary | ICD-10-CM | POA: Diagnosis not present

## 2024-10-06 DIAGNOSIS — R278 Other lack of coordination: Secondary | ICD-10-CM | POA: Diagnosis not present

## 2024-10-06 DIAGNOSIS — R41841 Cognitive communication deficit: Secondary | ICD-10-CM | POA: Diagnosis not present

## 2024-10-06 DIAGNOSIS — Z9181 History of falling: Secondary | ICD-10-CM | POA: Diagnosis not present

## 2024-10-06 DIAGNOSIS — R2689 Other abnormalities of gait and mobility: Secondary | ICD-10-CM | POA: Diagnosis not present

## 2024-10-07 DIAGNOSIS — R2689 Other abnormalities of gait and mobility: Secondary | ICD-10-CM | POA: Diagnosis not present

## 2024-10-07 DIAGNOSIS — R278 Other lack of coordination: Secondary | ICD-10-CM | POA: Diagnosis not present

## 2024-10-07 DIAGNOSIS — Z9181 History of falling: Secondary | ICD-10-CM | POA: Diagnosis not present

## 2024-10-07 DIAGNOSIS — R41841 Cognitive communication deficit: Secondary | ICD-10-CM | POA: Diagnosis not present

## 2024-10-07 DIAGNOSIS — F01A0 Vascular dementia, mild, without behavioral disturbance, psychotic disturbance, mood disturbance, and anxiety: Secondary | ICD-10-CM | POA: Diagnosis not present

## 2024-10-10 DIAGNOSIS — R41841 Cognitive communication deficit: Secondary | ICD-10-CM | POA: Diagnosis not present

## 2024-10-10 DIAGNOSIS — Z9181 History of falling: Secondary | ICD-10-CM | POA: Diagnosis not present

## 2024-10-10 DIAGNOSIS — R2689 Other abnormalities of gait and mobility: Secondary | ICD-10-CM | POA: Diagnosis not present

## 2024-10-10 DIAGNOSIS — F01A0 Vascular dementia, mild, without behavioral disturbance, psychotic disturbance, mood disturbance, and anxiety: Secondary | ICD-10-CM | POA: Diagnosis not present

## 2024-10-10 DIAGNOSIS — R278 Other lack of coordination: Secondary | ICD-10-CM | POA: Diagnosis not present

## 2024-10-12 DIAGNOSIS — F01A0 Vascular dementia, mild, without behavioral disturbance, psychotic disturbance, mood disturbance, and anxiety: Secondary | ICD-10-CM | POA: Diagnosis not present

## 2024-10-12 DIAGNOSIS — Z9181 History of falling: Secondary | ICD-10-CM | POA: Diagnosis not present

## 2024-10-12 DIAGNOSIS — R278 Other lack of coordination: Secondary | ICD-10-CM | POA: Diagnosis not present

## 2024-10-12 DIAGNOSIS — R2689 Other abnormalities of gait and mobility: Secondary | ICD-10-CM | POA: Diagnosis not present

## 2024-10-12 DIAGNOSIS — R41841 Cognitive communication deficit: Secondary | ICD-10-CM | POA: Diagnosis not present

## 2024-10-13 DIAGNOSIS — Z9181 History of falling: Secondary | ICD-10-CM | POA: Diagnosis not present

## 2024-10-13 DIAGNOSIS — R2689 Other abnormalities of gait and mobility: Secondary | ICD-10-CM | POA: Diagnosis not present

## 2024-10-13 DIAGNOSIS — R41841 Cognitive communication deficit: Secondary | ICD-10-CM | POA: Diagnosis not present

## 2024-10-13 DIAGNOSIS — R278 Other lack of coordination: Secondary | ICD-10-CM | POA: Diagnosis not present

## 2024-10-13 DIAGNOSIS — F01A0 Vascular dementia, mild, without behavioral disturbance, psychotic disturbance, mood disturbance, and anxiety: Secondary | ICD-10-CM | POA: Diagnosis not present

## 2024-10-14 ENCOUNTER — Non-Acute Institutional Stay (SKILLED_NURSING_FACILITY): Payer: Self-pay | Admitting: Adult Health

## 2024-10-14 ENCOUNTER — Encounter: Payer: Self-pay | Admitting: Adult Health

## 2024-10-14 DIAGNOSIS — F0150 Vascular dementia without behavioral disturbance: Secondary | ICD-10-CM

## 2024-10-14 DIAGNOSIS — R3914 Feeling of incomplete bladder emptying: Secondary | ICD-10-CM | POA: Diagnosis not present

## 2024-10-14 DIAGNOSIS — N401 Enlarged prostate with lower urinary tract symptoms: Secondary | ICD-10-CM

## 2024-10-14 DIAGNOSIS — I10 Essential (primary) hypertension: Secondary | ICD-10-CM | POA: Diagnosis not present

## 2024-10-14 DIAGNOSIS — M170 Bilateral primary osteoarthritis of knee: Secondary | ICD-10-CM

## 2024-10-14 DIAGNOSIS — R001 Bradycardia, unspecified: Secondary | ICD-10-CM | POA: Diagnosis not present

## 2024-10-14 DIAGNOSIS — F01A0 Vascular dementia, mild, without behavioral disturbance, psychotic disturbance, mood disturbance, and anxiety: Secondary | ICD-10-CM | POA: Diagnosis not present

## 2024-10-14 DIAGNOSIS — R2689 Other abnormalities of gait and mobility: Secondary | ICD-10-CM | POA: Diagnosis not present

## 2024-10-14 DIAGNOSIS — R41841 Cognitive communication deficit: Secondary | ICD-10-CM | POA: Diagnosis not present

## 2024-10-14 DIAGNOSIS — Z9181 History of falling: Secondary | ICD-10-CM | POA: Diagnosis not present

## 2024-10-14 DIAGNOSIS — R635 Abnormal weight gain: Secondary | ICD-10-CM | POA: Diagnosis not present

## 2024-10-14 DIAGNOSIS — R278 Other lack of coordination: Secondary | ICD-10-CM | POA: Diagnosis not present

## 2024-10-14 NOTE — Progress Notes (Unsigned)
 Location:  [facility] Room Number: 134 P Place of Service:  SNF [phone]) Provider:  Tawni America, NP   Patient Care Team: Charlanne Fredia CROME, MD as PCP - General (Internal Medicine) Thukkani, Arun K, MD as PCP - Cardiology (Cardiology) Cindie Ole DASEN, MD as PCP - Electrophysiology (Cardiology)  Extended Emergency Contact Information Primary Emergency Contact: Folz,BETTY A Address: [address]          [place], [place] [address] [place] Home Phone: [phone] Relation: Spouse Secondary Emergency Contact: Lasseigne,Cindy Mobile Phone: [phone] Relation: Daughter  Code Status:  DNR Goals of care: Advanced Directive information    09/01/2024    9:56 AM  Advanced Directives  Does Patient Have a Medical Advance Directive? Yes  Type of Advance Directive Living will;Out of facility DNR (pink MOST or yellow form)  Does patient want to make changes to medical advance directive? No - Patient declined     Chief Complaint  Patient presents with   Routine Visit    HPI:  Pt is a 89 y.o. male seen today for medical management of chronic diseases.    Resides in skilled care, retired sports administrator. Wife lives in the memory care unit.   CT of the chest 04/05/24 showed large nodular adenopathy, also questionable low attenuation area in the right lobe of the liver. Follow up CT of the chest/abd 08/02/24 indicated decreased in lesion of the right liver no follow up recommended. Decrease lymph nodes and stable 7 mm left upper lobe pulmonary nodule.   BP Controlled   Pacemaker check 08/19/24 AV dual paced rhythm 19 AF events Hx afib, not on DOAC due to falls.   Benign prostatic hyperplasia (BPH): no current symptoms, followed by urology   He experiences chronic right hip and bilateral knee  pain and is on scheduled Tylenol  twice a day for arthritic pain as well as voltaren  Walks with a walker, uses a WC for longer distances.   HLD on  zetia  and zocor  Lab Results  Component Value Date   LDLCALC 46 11/19/2023   Has gained 8 lbs in the past few months,reports he is trying to eat less desserts.   He has a wound that is healing to the LLE.   Past Medical History:  Diagnosis Date   Arthritis    back and knees   Benign prostatic hyperplasia with incomplete bladder emptying 06/15/2023   Hyperlipidemia 06/15/2023   Mild cognitive impairment 06/15/2023   Primary hypertension 06/15/2023   Primary osteoarthritis of both knees 06/15/2023   Varicose veins    Varicose veins of bilateral lower extremities with other complications 08/03/2013   IMO SNOMED Dx Update Oct 2024     Past Surgical History:  Procedure Laterality Date   INNER EAR SURGERY     PACEMAKER IMPLANT N/A 04/07/2024   Procedure: PACEMAKER IMPLANT;  Surgeon: Cindie Ole DASEN, MD;  Location: [facility];  Service: Cardiovascular;  Laterality: N/A;   SQUAMOUS CELL CARCINOMA EXCISION     on chest    Allergies  Allergen Reactions   Atorvastatin Other (See Comments)    Myalgia   Namenda  [Memantine ] Rash    Outpatient Encounter Medications as of 10/14/2024  Medication Sig   acetaminophen  (TYLENOL ) 500 MG tablet Take 1,000 mg by mouth 2 (two) times daily. May take one additional 1000mg  dose as needed for pain   acetaminophen  (TYLENOL ) 500 MG tablet Take 1,000 mg by mouth daily as needed (Every 24 hours prn for  breakthrough pain).   diclofenac  Sodium (VOLTAREN ) 1 % GEL Apply 4 g topically in the morning, at noon, and at bedtime.   ezetimibe  (ZETIA ) 10 MG tablet Take 10 mg by mouth daily.   finasteride  (PROSCAR ) 5 MG tablet Take 5 mg by mouth in the morning.   fluticasone  (CUTIVATE ) 0.005 % ointment Apply 1 Application topically 2 (two) times daily.   galantamine  (RAZADYNE  ER) 8 MG 24 hr capsule Take 8 mg by mouth daily with breakfast.   losartan  (COZAAR ) 25 MG tablet Take 50 mg by mouth every morning.   menthol-cetylpyridinium (CEPACOL) 3 MG lozenge  Take 1 lozenge by mouth every 2 (two) hours as needed for sore throat.   Multiple Vitamins-Minerals (CENTRUM SILVER) CHEW Chew 1 tablet by mouth in the morning.   polyethylene glycol (MIRALAX / GLYCOLAX) 17 g packet Take 17 g by mouth as needed.   simvastatin  (ZOCOR ) 40 MG tablet Take 40 mg by mouth every evening.   tamsulosin  (FLOMAX ) 0.4 MG CAPS capsule Take 0.4 mg by mouth daily.   triamcinolone  ointment (KENALOG ) 0.1 % Apply 1 Application topically 2 (two) times daily.   zinc oxide (ENDIT) 20 % ointment Apply 1 Application topically every 8 (eight) hours as needed for irritation.   ondansetron (ZOFRAN) 4 MG tablet Take 4 mg by mouth every 6 (six) hours as needed for nausea or vomiting. (Patient not taking: Reported on 09/12/2024)   No facility-administered encounter medications on file as of 10/14/2024.    Review of Systems  Constitutional:  Positive for unexpected weight change. Negative for activity change, appetite change, chills, diaphoresis, fatigue and fever.  HENT:  Negative for congestion.   Respiratory:  Negative for cough, shortness of breath and wheezing.   Cardiovascular:  Negative for chest pain and leg swelling.  Gastrointestinal:  Negative for abdominal distention, abdominal pain, constipation, diarrhea, nausea and vomiting.  Genitourinary:  Negative for difficulty urinating, dysuria and urgency.  Musculoskeletal:  Positive for arthralgias and gait problem. Negative for back pain, myalgias and neck pain.  Skin:  Positive for wound. Negative for rash.  Neurological:  Negative for dizziness and weakness.  Psychiatric/Behavioral:  Negative for confusion.     Immunization History  Administered Date(s) Administered   Fluad Trivalent(High Dose 65+) 09/15/2023   INFLUENZA, HIGH DOSE SEASONAL PF 08/15/2015, 07/29/2016, 08/25/2017, 08/23/2024   Influenza Split 03/28/2010, 09/24/2010, 12/17/2011, 02/28/2013, 08/15/2013, 08/23/2014, 08/05/2020   Influenza-Unspecified 08/04/2018,  07/14/2019   Moderna Covid-19 Fall Seasonal Vaccine 22yrs & older 08/23/2024   Moderna Covid-19 Vaccine  Bivalent Booster 67yrs & up 09/15/2023   Moderna SARS-COV2 Booster Vaccination 06/12/2021   PFIZER(Purple Top)SARS-COV-2 Vaccination 08/04/2019, 08/23/2019, 03/13/2020, 09/06/2021   PNEUMOCOCCAL CONJUGATE-20 12/20/2021   Pfizer Covid-19 Vaccine Bivalent Booster 109yrs & up 09/29/2022, 03/19/2023   Pneumococcal Polysaccharide-23 08/18/2007, 03/28/2010, 12/20/2021   Pneumococcal-Unspecified 02/28/2013   RSV,unspecified 11/25/2022   Td (Adult),5 Lf Tetanus Toxid, Preservative Free 09/14/2009   Tdap 09/14/2009, 03/28/2010   Tetanus 08/20/2023   Zoster Recombinant(Shingrix) 03/10/2018, 06/15/2018   Zoster, Live 03/28/2010, 12/17/2011, 03/10/2018, 10/07/2023   Pertinent  Health Maintenance Due  Topic Date Due   Influenza Vaccine  Completed      08/18/2023   10:02 AM 02/23/2024    1:46 PM 09/01/2024    2:04 PM  Fall Risk  Falls in the past year? 1 0 1  Was there an injury with Fall? 1 0 1  Fall Risk Category Calculator 3 0 3  Patient at Risk for Falls Due to History of fall(s);Impaired balance/gait;Impaired  mobility Impaired balance/gait;Impaired mobility History of fall(s)  Fall risk Follow up Falls evaluation completed Falls evaluation completed Falls evaluation completed   Functional Status Survey:    Vitals:   10/14/24 1001  BP: 131/76  Pulse: 74  Resp: 20  Temp: (!) 97.3 F (36.3 C)  SpO2: 98%  Weight: 212 lb 6.4 oz (96.3 kg)  Height: 5' 10 (1.778 m)   Body mass index is 30.48 kg/m. Physical Exam Vitals and nursing note reviewed.  Constitutional:      Appearance: Normal appearance.  HENT:     Head: Normocephalic and atraumatic.  Cardiovascular:     Rate and Rhythm: Normal rate and regular rhythm.     Heart sounds: No murmur heard. Pulmonary:     Effort: Pulmonary effort is normal. No respiratory distress.     Breath sounds: Normal breath sounds. No wheezing.   Abdominal:     General: Bowel sounds are normal. There is no distension.     Palpations: Abdomen is soft.     Tenderness: There is no abdominal tenderness.  Musculoskeletal:     Cervical back: No rigidity.     Right lower leg: No edema.     Left lower leg: No edema.  Lymphadenopathy:     Cervical: No cervical adenopathy.  Skin:    General: Skin is warm and dry.     Comments: Healing circular wound to left calf with minimal erythema and no purulent matter  Neurological:     General: No focal deficit present.     Mental Status: He is alert. Mental status is at baseline.  Psychiatric:        Mood and Affect: Mood normal.     Labs reviewed: Recent Labs    04/05/24 0549 04/05/24 1300 04/07/24 0539 04/12/24 0000 06/09/24 0000 07/19/24 0000  NA 140  --  138 139 139 139  K 4.3  --  4.1 5.1 4.9 4.9  CL 109  --  102 102 101 103  CO2 23  --  25 25* 21 25*  GLUCOSE 127*  --  121*  --   --   --   BUN 17  --  15 18 29* 27*  CREATININE 1.06   < > 1.02 1.0 1.0 1.0  CALCIUM 9.0  --  8.9 9.2 9.4 9.4  MG 2.2  --  2.2  --   --   --   PHOS  --   --  3.4  --   --   --    < > = values in this interval not displayed.   Recent Labs    11/19/23 0000 03/03/24 0000  AST  --  33  ALT  --  39  ALKPHOS  --  64  ALBUMIN 4.3 4.0   Recent Labs    04/05/24 0549 04/05/24 1300 04/07/24 0539 04/12/24 0000  WBC 10.3 9.0 11.2* 7.2  HGB 14.1 14.0 15.9 16.3  HCT 43.5 43.9 47.4 48  MCV 96.0 96.7 92.9  --   PLT 206 208 236 239   Lab Results  Component Value Date   TSH 4.478 04/05/2024   No results found for: HGBA1C Lab Results  Component Value Date   CHOL 116 11/19/2023   HDL 55 11/19/2023   LDLCALC 46 11/19/2023   TRIG 73 11/19/2023    Significant Diagnostic Results in last 30 days:  No results found.  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    Family/ staff Communication: resident   Labs/tests ordered:  CBC BMP lipid in 1 week

## 2024-10-17 ENCOUNTER — Encounter: Payer: Self-pay | Admitting: Adult Health

## 2024-10-17 DIAGNOSIS — R2689 Other abnormalities of gait and mobility: Secondary | ICD-10-CM | POA: Diagnosis not present

## 2024-10-17 DIAGNOSIS — R41841 Cognitive communication deficit: Secondary | ICD-10-CM | POA: Diagnosis not present

## 2024-10-17 DIAGNOSIS — R635 Abnormal weight gain: Secondary | ICD-10-CM | POA: Insufficient documentation

## 2024-10-17 DIAGNOSIS — R278 Other lack of coordination: Secondary | ICD-10-CM | POA: Diagnosis not present

## 2024-10-17 DIAGNOSIS — F01A0 Vascular dementia, mild, without behavioral disturbance, psychotic disturbance, mood disturbance, and anxiety: Secondary | ICD-10-CM | POA: Diagnosis not present

## 2024-10-17 DIAGNOSIS — Z9181 History of falling: Secondary | ICD-10-CM | POA: Diagnosis not present

## 2024-10-17 NOTE — Assessment & Plan Note (Signed)
 Recommend reduced portions and physical activity as able.

## 2024-10-17 NOTE — Assessment & Plan Note (Signed)
 Chronic knee pain that limits mobility Continue tylenol  and voltaren 

## 2024-10-17 NOTE — Assessment & Plan Note (Signed)
 Skilled level of care Oriented no confusion Short term memory loss Continue galantamine 

## 2024-10-17 NOTE — Assessment & Plan Note (Signed)
 S/p pacemaker 04/07/24

## 2024-10-17 NOTE — Assessment & Plan Note (Signed)
Controlled. Continue losartan.

## 2024-10-17 NOTE — Assessment & Plan Note (Signed)
 No current issues Continue Proscar  and finasteride   Follow up with urology for cystoscopy

## 2024-10-19 DIAGNOSIS — R41841 Cognitive communication deficit: Secondary | ICD-10-CM | POA: Diagnosis not present

## 2024-10-19 DIAGNOSIS — R278 Other lack of coordination: Secondary | ICD-10-CM | POA: Diagnosis not present

## 2024-10-19 DIAGNOSIS — R2689 Other abnormalities of gait and mobility: Secondary | ICD-10-CM | POA: Diagnosis not present

## 2024-10-19 DIAGNOSIS — F01A0 Vascular dementia, mild, without behavioral disturbance, psychotic disturbance, mood disturbance, and anxiety: Secondary | ICD-10-CM | POA: Diagnosis not present

## 2024-10-19 DIAGNOSIS — Z9181 History of falling: Secondary | ICD-10-CM | POA: Diagnosis not present

## 2024-10-24 DIAGNOSIS — R310 Gross hematuria: Secondary | ICD-10-CM | POA: Diagnosis not present

## 2024-10-24 DIAGNOSIS — Z79899 Other long term (current) drug therapy: Secondary | ICD-10-CM | POA: Diagnosis not present

## 2024-10-24 DIAGNOSIS — E785 Hyperlipidemia, unspecified: Secondary | ICD-10-CM | POA: Diagnosis not present

## 2024-10-24 DIAGNOSIS — N390 Urinary tract infection, site not specified: Secondary | ICD-10-CM | POA: Diagnosis not present

## 2024-10-24 LAB — CBC: RBC: 4.24 (ref 3.87–5.11)

## 2024-10-24 LAB — BASIC METABOLIC PANEL WITH GFR
BUN: 21 (ref 4–21)
CO2: 26 — AB (ref 13–22)
Chloride: 104 (ref 99–108)
Creatinine: 1 (ref 0.6–1.3)
Glucose: 113
Potassium: 4.8 meq/L (ref 3.5–5.1)
Sodium: 138 (ref 137–147)

## 2024-10-24 LAB — COMPREHENSIVE METABOLIC PANEL WITH GFR
Calcium: 8.8 (ref 8.7–10.7)
eGFR: 75

## 2024-10-24 LAB — CBC AND DIFFERENTIAL
HCT: 40 — AB (ref 41–53)
Hemoglobin: 13.6 (ref 13.5–17.5)
Platelets: 219 K/uL (ref 150–400)
WBC: 5.2

## 2024-10-24 LAB — LIPID PANEL
Cholesterol: 106 (ref 0–200)
HDL: 40 (ref 35–70)
LDL Cholesterol: 47
Triglycerides: 96 (ref 40–160)

## 2024-11-07 ENCOUNTER — Encounter: Payer: Self-pay | Admitting: Internal Medicine

## 2024-11-07 ENCOUNTER — Non-Acute Institutional Stay (SKILLED_NURSING_FACILITY): Payer: Self-pay | Admitting: Internal Medicine

## 2024-11-07 DIAGNOSIS — R3914 Feeling of incomplete bladder emptying: Secondary | ICD-10-CM | POA: Diagnosis not present

## 2024-11-07 DIAGNOSIS — Z950 Presence of cardiac pacemaker: Secondary | ICD-10-CM

## 2024-11-07 DIAGNOSIS — N401 Enlarged prostate with lower urinary tract symptoms: Secondary | ICD-10-CM | POA: Diagnosis not present

## 2024-11-07 DIAGNOSIS — I10 Essential (primary) hypertension: Secondary | ICD-10-CM

## 2024-11-07 DIAGNOSIS — F0150 Vascular dementia without behavioral disturbance: Secondary | ICD-10-CM | POA: Diagnosis not present

## 2024-11-07 DIAGNOSIS — E782 Mixed hyperlipidemia: Secondary | ICD-10-CM | POA: Diagnosis not present

## 2024-11-07 NOTE — Progress Notes (Signed)
 Location:  [facility]:  SNF (31)  Provider:   Code Status: DNR Goals of Care:     09/01/2024    9:56 AM  Advanced Directives  Does Patient Have a Medical Advance Directive? Yes  Type of Advance Directive Living will;Out of facility DNR (pink MOST or yellow form)  Does patient want to make changes to medical advance directive? No - Patient declined     Chief Complaint  Patient presents with   Care Management    HPI: Patient is a 89 y.o. male seen today for medical management of chronic diseases.    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  Using Voltaren       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  He is stable Needs help with Getting up from Bed and recliner Trying to work with therapy and learn better way to stand Can walk with the walker   Has some cough in the morning with Mucus  Has gained some weight Wt Readings from Last 3 Encounters:  11/07/24 213 lb (96.6 kg)  10/14/24 212 lb 6.4 oz (96.3 kg)  09/12/24 208 lb 6.4 oz (94.5 kg)     Past Medical History:  Diagnosis Date   Arthritis    back and knees   Benign prostatic hyperplasia with incomplete bladder emptying 06/15/2023   Hyperlipidemia 06/15/2023   Mild cognitive impairment 06/15/2023   Primary hypertension 06/15/2023   Primary osteoarthritis of both knees 06/15/2023   Varicose veins    Varicose veins of bilateral lower extremities with other complications 08/03/2013   IMO SNOMED Dx Update Oct 2024      Past Surgical History:  Procedure Laterality Date   INNER EAR SURGERY     PACEMAKER IMPLANT N/A 04/07/2024   Procedure: PACEMAKER IMPLANT;  Surgeon: Cindie Ole DASEN, MD;  Location: [facility];   Service: Cardiovascular;  Laterality: N/A;   SQUAMOUS CELL CARCINOMA EXCISION     on chest    Allergies[1]  Outpatient Encounter Medications as of 11/07/2024  Medication Sig   acetaminophen  (TYLENOL ) 500 MG tablet Take 1,000 mg by mouth 2 (two) times daily. May take one additional 1000mg  dose as needed for pain   acetaminophen  (TYLENOL ) 500 MG tablet Take 1,000 mg by mouth daily as needed (Every 24 hours prn for breakthrough pain).   diclofenac  Sodium (VOLTAREN ) 1 % GEL Apply 4 g topically in the morning, at noon, and at bedtime.   ezetimibe  (ZETIA ) 10 MG tablet Take 10 mg by mouth daily.   finasteride  (PROSCAR ) 5 MG tablet Take 5 mg by mouth in the morning.   fluticasone  (CUTIVATE ) 0.005 % ointment Apply 1 Application topically 2 (two) times daily.   galantamine  (RAZADYNE  ER) 8 MG 24 hr capsule Take 8 mg by mouth daily with breakfast.   losartan  (COZAAR ) 25 MG tablet Take 50 mg by mouth every morning.   menthol-cetylpyridinium (CEPACOL) 3 MG lozenge Take 1 lozenge by mouth every 2 (two) hours as needed for sore throat.   Multiple Vitamins-Minerals (CENTRUM SILVER) CHEW Chew 1 tablet by mouth in the morning.   polyethylene glycol (MIRALAX / GLYCOLAX) 17 g packet Take 17 g by mouth as  needed.   simvastatin  (ZOCOR ) 40 MG tablet Take 40 mg by mouth every evening.   tamsulosin  (FLOMAX ) 0.4 MG CAPS capsule Take 0.4 mg by mouth daily.   triamcinolone  ointment (KENALOG ) 0.1 % Apply 1 Application topically 2 (two) times daily.   zinc oxide (ENDIT) 20 % ointment Apply 1 Application topically every 8 (eight) hours as needed for irritation.   No facility-administered encounter medications on file as of 11/07/2024.    Review of Systems:  Review of Systems  Constitutional:  Negative for activity change, appetite change and unexpected weight change.  HENT: Negative.    Respiratory:  Negative for cough and shortness of breath.   Cardiovascular:  Positive for leg swelling.  Gastrointestinal:   Negative for constipation.  Genitourinary:  Negative for frequency.  Musculoskeletal:  Positive for gait problem. Negative for arthralgias and myalgias.  Skin: Negative.  Negative for rash.  Neurological:  Negative for dizziness and weakness.  Psychiatric/Behavioral:  Positive for confusion. Negative for sleep disturbance.   All other systems reviewed and are negative.   Health Maintenance  Topic Date Due   COVID-19 Vaccine (9 - Pfizer risk 2025-26 season) 02/20/2025   Medicare Annual Wellness (AWV)  09/01/2025   DTaP/Tdap/Td (5 - Td or Tdap) 08/19/2033   Pneumococcal Vaccine: 50+ Years  Completed   Influenza Vaccine  Completed   Zoster Vaccines- Shingrix  Completed   Meningococcal B Vaccine  Aged Out    Physical Exam: Vitals:   11/07/24 1415  BP: 124/73  Pulse: 69  Resp: 20  Temp: (!) 97.3 F (36.3 C)  Weight: 213 lb (96.6 kg)   Body mass index is 30.56 kg/m. Physical Exam Vitals reviewed.  Constitutional:      Appearance: Normal appearance.  HENT:     Head: Normocephalic.     Nose: Nose normal.     Mouth/Throat:     Mouth: Mucous membranes are moist.     Pharynx: Oropharynx is clear.  Eyes:     Pupils: Pupils are equal, round, and reactive to light.  Cardiovascular:     Rate and Rhythm: Normal rate. Rhythm irregular.     Pulses: Normal pulses.     Heart sounds: No murmur heard. Pulmonary:     Effort: Pulmonary effort is normal. No respiratory distress.     Breath sounds: Normal breath sounds. No rales.  Abdominal:     General: Abdomen is flat. Bowel sounds are normal.     Palpations: Abdomen is soft.  Musculoskeletal:        General: Swelling present.     Cervical back: Neck supple.  Skin:    General: Skin is warm.  Neurological:     General: No focal deficit present.     Mental Status: He is alert and oriented to person, place, and time.  Psychiatric:        Mood and Affect: Mood normal.        Thought Content: Thought content normal.     Labs  reviewed: Basic Metabolic Panel: Recent Labs    11/19/23 0000 03/03/24 0000 04/05/24 0549 04/05/24 1300 04/07/24 0539 04/12/24 0000 06/09/24 0000 07/19/24 0000  NA 137   < > 140  --  138 139 139 139  K 4.6   < > 4.3  --  4.1 5.1 4.9 4.9  CL 103   < > 109  --  102 102 101 103  CO2 25*   < > 23  --  25 25* 21 25*  GLUCOSE  --   --  127*  --  121*  --   --   --   BUN 22*   < > 17  --  15 18 29* 27*  CREATININE 1.0   < > 1.06 0.97 1.02 1.0 1.0 1.0  CALCIUM 9.3   < > 9.0  --  8.9 9.2 9.4 9.4  MG  --   --  2.2  --  2.2  --   --   --   PHOS  --   --   --   --  3.4  --   --   --   TSH 3.28  --   --  4.478  --   --   --   --    < > = values in this interval not displayed.   Liver Function Tests: Recent Labs    11/19/23 0000 03/03/24 0000  AST  --  33  ALT  --  39  ALKPHOS  --  64  ALBUMIN 4.3 4.0   No results for input(s): LIPASE, AMYLASE in the last 8760 hours. No results for input(s): AMMONIA in the last 8760 hours. CBC: Recent Labs    04/05/24 0549 04/05/24 1300 04/07/24 0539 04/12/24 0000  WBC 10.3 9.0 11.2* 7.2  HGB 14.1 14.0 15.9 16.3  HCT 43.5 43.9 47.4 48  MCV 96.0 96.7 92.9  --   PLT 206 208 236 239   Lipid Panel: Recent Labs    11/19/23 0000  CHOL 116  HDL 55  LDLCALC 46  TRIG 73   No results found for: HGBA1C  Procedures since last visit: No results found.  Assessment/Plan 1. Primary hypertension (Primary) Cozaar   2. Benign prostatic hyperplasia with incomplete bladder emptying Proscar  and Flomax   3. Vascular dementia without behavioral disturbance (HCC) Doing well in SNF High Functoining On Galantamine   Rash with Namenda   4. Mixed hyperlipidemia On statin LDL 47 5. S/P placement of cardiac pacemaker   Recent Labs were all good   Labs/tests ordered:  * No order type specified * Next appt:  Visit date not found        [1]  Allergies Allergen Reactions   Atorvastatin Other (See Comments)    Myalgia   Namenda   [Memantine ] Rash

## 2024-11-08 ENCOUNTER — Ambulatory Visit: Attending: Internal Medicine

## 2024-11-18 ENCOUNTER — Ambulatory Visit

## 2024-11-18 DIAGNOSIS — I4891 Unspecified atrial fibrillation: Secondary | ICD-10-CM

## 2024-11-18 DIAGNOSIS — I4892 Unspecified atrial flutter: Secondary | ICD-10-CM

## 2024-11-20 LAB — CUP PACEART REMOTE DEVICE CHECK
Battery Remaining Longevity: 140 mo
Battery Voltage: 3.13 V
Brady Statistic RA Percent Paced: 0.03 %
Brady Statistic RV Percent Paced: 98.22 %
Date Time Interrogation Session: 20251225231803
Implantable Lead Connection Status: 753985
Implantable Lead Connection Status: 753985
Implantable Lead Implant Date: 20250515
Implantable Lead Implant Date: 20250515
Implantable Lead Location: 753859
Implantable Lead Location: 753860
Implantable Lead Model: 3830
Implantable Lead Model: 5076
Implantable Pulse Generator Implant Date: 20250515
Lead Channel Impedance Value: 323 Ohm
Lead Channel Impedance Value: 342 Ohm
Lead Channel Impedance Value: 456 Ohm
Lead Channel Impedance Value: 456 Ohm
Lead Channel Pacing Threshold Amplitude: 0.625 V
Lead Channel Pacing Threshold Amplitude: 0.75 V
Lead Channel Pacing Threshold Pulse Width: 0.4 ms
Lead Channel Pacing Threshold Pulse Width: 0.4 ms
Lead Channel Sensing Intrinsic Amplitude: 2 mV
Lead Channel Sensing Intrinsic Amplitude: 2 mV
Lead Channel Sensing Intrinsic Amplitude: 22.5 mV
Lead Channel Sensing Intrinsic Amplitude: 22.5 mV
Lead Channel Setting Pacing Amplitude: 1.5 V
Lead Channel Setting Pacing Amplitude: 2 V
Lead Channel Setting Pacing Pulse Width: 0.4 ms
Lead Channel Setting Sensing Sensitivity: 0.9 mV
Zone Setting Status: 755011
Zone Setting Status: 755011

## 2024-11-21 ENCOUNTER — Ambulatory Visit: Payer: Self-pay | Admitting: Cardiology

## 2024-11-21 NOTE — Progress Notes (Signed)
 Remote PPM Transmission

## 2024-11-30 ENCOUNTER — Non-Acute Institutional Stay (SKILLED_NURSING_FACILITY): Admitting: Adult Health

## 2024-11-30 DIAGNOSIS — L97909 Non-pressure chronic ulcer of unspecified part of unspecified lower leg with unspecified severity: Secondary | ICD-10-CM

## 2024-11-30 DIAGNOSIS — I10 Essential (primary) hypertension: Secondary | ICD-10-CM

## 2024-11-30 DIAGNOSIS — I83009 Varicose veins of unspecified lower extremity with ulcer of unspecified site: Secondary | ICD-10-CM | POA: Diagnosis not present

## 2024-11-30 DIAGNOSIS — I872 Venous insufficiency (chronic) (peripheral): Secondary | ICD-10-CM | POA: Diagnosis not present

## 2024-12-02 ENCOUNTER — Encounter: Payer: Self-pay | Admitting: Adult Health

## 2024-12-02 MED ORDER — FUROSEMIDE 20 MG PO TABS: 20.0000 mg | ORAL_TABLET | Freq: Every day | ORAL | Status: DC

## 2024-12-02 MED ORDER — POTASSIUM CHLORIDE CRYS ER 20 MEQ PO TBCR: 20.0000 meq | EXTENDED_RELEASE_TABLET | Freq: Every day | ORAL | Status: DC

## 2024-12-02 NOTE — Progress Notes (Signed)
 " Location:  [facility] Illustrator of Service:  SNF (31) Provider:   Bari America, ANP [facility] [phone]   Charlanne Fredia CROME, MD  Patient Care Team: Charlanne Fredia CROME, MD as PCP - General (Internal Medicine) Wendel Lurena POUR, MD as PCP - Cardiology (Cardiology) Cindie Ole DASEN, MD (Inactive) as PCP - Electrophysiology (Cardiology)  Extended Emergency Contact Information Primary Emergency Contact: Borgwardt,BETTY A Address: [address]          [place], [place] [address] [place] Home Phone: [phone] Relation: Spouse Secondary Emergency Contact: Doner,Cindy Mobile Phone: [phone] Relation: Daughter  Code Status:  DNR Goals of care: Advanced Directive information    09/01/2024    9:56 AM  Advanced Directives  Does Patient Have a Medical Advance Directive? Yes  Type of Advance Directive Living will;Out of facility DNR (pink MOST or yellow form)  Does patient want to make changes to medical advance directive? No - Patient declined     Chief Complaint  Patient presents with   Acute Visit    Weight gain    HPI:  Pt is a 89 y.o. male seen today for an acute visit for weight gain  He resides in skilled care. Retired physician with short term memory loss  The nurse reports he has two ulcers on the left calf with serous drainage and leg weeping. They started as skin tears but now appear like venous ulcers.   He has chronic leg edema and wears compression hose but they are worsening.  Weight gain is an issue. 17 lbs in the past month.  Reports new onset of dyspnea on exertion. No chest pain. No sob at rest.  Has a pacemaker.  2 D echo 04/05/2024 EF 50-55% not able to determine diastolic function. Bilateral atrial enlargement. BNP 829 May 2025 Hx of afib Wt Readings from Last 3 Encounters:  12/02/24 230 lb (104.3 kg)  11/07/24 213 lb (96.6 kg)  10/14/24 212 lb 6.4 oz (96.3 kg)   10/24/24 LDL 47 TC 106  BUN 20.8  Cr 0.98  04/05/24 TSH 4.478  Past Medical History:  Diagnosis Date   Arthritis    back and knees   Benign prostatic hyperplasia with incomplete bladder emptying 06/15/2023   Hyperlipidemia 06/15/2023   Mild cognitive impairment 06/15/2023   Primary hypertension 06/15/2023   Primary osteoarthritis of both knees 06/15/2023   Varicose veins    Varicose veins of bilateral lower extremities with other complications 08/03/2013   IMO SNOMED Dx Update Oct 2024     Past Surgical History:  Procedure Laterality Date   INNER EAR SURGERY     PACEMAKER IMPLANT N/A 04/07/2024   Procedure: PACEMAKER IMPLANT;  Surgeon: Cindie Ole DASEN, MD;  Location: [facility];  Service: Cardiovascular;  Laterality: N/A;   SQUAMOUS CELL CARCINOMA EXCISION     on chest    Allergies[1]  Outpatient Encounter Medications as of 11/30/2024  Medication Sig   furosemide  (LASIX ) 20 MG tablet Take 1 tablet (20 mg total) by mouth daily.   potassium chloride  SA (KLOR-CON  M) 20 MEQ tablet Take 1 tablet (20 mEq total) by mouth daily.   acetaminophen  (TYLENOL ) 500 MG tablet Take 1,000 mg by mouth 2 (two) times daily. May take one additional 1000mg  dose as needed for pain   acetaminophen  (TYLENOL ) 500 MG tablet Take 1,000 mg by mouth daily as needed (Every 24 hours prn for breakthrough pain).   diclofenac  Sodium (VOLTAREN ) 1 %  GEL Apply 4 g topically in the morning, at noon, and at bedtime.   ezetimibe  (ZETIA ) 10 MG tablet Take 10 mg by mouth daily.   finasteride  (PROSCAR ) 5 MG tablet Take 5 mg by mouth in the morning.   fluticasone  (CUTIVATE ) 0.005 % ointment Apply 1 Application topically 2 (two) times daily.   galantamine  (RAZADYNE  ER) 8 MG 24 hr capsule Take 8 mg by mouth daily with breakfast.   losartan  (COZAAR ) 25 MG tablet Take 50 mg by mouth every morning.   menthol-cetylpyridinium (CEPACOL) 3 MG lozenge Take 1 lozenge by mouth every 2 (two) hours as needed for sore throat.   Multiple Vitamins-Minerals  (CENTRUM SILVER) CHEW Chew 1 tablet by mouth in the morning.   polyethylene glycol (MIRALAX / GLYCOLAX) 17 g packet Take 17 g by mouth as needed.   simvastatin  (ZOCOR ) 40 MG tablet Take 40 mg by mouth every evening.   tamsulosin  (FLOMAX ) 0.4 MG CAPS capsule Take 0.4 mg by mouth daily.   triamcinolone  ointment (KENALOG ) 0.1 % Apply 1 Application topically 2 (two) times daily.   zinc oxide (ENDIT) 20 % ointment Apply 1 Application topically every 8 (eight) hours as needed for irritation.   No facility-administered encounter medications on file as of 11/30/2024.    Review of Systems  Constitutional:  Positive for unexpected weight change. Negative for activity change, appetite change, chills, diaphoresis, fatigue and fever.  HENT:  Negative for congestion.   Respiratory:  Positive for shortness of breath (on exertion). Negative for cough and wheezing.   Cardiovascular:  Positive for leg swelling. Negative for chest pain.  Gastrointestinal:  Negative for abdominal distention, abdominal pain, constipation, diarrhea, nausea and vomiting.  Genitourinary:  Negative for difficulty urinating, dysuria and urgency.  Musculoskeletal:  Positive for arthralgias and gait problem. Negative for back pain, myalgias and neck pain.  Skin:  Negative for rash.  Neurological:  Negative for dizziness and weakness.  Psychiatric/Behavioral:  Negative for confusion.        Memory loss    Immunization History  Administered Date(s) Administered   Fluad Trivalent(High Dose 65+) 09/15/2023   INFLUENZA, HIGH DOSE SEASONAL PF 08/15/2015, 07/29/2016, 08/25/2017, 08/23/2024   Influenza Split 03/28/2010, 09/24/2010, 12/17/2011, 02/28/2013, 08/15/2013, 08/23/2014, 08/05/2020   Influenza-Unspecified 08/04/2018, 07/14/2019   Moderna Covid-19 Fall Seasonal Vaccine 76yrs & older 08/23/2024   Moderna Covid-19 Vaccine  Bivalent Booster 43yrs & up 09/15/2023   Moderna SARS-COV2 Booster Vaccination 06/12/2021   PFIZER(Purple  Top)SARS-COV-2 Vaccination 08/04/2019, 08/23/2019, 03/13/2020, 09/06/2021   PNEUMOCOCCAL CONJUGATE-20 12/20/2021   Pfizer Covid-19 Vaccine Bivalent Booster 43yrs & up 09/29/2022, 03/19/2023   Pneumococcal Polysaccharide-23 08/18/2007, 03/28/2010, 12/20/2021   Pneumococcal-Unspecified 02/28/2013   RSV,unspecified 11/25/2022   Td (Adult),5 Lf Tetanus Toxid, Preservative Free 09/14/2009   Tdap 09/14/2009, 03/28/2010   Tetanus 08/20/2023   Zoster Recombinant(Shingrix) 03/10/2018, 06/15/2018   Zoster, Live 03/28/2010, 12/17/2011, 03/10/2018, 10/07/2023   Pertinent  Health Maintenance Due  Topic Date Due   Influenza Vaccine  Completed      08/18/2023   10:02 AM 02/23/2024    1:46 PM 09/01/2024    2:04 PM  Fall Risk  Falls in the past year? 1 0 1  Was there an injury with Fall? 1  0  1   Fall Risk Category Calculator 3 0 3  Patient at Risk for Falls Due to History of fall(s);Impaired balance/gait;Impaired mobility Impaired balance/gait;Impaired mobility History of fall(s)  Fall risk Follow up Falls evaluation completed Falls evaluation completed Falls evaluation completed  Data saved with a previous flowsheet row definition   Functional Status Survey:    Vitals:   12/02/24 0852  BP: 132/79  Pulse: 66  Resp: 18  Temp: 97.7 F (36.5 C)  SpO2: 96%  Weight: 230 lb (104.3 kg)   Body mass index is 33 kg/m. Physical Exam Vitals and nursing note reviewed.  Constitutional:      Appearance: Normal appearance.  HENT:     Head: Normocephalic and atraumatic.     Mouth/Throat:     Mouth: Mucous membranes are moist.     Pharynx: Oropharynx is clear.  Cardiovascular:     Rate and Rhythm: Normal rate and regular rhythm.     Heart sounds: No murmur heard. Pulmonary:     Effort: Pulmonary effort is normal. No respiratory distress.     Breath sounds: Normal breath sounds. No wheezing.  Abdominal:     General: Bowel sounds are normal. There is no distension.     Palpations: Abdomen  is soft.     Tenderness: There is no abdominal tenderness.  Musculoskeletal:     Cervical back: No rigidity.     Right lower leg: Edema (2+) present.     Left lower leg: Edema (2+) present.  Lymphadenopathy:     Cervical: No cervical adenopathy.  Skin:    General: Skin is warm and dry.     Comments: Let later calf are with two ciruclar wounds with red wound bed and serous drainage. Mild erythema surrounding.   Neurological:     General: No focal deficit present.     Mental Status: He is alert and oriented to person, place, and time. Mental status is at baseline.  Psychiatric:        Mood and Affect: Mood normal.     Labs reviewed: Recent Labs    04/05/24 0549 04/05/24 1300 04/07/24 0539 04/12/24 0000 06/09/24 0000 07/19/24 0000  NA 140  --  138 139 139 139  K 4.3  --  4.1 5.1 4.9 4.9  CL 109  --  102 102 101 103  CO2 23  --  25 25* 21 25*  GLUCOSE 127*  --  121*  --   --   --   BUN 17  --  15 18 29* 27*  CREATININE 1.06   < > 1.02 1.0 1.0 1.0  CALCIUM 9.0  --  8.9 9.2 9.4 9.4  MG 2.2  --  2.2  --   --   --   PHOS  --   --  3.4  --   --   --    < > = values in this interval not displayed.   Recent Labs    03/03/24 0000  AST 33  ALT 39  ALKPHOS 64  ALBUMIN 4.0   Recent Labs    04/05/24 0549 04/05/24 1300 04/07/24 0539 04/12/24 0000  WBC 10.3 9.0 11.2* 7.2  HGB 14.1 14.0 15.9 16.3  HCT 43.5 43.9 47.4 48  MCV 96.0 96.7 92.9  --   PLT 206 208 236 239   Lab Results  Component Value Date   TSH 4.478 04/05/2024   No results found for: HGBA1C Lab Results  Component Value Date   CHOL 116 11/19/2023   HDL 55 11/19/2023   LDLCALC 46 11/19/2023   TRIG 73 11/19/2023    Significant Diagnostic Results in last 30 days:  CUP PACEART REMOTE DEVICE CHECK Result Date: 11/20/2024 Pacemaker: Scheduled remote reviewed. Normal device function.  Presenting rhythm: AFL/VP Known persistent AFL since 11/06/2024, OAC contraindicated per EPIC, good V-rate control Next  remote transmission per protocol. ML, CVRS   Assessment/Plan 1. Venous insufficiency (Primary) Venous insuff vs Diastolic CHF Monitor response to lasix  Consider repeat  BNP, follow up with cardiology  - furosemide  (LASIX ) 20 MG tablet; Take 1 tablet (20 mg total) by mouth daily. X 3 d - potassium chloride  SA (KLOR-CON  M) 20 MEQ tablet; Take 1 tablet (20 mEq total) by mouth daily. X 3d  -monitor weights  2. Venous ulcer (HCC) Aquacel AG dressing changes per [facility] Compression hose Leg elevation Reduced salt intake.   3. HTN BP well controlled with losartan .       [1]  Allergies Allergen Reactions   Atorvastatin Other (See Comments)    Myalgia   Namenda  [Memantine ] Rash   "

## 2024-12-13 ENCOUNTER — Encounter: Payer: Self-pay | Admitting: Orthopedic Surgery

## 2024-12-13 ENCOUNTER — Non-Acute Institutional Stay (SKILLED_NURSING_FACILITY): Payer: Self-pay | Admitting: Orthopedic Surgery

## 2024-12-13 DIAGNOSIS — R001 Bradycardia, unspecified: Secondary | ICD-10-CM | POA: Diagnosis not present

## 2024-12-13 DIAGNOSIS — R3914 Feeling of incomplete bladder emptying: Secondary | ICD-10-CM | POA: Diagnosis not present

## 2024-12-13 DIAGNOSIS — R5381 Other malaise: Secondary | ICD-10-CM

## 2024-12-13 DIAGNOSIS — S81812A Laceration without foreign body, left lower leg, initial encounter: Secondary | ICD-10-CM

## 2024-12-13 DIAGNOSIS — I872 Venous insufficiency (chronic) (peripheral): Secondary | ICD-10-CM

## 2024-12-13 DIAGNOSIS — N401 Enlarged prostate with lower urinary tract symptoms: Secondary | ICD-10-CM

## 2024-12-13 DIAGNOSIS — F0150 Vascular dementia without behavioral disturbance: Secondary | ICD-10-CM

## 2024-12-13 DIAGNOSIS — E782 Mixed hyperlipidemia: Secondary | ICD-10-CM | POA: Diagnosis not present

## 2024-12-13 DIAGNOSIS — M170 Bilateral primary osteoarthritis of knee: Secondary | ICD-10-CM | POA: Diagnosis not present

## 2024-12-13 DIAGNOSIS — I10 Essential (primary) hypertension: Secondary | ICD-10-CM | POA: Diagnosis not present

## 2024-12-13 DIAGNOSIS — Z950 Presence of cardiac pacemaker: Secondary | ICD-10-CM | POA: Diagnosis not present

## 2024-12-13 NOTE — Progress Notes (Deleted)
 " Location:      Place of Service:    Provider:  Gil Greig FORBES CARROLYN Charlanne Fredia LITTIE, MD  Patient Care Team: Charlanne Fredia LITTIE, MD as PCP - General (Internal Medicine) Wendel Lurena POUR, MD as PCP - Cardiology (Cardiology) Cindie Ole DASEN, MD (Inactive) as PCP - Electrophysiology (Cardiology)  Extended Emergency Contact Information Primary Emergency Contact: Huebert,BETTY A Address: [address]          [place], [place] [address] [place] Home Phone: [phone] Relation: Spouse Secondary Emergency Contact: Perrow,Cindy Mobile Phone: [phone] Relation: Daughter  Code Status:  DNR Goals of care: Advanced Directive information    12/13/2024   10:49 AM  Advanced Directives  Does Patient Have a Medical Advance Directive? Yes  Type of Estate Agent of [place];Living will;Out of facility DNR (pink MOST or yellow form)  Does patient want to make changes to medical advance directive? No - Patient declined  Copy of Healthcare Power of Attorney in Chart? Yes - validated most recent copy scanned in chart (See row information)  Pre-existing out of facility DNR order (yellow form or pink MOST form) Pink MOST/Yellow Form most recent copy in chart - Physician notified to receive inpatient order     Chief Complaint  Patient presents with   Medical Management of Chronic Issues    Routine visit.    HPI:  Pt is a 89 y.o. male seen today for medical management of chronic diseases.     Past Medical History:  Diagnosis Date   Arthritis    back and knees   Benign prostatic hyperplasia with incomplete bladder emptying 06/15/2023   Hyperlipidemia 06/15/2023   Mild cognitive impairment 06/15/2023   Primary hypertension 06/15/2023   Primary osteoarthritis of both knees 06/15/2023   Varicose veins    Varicose veins of bilateral lower extremities with other complications 08/03/2013   IMO SNOMED Dx Update Oct 2024     Past Surgical History:   Procedure Laterality Date   INNER EAR SURGERY     PACEMAKER IMPLANT N/A 04/07/2024   Procedure: PACEMAKER IMPLANT;  Surgeon: Cindie Ole DASEN, MD;  Location: [facility];  Service: Cardiovascular;  Laterality: N/A;   SQUAMOUS CELL CARCINOMA EXCISION     on chest    Allergies[1]  Outpatient Encounter Medications as of 12/13/2024  Medication Sig   acetaminophen  (TYLENOL ) 500 MG tablet Take 1,000 mg by mouth 2 (two) times daily. May take one additional 1000mg  dose as needed for pain   acetaminophen  (TYLENOL ) 500 MG tablet Take 1,000 mg by mouth daily as needed (Every 24 hours prn for breakthrough pain).   diclofenac  Sodium (VOLTAREN ) 1 % GEL Apply 4 g topically in the morning, at noon, and at bedtime.   ezetimibe  (ZETIA ) 10 MG tablet Take 10 mg by mouth daily.   finasteride  (PROSCAR ) 5 MG tablet Take 5 mg by mouth in the morning.   fluticasone  (CUTIVATE ) 0.005 % ointment Apply 1 Application topically 2 (two) times daily.   galantamine  (RAZADYNE  ER) 8 MG 24 hr capsule Take 8 mg by mouth daily with breakfast.   losartan  (COZAAR ) 25 MG tablet Take 50 mg by mouth every morning.   menthol-cetylpyridinium (CEPACOL) 3 MG lozenge Take 1 lozenge by mouth every 2 (two) hours as needed for sore throat.   Multiple Vitamins-Minerals (CENTRUM SILVER) CHEW Chew 1 tablet by mouth in the morning.   polyethylene glycol (MIRALAX / GLYCOLAX) 17 g packet Take 17 g by mouth as needed.  simvastatin  (ZOCOR ) 40 MG tablet Take 40 mg by mouth every evening.   tamsulosin  (FLOMAX ) 0.4 MG CAPS capsule Take 0.4 mg by mouth daily.   triamcinolone  ointment (KENALOG ) 0.1 % Apply 1 Application topically 2 (two) times daily.   zinc oxide (ENDIT) 20 % ointment Apply 1 Application topically every 8 (eight) hours as needed for irritation.   furosemide  (LASIX ) 20 MG tablet Take 1 tablet (20 mg total) by mouth daily. (Patient not taking: Reported on 12/13/2024)   potassium chloride  SA (KLOR-CON  M) 20  MEQ tablet Take 1 tablet (20 mEq total) by mouth daily. (Patient not taking: Reported on 12/13/2024)   No facility-administered encounter medications on file as of 12/13/2024.    Review of Systems  Immunization History  Administered Date(s) Administered   Fluad Trivalent(High Dose 65+) 09/15/2023   INFLUENZA, HIGH DOSE SEASONAL PF 08/15/2015, 07/29/2016, 08/25/2017, 08/23/2024   Influenza Split 03/28/2010, 09/24/2010, 12/17/2011, 02/28/2013, 08/15/2013, 08/23/2014, 08/05/2020   Influenza-Unspecified 08/04/2018, 07/14/2019   Moderna Covid-19 Fall Seasonal Vaccine 28yrs & older 08/23/2024   Moderna Covid-19 Vaccine  Bivalent Booster 85yrs & up 09/15/2023   Moderna SARS-COV2 Booster Vaccination 06/12/2021   PFIZER(Purple Top)SARS-COV-2 Vaccination 08/04/2019, 08/23/2019, 03/13/2020, 09/06/2021   PNEUMOCOCCAL CONJUGATE-20 12/20/2021   Pfizer Covid-19 Vaccine Bivalent Booster 52yrs & up 09/29/2022, 03/19/2023   Pneumococcal Polysaccharide-23 08/18/2007, 03/28/2010, 12/20/2021   Pneumococcal-Unspecified 02/28/2013   RSV,unspecified 11/25/2022   Td (Adult),5 Lf Tetanus Toxid, Preservative Free 09/14/2009   Tdap 09/14/2009, 03/28/2010   Tetanus 08/20/2023   Zoster Recombinant(Shingrix) 03/10/2018, 06/15/2018   Zoster, Live 03/28/2010, 12/17/2011, 03/10/2018, 10/07/2023   Pertinent  Health Maintenance Due  Topic Date Due   Influenza Vaccine  Completed      08/18/2023   10:02 AM 02/23/2024    1:46 PM 09/01/2024    2:04 PM  Fall Risk  Falls in the past year? 1 0 1  Was there an injury with Fall? 1  0  1   Fall Risk Category Calculator 3 0 3  Patient at Risk for Falls Due to History of fall(s);Impaired balance/gait;Impaired mobility Impaired balance/gait;Impaired mobility History of fall(s)  Fall risk Follow up Falls evaluation completed Falls evaluation completed Falls evaluation completed     Data saved with a previous flowsheet row definition   Functional Status  Survey:    Vitals:   12/13/24 1047  BP: 131/67  Pulse: 62  Resp: 14  Temp: (!) 97.3 F (36.3 C)  SpO2: 92%  Weight: 219 lb 3.2 oz (99.4 kg)  Height: 5' 10 (1.778 m)   Body mass index is 31.45 kg/m. Physical Exam  Labs reviewed: Recent Labs    04/05/24 0549 04/05/24 1300 04/07/24 0539 04/12/24 0000 06/09/24 0000 07/19/24 0000 10/24/24 0000  NA 140  --  138   < > 139 139 138  K 4.3  --  4.1   < > 4.9 4.9 4.8  CL 109  --  102   < > 101 103 104  CO2 23  --  25   < > 21 25* 26*  GLUCOSE 127*  --  121*  --   --   --   --   BUN 17  --  15   < > 29* 27* 21  CREATININE 1.06   < > 1.02   < > 1.0 1.0 1.0  CALCIUM 9.0  --  8.9   < > 9.4 9.4 8.8  MG 2.2  --  2.2  --   --   --   --  PHOS  --   --  3.4  --   --   --   --    < > = values in this interval not displayed.   Recent Labs    03/03/24 0000  AST 33  ALT 39  ALKPHOS 64  ALBUMIN 4.0   Recent Labs    04/05/24 0549 04/05/24 1300 04/07/24 0539 04/12/24 0000 10/24/24 0000  WBC 10.3 9.0 11.2* 7.2 5.2  HGB 14.1 14.0 15.9 16.3 13.6  HCT 43.5 43.9 47.4 48 40*  MCV 96.0 96.7 92.9  --   --   PLT 206 208 236 239 219   Lab Results  Component Value Date   TSH 4.478 04/05/2024   No results found for: HGBA1C Lab Results  Component Value Date   CHOL 106 10/24/2024   HDL 40 10/24/2024   LDLCALC 47 10/24/2024   TRIG 96 10/24/2024    Significant Diagnostic Results in last 30 days:  CUP PACEART REMOTE DEVICE CHECK Result Date: 11/20/2024 Pacemaker: Scheduled remote reviewed. Normal device function.  Presenting rhythm: AFL/VP Known persistent AFL since 11/06/2024, OAC contraindicated per EPIC, good V-rate control Next remote transmission per protocol. ML, CVRS   Assessment/Plan There are no diagnoses linked to this encounter.   Family/ staff Communication: ***  Labs/tests ordered:  ***         [1] Allergies Allergen Reactions   Atorvastatin Other (See Comments)    Myalgia   Namenda  [Memantine ]  Rash  This encounter was created in error - please disregard. "

## 2024-12-13 NOTE — Progress Notes (Signed)
 " Location:  [facility] Room Number: 134 P Place of Service:  SNF [phone]) Provider:  Gil Greig FORBES CARROLYN Charlanne Fredia LITTIE, MD  Patient Care Team: Charlanne Fredia LITTIE, MD as PCP - General (Internal Medicine) Thukkani, Arun K, MD as PCP - Cardiology (Cardiology) Cindie Ole DASEN, MD (Inactive) as PCP - Electrophysiology (Cardiology)  Extended Emergency Contact Information Primary Emergency Contact: Rego,BETTY A Address: [address]          [place], [place] [address] [place] Home Phone: [phone] Relation: Spouse Secondary Emergency Contact: Pigman,Cindy Mobile Phone: [phone] Relation: Daughter  Code Status:  DNR Goals of care: Advanced Directive information    12/13/2024   10:49 AM  Advanced Directives  Does Patient Have a Medical Advance Directive? Yes  Type of Estate Agent of [place];Living will;Out of facility DNR (pink MOST or yellow form)  Does patient want to make changes to medical advance directive? No - Patient declined  Copy of Healthcare Power of Attorney in Chart? Yes - validated most recent copy scanned in chart (See row information)  Pre-existing out of facility DNR order (yellow form or pink MOST form) Pink MOST/Yellow Form most recent copy in chart - Physician notified to receive inpatient order     Chief Complaint  Patient presents with   Medical Management of Chronic Issues    Routine Visit.    HPI:  Pt is a 89 y.o. male seen today for medical management of chronic diseases.    He currently resides on the skilled nursing unit at [facility]. PMH: atrial flutter-fib, HTN, HLD, varicose veins, liver lesion, vascular dementia, OA, BPH and weight gain.   Venous insufficiency- 01/07 weight gain and edema, furosemide /KCL x 3 days, improved lower leg edema Bradycardia- hospitalized 05/13-05/15 new onset atrial fib/flutter with bradycardia, PPM placed 04/07/2024 Pacemaker- see above HTN-  BUN/creat 21/1.0 10/24/2024, remains on losartan  HLD- LDL 47 10/24/2024, remains on simvastatin  and Zetia  Dementia- MMSE 24/30 05/2024, MRI brain noted moderate chronic microvascular ischemic disease and cerebral atrophy, no behaviors, able to perform some ADLs, ambulates with wheelchair, remains on galantamine   BPH- followed by urology, remains on finasteride  and tamsulosin   OA- left knee most bothersome, remains on tylenol  and voltaren  gel  Debility- working with therapy,  LLE skin tear- followed by wound nurse, DOI 11/25/2023, covered with Aquacell Ag and telfa  Very pleasant. Upset he is unable to walk like he used to. Scheduled to see dentist later this month due to 2 fillings that fell out. Denies tooth pain or hot/cold sensitivity. No recent falls or injuries.   Recent weights:  01/20- 218.6 lbs  01/10- 226.8 lbs  01/01- 225.2 lbs  12/01- 213 lbs  11/01- 212.4 lbs   Recent blood pressures:  01/14- 131/67  01/07- 132/79  12/31- 147/86    Past Medical History:  Diagnosis Date   Arthritis    back and knees   Benign prostatic hyperplasia with incomplete bladder emptying 06/15/2023   Hyperlipidemia 06/15/2023   Mild cognitive impairment 06/15/2023   Primary hypertension 06/15/2023   Primary osteoarthritis of both knees 06/15/2023   Varicose veins    Varicose veins of bilateral lower extremities with other complications 08/03/2013   IMO SNOMED Dx Update Oct 2024     Past Surgical History:  Procedure Laterality Date   INNER EAR SURGERY     PACEMAKER IMPLANT N/A 04/07/2024   Procedure: PACEMAKER IMPLANT;  Surgeon: Cindie Ole DASEN, MD;  Location: [facility];  Service: Cardiovascular;  Laterality: N/A;   SQUAMOUS CELL CARCINOMA EXCISION     on chest    Allergies[1]  Outpatient Encounter Medications as of 12/13/2024  Medication Sig   acetaminophen  (TYLENOL ) 500 MG tablet Take 1,000 mg by mouth 2 (two) times daily. May take one additional 1000mg  dose as needed  for pain   acetaminophen  (TYLENOL ) 500 MG tablet Take 1,000 mg by mouth daily as needed (Every 24 hours prn for breakthrough pain).   diclofenac  Sodium (VOLTAREN ) 1 % GEL Apply 4 g topically in the morning, at noon, and at bedtime.   ezetimibe  (ZETIA ) 10 MG tablet Take 10 mg by mouth daily.   finasteride  (PROSCAR ) 5 MG tablet Take 5 mg by mouth in the morning.   fluticasone  (CUTIVATE ) 0.005 % ointment Apply 1 Application topically 2 (two) times daily.   galantamine  (RAZADYNE  ER) 8 MG 24 hr capsule Take 8 mg by mouth daily with breakfast.   losartan  (COZAAR ) 25 MG tablet Take 50 mg by mouth every morning.   menthol-cetylpyridinium (CEPACOL) 3 MG lozenge Take 1 lozenge by mouth every 2 (two) hours as needed for sore throat.   Multiple Vitamins-Minerals (CENTRUM SILVER) CHEW Chew 1 tablet by mouth in the morning.   polyethylene glycol (MIRALAX / GLYCOLAX) 17 g packet Take 17 g by mouth as needed.   simvastatin  (ZOCOR ) 40 MG tablet Take 40 mg by mouth every evening.   tamsulosin  (FLOMAX ) 0.4 MG CAPS capsule Take 0.4 mg by mouth daily.   triamcinolone  ointment (KENALOG ) 0.1 % Apply 1 Application topically 2 (two) times daily.   zinc oxide (ENDIT) 20 % ointment Apply 1 Application topically every 8 (eight) hours as needed for irritation.   furosemide  (LASIX ) 20 MG tablet Take 1 tablet (20 mg total) by mouth daily. (Patient not taking: Reported on 12/13/2024)   potassium chloride  SA (KLOR-CON  M) 20 MEQ tablet Take 1 tablet (20 mEq total) by mouth daily. (Patient not taking: Reported on 12/13/2024)   No facility-administered encounter medications on file as of 12/13/2024.    Review of Systems  Unable to perform ROS: Dementia    Immunization History  Administered Date(s) Administered   Fluad Trivalent(High Dose 65+) 09/15/2023   INFLUENZA, HIGH DOSE SEASONAL PF 08/15/2015, 07/29/2016, 08/25/2017, 08/23/2024   Influenza Split 03/28/2010, 09/24/2010, 12/17/2011, 02/28/2013, 08/15/2013, 08/23/2014,  08/05/2020   Influenza-Unspecified 08/04/2018, 07/14/2019   Moderna Covid-19 Fall Seasonal Vaccine 62yrs & older 08/23/2024   Moderna Covid-19 Vaccine  Bivalent Booster 45yrs & up 09/15/2023   Moderna SARS-COV2 Booster Vaccination 06/12/2021   PFIZER(Purple Top)SARS-COV-2 Vaccination 08/04/2019, 08/23/2019, 03/13/2020, 09/06/2021   PNEUMOCOCCAL CONJUGATE-20 12/20/2021   Pfizer Covid-19 Vaccine Bivalent Booster 27yrs & up 09/29/2022, 03/19/2023   Pneumococcal Polysaccharide-23 08/18/2007, 03/28/2010, 12/20/2021   Pneumococcal-Unspecified 02/28/2013   RSV,unspecified 11/25/2022   Td (Adult),5 Lf Tetanus Toxid, Preservative Free 09/14/2009   Tdap 09/14/2009, 03/28/2010   Tetanus 08/20/2023   Zoster Recombinant(Shingrix) 03/10/2018, 06/15/2018   Zoster, Live 03/28/2010, 12/17/2011, 03/10/2018, 10/07/2023   Pertinent  Health Maintenance Due  Topic Date Due   Influenza Vaccine  Completed      08/18/2023   10:02 AM 02/23/2024    1:46 PM 09/01/2024    2:04 PM  Fall Risk  Falls in the past year? 1 0 1  Was there an injury with Fall? 1  0  1   Fall Risk Category Calculator 3 0 3  Patient at Risk for Falls Due to History of fall(s);Impaired balance/gait;Impaired mobility Impaired balance/gait;Impaired mobility History of fall(s)  Fall risk Follow up Falls evaluation completed Falls evaluation completed Falls evaluation completed     Data saved with a previous flowsheet row definition   Functional Status Survey:    Vitals:   12/13/24 1630  BP: 131/67  Pulse: 62  Resp: 14  Temp: (!) 97.3 F (36.3 C)  SpO2: 92%  Weight: 218 lb 9.6 oz (99.2 kg)  Height: 5' 10 (1.778 m)   Body mass index is 31.37 kg/m. Physical Exam Vitals reviewed.  Constitutional:      General: He is not in acute distress. HENT:     Head: Normocephalic.     Right Ear: There is no impacted cerumen.     Left Ear: There is no impacted cerumen.     Nose: Nose normal.     Mouth/Throat:     Mouth: Mucous  membranes are moist.  Eyes:     General:        Right eye: No discharge.        Left eye: No discharge.  Cardiovascular:     Rate and Rhythm: Normal rate and regular rhythm.     Pulses: Normal pulses.     Heart sounds: Normal heart sounds.  Pulmonary:     Effort: Pulmonary effort is normal.     Breath sounds: Normal breath sounds.  Abdominal:     General: Bowel sounds are normal. There is no distension.     Palpations: Abdomen is soft.     Tenderness: There is no abdominal tenderness.  Musculoskeletal:     Cervical back: Neck supple.     Right lower leg: Edema present.     Left lower leg: Edema present.     Comments: Non pitting, L>R  Skin:    General: Skin is warm.     Capillary Refill: Capillary refill takes less than 2 seconds.     Comments: Senile purpura to extremities, very in size. Approx 1-2 skin tear to left shin/calf, granulation tissue to wound bed, no erythema/tenderness/swelling/odor  Neurological:     General: No focal deficit present.     Mental Status: He is alert. Mental status is at baseline.     Motor: Weakness present.     Gait: Gait abnormal.  Psychiatric:        Mood and Affect: Mood normal.     Labs reviewed: Recent Labs    04/05/24 0549 04/05/24 1300 04/07/24 0539 04/12/24 0000 06/09/24 0000 07/19/24 0000 10/24/24 0000  NA 140  --  138   < > 139 139 138  K 4.3  --  4.1   < > 4.9 4.9 4.8  CL 109  --  102   < > 101 103 104  CO2 23  --  25   < > 21 25* 26*  GLUCOSE 127*  --  121*  --   --   --   --   BUN 17  --  15   < > 29* 27* 21  CREATININE 1.06   < > 1.02   < > 1.0 1.0 1.0  CALCIUM 9.0  --  8.9   < > 9.4 9.4 8.8  MG 2.2  --  2.2  --   --   --   --   PHOS  --   --  3.4  --   --   --   --    < > = values in this interval not displayed.   Recent Labs    03/03/24 0000  AST 33  ALT 39  ALKPHOS 64  ALBUMIN 4.0   Recent Labs    04/05/24 0549 04/05/24 1300 04/07/24 0539 04/12/24 0000 10/24/24 0000  WBC 10.3 9.0 11.2* 7.2 5.2   HGB 14.1 14.0 15.9 16.3 13.6  HCT 43.5 43.9 47.4 48 40*  MCV 96.0 96.7 92.9  --   --   PLT 206 208 236 239 219   Lab Results  Component Value Date   TSH 4.478 04/05/2024   No results found for: HGBA1C Lab Results  Component Value Date   CHOL 106 10/24/2024   HDL 40 10/24/2024   LDLCALC 47 10/24/2024   TRIG 96 10/24/2024    Significant Diagnostic Results in last 30 days:  CUP PACEART REMOTE DEVICE CHECK Result Date: 11/20/2024 Pacemaker: Scheduled remote reviewed. Normal device function.  Presenting rhythm: AFL/VP Known persistent AFL since 11/06/2024, OAC contraindicated per EPIC, good V-rate control Next remote transmission per protocol. ML, CVRS   Assessment/Plan 1. Venous insufficiency (Primary) - recent weight gain and lower leg swelling  - weight and swelling improved with furosemide /KCL  2. Bradycardia - s/p PPM  3. S/P placement of cardiac pacemaker  4. Primary hypertension - controlled, goal < 150/90 - cont losartan   5. Mixed hyperlipidemia - LDL stable with statin and Zetia   6. Vascular dementia without behavioral disturbance (HCC) - MMSE 24/30 - no behaviors  - dependent with ADLs except feeding - weight stable - unstable gait/debility> working with PT - cont skilled nursing   7. Benign prostatic hyperplasia with incomplete bladder emptying - followed by urology - cont finasteride  and tamsulosin   8. Primary osteoarthritis of both knees - stable with tylenol  and voltaren  gel   9. Debility - cont PT/OT - cont skilled nursing   10. Noninfected skin tear of left lower extremity, initial encounter - DOI 11/24/2024 - no infection - followed by wound nurse - cont aquacell Ag and telfa dressing changes     Family/ staff Communication: plan discussed with nurse  Labs/tests ordered:  none        [1]  Allergies Allergen Reactions   Atorvastatin Other (See Comments)    Myalgia   Namenda  [Memantine ] Rash   "

## 2024-12-22 ENCOUNTER — Non-Acute Institutional Stay (SKILLED_NURSING_FACILITY): Payer: Self-pay | Admitting: Adult Health

## 2024-12-22 ENCOUNTER — Encounter: Payer: Self-pay | Admitting: Adult Health

## 2024-12-22 DIAGNOSIS — I872 Venous insufficiency (chronic) (peripheral): Secondary | ICD-10-CM

## 2024-12-22 DIAGNOSIS — T148XXA Other injury of unspecified body region, initial encounter: Secondary | ICD-10-CM | POA: Diagnosis not present

## 2024-12-22 MED ORDER — TRAMADOL HCL 50 MG PO TABS: 25.0000 mg | ORAL_TABLET | Freq: Two times a day (BID) | ORAL | 0 refills | Status: AC | PRN

## 2024-12-22 NOTE — Progress Notes (Signed)
 " Location:  [facility] Room Number: 134 P Place of Service:  SNF ([phone]) Provider: Tawni America, NP   Patient Care Team: Charlanne Fredia CROME, MD as PCP - General (Internal Medicine) Thukkani, Arun K, MD as PCP - Cardiology (Cardiology) Cindie Ole DASEN, MD (Inactive) as PCP - Electrophysiology (Cardiology)  Extended Emergency Contact Information Primary Emergency Contact: Moses,BETTY A Address: [address]          [place], [place] [address] [place] Home Phone: [phone] Relation: Spouse Secondary Emergency Contact: Moses,Cindy Mobile Phone: [phone] Relation: Daughter  Code Status:  DNR Goals of care: Advanced Directive information    12/13/2024   10:49 AM  Advanced Directives  Does Patient Have a Medical Advance Directive? Yes  Type of Estate Agent of [place];Living will;Out of facility DNR (pink MOST or yellow form)  Does patient want to make changes to medical advance directive? No - Patient declined  Copy of Healthcare Power of Attorney in Chart? Yes - validated most recent copy scanned in chart (See row information)  Pre-existing out of facility DNR order (yellow form or pink MOST form) Pink MOST/Yellow Form most recent copy in chart - Physician notified to receive inpatient order     Chief Complaint  Patient presents with   Acute Visit    Wound pain    HPI:  Pt is a 89 y.o. male seen today for an acute visit for wound pain.   He has two ulcers to his left calf area being treated with Aquacel dressing changes per [facility].  They are reporting pain with the dressing changes. Scant drainage. No swelling or redness. Area is tender.  The wounds started out as skin tears but became circular and on the calf area such as with a venous ulcer. He has received lasix  with improvement in swelling.      Past Medical History:  Diagnosis Date   Arthritis    back and knees   Benign  prostatic hyperplasia with incomplete bladder emptying 06/15/2023   Hyperlipidemia 06/15/2023   Mild cognitive impairment 06/15/2023   Primary hypertension 06/15/2023   Primary osteoarthritis of both knees 06/15/2023   Varicose veins    Varicose veins of bilateral lower extremities with other complications 08/03/2013   IMO SNOMED Dx Update Oct 2024     Past Surgical History:  Procedure Laterality Date   INNER EAR SURGERY     PACEMAKER IMPLANT N/A 04/07/2024   Procedure: PACEMAKER IMPLANT;  Surgeon: Cindie Ole DASEN, MD;  Location: [facility];  Service: Cardiovascular;  Laterality: N/A;   SQUAMOUS CELL CARCINOMA EXCISION     on chest    Allergies[1]  Outpatient Encounter Medications as of 12/22/2024  Medication Sig   acetaminophen  (TYLENOL ) 500 MG tablet Take 1,000 mg by mouth 2 (two) times daily. May take one additional 1000mg  dose as needed for pain   acetaminophen  (TYLENOL ) 500 MG tablet Take 1,000 mg by mouth daily as needed (Every 24 hours prn for breakthrough pain).   diclofenac  Sodium (VOLTAREN ) 1 % GEL Apply 4 g topically in the morning, at noon, and at bedtime.   ezetimibe  (ZETIA ) 10 MG tablet Take 10 mg by mouth daily.   finasteride  (PROSCAR ) 5 MG tablet Take 5 mg by mouth in the morning.   fluticasone  (CUTIVATE ) 0.005 % ointment Apply 1 Application topically 2 (two) times daily.   galantamine  (RAZADYNE  ER) 8 MG 24 hr capsule Take 8 mg by mouth daily with  breakfast.   losartan  (COZAAR ) 25 MG tablet Take 50 mg by mouth every morning.   menthol-cetylpyridinium (CEPACOL) 3 MG lozenge Take 1 lozenge by mouth every 2 (two) hours as needed for sore throat.   Multiple Vitamins-Minerals (CENTRUM SILVER) CHEW Chew 1 tablet by mouth in the morning.   polyethylene glycol (MIRALAX / GLYCOLAX) 17 g packet Take 17 g by mouth as needed.   simvastatin  (ZOCOR ) 40 MG tablet Take 40 mg by mouth every evening.   tamsulosin  (FLOMAX ) 0.4 MG CAPS capsule Take 0.4 mg by mouth daily.    triamcinolone  ointment (KENALOG ) 0.1 % Apply 1 Application topically 2 (two) times daily.   zinc oxide (ENDIT) 20 % ointment Apply 1 Application topically every 8 (eight) hours as needed for irritation.   No facility-administered encounter medications on file as of 12/22/2024.    Review of Systems  Constitutional:  Negative for activity change, appetite change, chills, diaphoresis, fatigue, fever and unexpected weight change.  Musculoskeletal:  Positive for gait problem.  Skin:  Positive for wound. Negative for color change, pallor and rash.  Psychiatric/Behavioral:  Negative for confusion.        Memory loss    Immunization History  Administered Date(s) Administered   Fluad Trivalent(High Dose 65+) 09/15/2023   INFLUENZA, HIGH DOSE SEASONAL PF 08/15/2015, 07/29/2016, 08/25/2017, 08/23/2024   Influenza Split 03/28/2010, 09/24/2010, 12/17/2011, 02/28/2013, 08/15/2013, 08/23/2014, 08/05/2020   Influenza-Unspecified 08/04/2018, 07/14/2019   Moderna Covid-19 Fall Seasonal Vaccine 4yrs & older 08/23/2024   Moderna Covid-19 Vaccine  Bivalent Booster 74yrs & up 09/15/2023   Moderna SARS-COV2 Booster Vaccination 06/12/2021   PFIZER(Purple Top)SARS-COV-2 Vaccination 08/04/2019, 08/23/2019, 03/13/2020, 09/06/2021   PNEUMOCOCCAL CONJUGATE-20 12/20/2021   Pfizer Covid-19 Vaccine Bivalent Booster 27yrs & up 09/29/2022, 03/19/2023   Pneumococcal Polysaccharide-23 08/18/2007, 03/28/2010, 12/20/2021   Pneumococcal-Unspecified 02/28/2013   RSV,unspecified 11/25/2022   Td (Adult),5 Lf Tetanus Toxid, Preservative Free 09/14/2009   Tdap 09/14/2009, 03/28/2010   Tetanus 08/20/2023   Zoster Recombinant(Shingrix) 03/10/2018, 06/15/2018   Zoster, Live 03/28/2010, 12/17/2011, 03/10/2018, 10/07/2023   Pertinent  Health Maintenance Due  Topic Date Due   Influenza Vaccine  Completed      08/18/2023   10:02 AM 02/23/2024    1:46 PM 09/01/2024    2:04 PM 12/13/2024    5:14 PM  Fall Risk  Falls in the past  year? 1 0 1 1  Was there an injury with Fall? 1  0  1  0  Fall Risk Category Calculator 3 0 3 1  Patient at Risk for Falls Due to History of fall(s);Impaired balance/gait;Impaired mobility Impaired balance/gait;Impaired mobility History of fall(s) Impaired balance/gait  Fall risk Follow up Falls evaluation completed Falls evaluation completed Falls evaluation completed Falls evaluation completed     Data saved with a previous flowsheet row definition   Functional Status Survey:    Vitals:   12/22/24 1030  BP: 138/76  Pulse: 78  Resp: 17  Temp: 97.9 F (36.6 C)  SpO2: 98%  Weight: 217 lb 12.8 oz (98.8 kg)  Height: 5' 10 (1.778 m)   Body mass index is 31.25 kg/m. Physical Exam Vitals and nursing note reviewed.  Constitutional:      Appearance: Normal appearance.  Musculoskeletal:     Comments: Trace edema not pitting.   Skin:    General: Skin is warm and dry.     Comments: He has two circular wounds on the left lateral calf area with beefy tissue, some mild maceration is noted. Scant yellow clear drainage.  No significant redness or swelling.   Neurological:     Mental Status: He is alert.     Labs reviewed: Recent Labs    04/05/24 0549 04/05/24 1300 04/07/24 0539 04/12/24 0000 06/09/24 0000 07/19/24 0000 10/24/24 0000  NA 140  --  138   < > 139 139 138  K 4.3  --  4.1   < > 4.9 4.9 4.8  CL 109  --  102   < > 101 103 104  CO2 23  --  25   < > 21 25* 26*  GLUCOSE 127*  --  121*  --   --   --   --   BUN 17  --  15   < > 29* 27* 21  CREATININE 1.06   < > 1.02   < > 1.0 1.0 1.0  CALCIUM 9.0  --  8.9   < > 9.4 9.4 8.8  MG 2.2  --  2.2  --   --   --   --   PHOS  --   --  3.4  --   --   --   --    < > = values in this interval not displayed.   Recent Labs    03/03/24 0000  AST 33  ALT 39  ALKPHOS 64  ALBUMIN 4.0   Recent Labs    04/05/24 0549 04/05/24 1300 04/07/24 0539 04/12/24 0000 10/24/24 0000  WBC 10.3 9.0 11.2* 7.2 5.2  HGB 14.1 14.0 15.9 16.3  13.6  HCT 43.5 43.9 47.4 48 40*  MCV 96.0 96.7 92.9  --   --   PLT 206 208 236 239 219   Lab Results  Component Value Date   TSH 4.478 04/05/2024   No results found for: HGBA1C Lab Results  Component Value Date   CHOL 106 10/24/2024   HDL 40 10/24/2024   LDLCALC 47 10/24/2024   TRIG 96 10/24/2024    Significant Diagnostic Results in last 30 days:  No results found.  Assessment/Plan  1. Wound pain (Primary) Add prn tramadol  Favor tylenol  when possible  Wound is not infected Recommend elevation to avoid swelling.  Continue dressing changes per [facility] (aquacel)  2. Venous insuff Improved edema and weight after lasix .      [1]  Allergies Allergen Reactions   Atorvastatin Other (See Comments)    Myalgia   Namenda  [Memantine ] Rash   "

## 2025-02-17 ENCOUNTER — Encounter

## 2025-05-19 ENCOUNTER — Encounter

## 2025-08-18 ENCOUNTER — Encounter

## 2025-11-18 ENCOUNTER — Encounter

## 2026-02-17 ENCOUNTER — Encounter
